# Patient Record
Sex: Female | Born: 1950
Health system: Southern US, Community
[De-identification: ages and names within clinical notes are randomized; demographics above are authoritative.]

## PROBLEM LIST (undated history)

## (undated) DIAGNOSIS — I2699 Other pulmonary embolism without acute cor pulmonale: Secondary | ICD-10-CM

## (undated) DIAGNOSIS — C801 Malignant (primary) neoplasm, unspecified: Secondary | ICD-10-CM

## (undated) DIAGNOSIS — Z9221 Personal history of antineoplastic chemotherapy: Secondary | ICD-10-CM

## (undated) DIAGNOSIS — C50919 Malignant neoplasm of unspecified site of unspecified female breast: Secondary | ICD-10-CM

## (undated) DIAGNOSIS — N200 Calculus of kidney: Secondary | ICD-10-CM

## (undated) DIAGNOSIS — M1990 Unspecified osteoarthritis, unspecified site: Secondary | ICD-10-CM

## (undated) DIAGNOSIS — I10 Essential (primary) hypertension: Secondary | ICD-10-CM

## (undated) DIAGNOSIS — Z923 Personal history of irradiation: Secondary | ICD-10-CM

## (undated) DIAGNOSIS — K219 Gastro-esophageal reflux disease without esophagitis: Secondary | ICD-10-CM

## (undated) DIAGNOSIS — E119 Type 2 diabetes mellitus without complications: Secondary | ICD-10-CM

## (undated) HISTORY — DX: Unspecified osteoarthritis, unspecified site: M19.90

## (undated) HISTORY — DX: Malignant (primary) neoplasm, unspecified: C80.1

## (undated) HISTORY — DX: Calculus of kidney: N20.0

## (undated) HISTORY — DX: Malignant neoplasm of unspecified site of unspecified female breast: C50.919

## (undated) HISTORY — DX: Essential (primary) hypertension: I10

## (undated) HISTORY — DX: Other pulmonary embolism without acute cor pulmonale: I26.99

## (undated) HISTORY — DX: Gastro-esophageal reflux disease without esophagitis: K21.9

---

## 2004-09-21 ENCOUNTER — Other Ambulatory Visit: Payer: Self-pay

## 2004-09-21 ENCOUNTER — Emergency Department: Payer: Self-pay | Admitting: Emergency Medicine

## 2009-09-09 ENCOUNTER — Other Ambulatory Visit: Payer: Self-pay

## 2009-10-10 ENCOUNTER — Ambulatory Visit: Payer: Self-pay | Admitting: Internal Medicine

## 2010-08-30 ENCOUNTER — Other Ambulatory Visit: Payer: Self-pay

## 2011-02-27 ENCOUNTER — Ambulatory Visit: Payer: Self-pay

## 2012-02-28 ENCOUNTER — Other Ambulatory Visit: Payer: Self-pay

## 2012-02-28 LAB — LIPID PANEL
Cholesterol: 168 mg/dL (ref 0–200)
Triglycerides: 68 mg/dL (ref 0–200)
VLDL Cholesterol, Calc: 14 mg/dL (ref 5–40)

## 2012-02-28 LAB — COMPREHENSIVE METABOLIC PANEL
Alkaline Phosphatase: 67 U/L (ref 50–136)
Anion Gap: 7 (ref 7–16)
BUN: 15 mg/dL (ref 7–18)
Bilirubin,Total: 0.6 mg/dL (ref 0.2–1.0)
Calcium, Total: 9.6 mg/dL (ref 8.5–10.1)
Chloride: 106 mmol/L (ref 98–107)
Co2: 29 mmol/L (ref 21–32)
EGFR (African American): >60
EGFR (Non-African Amer.): >60
Glucose: 107 mg/dL — ABNORMAL HIGH (ref 65–99)
SGPT (ALT): 30 U/L (ref 12–78)
Sodium: 142 mmol/L (ref 136–145)

## 2012-02-28 LAB — CBC WITH DIFFERENTIAL/PLATELET
Basophil #: 0 10*3/uL (ref 0.0–0.1)
Basophil %: 1 %
Eosinophil #: 0 10*3/uL (ref 0.0–0.7)
Eosinophil %: 1.2 %
HGB: 14.4 g/dL (ref 12.0–16.0)
Lymphocyte %: 47.8 %
MCH: 30 pg (ref 26.0–34.0)
MCHC: 34.9 g/dL (ref 32.0–36.0)
Monocyte #: 0.3 x10 3/mm (ref 0.2–0.9)
Neutrophil %: 41.7 %
Platelet: 212 10*3/uL (ref 150–440)
RBC: 4.81 10*6/uL (ref 3.80–5.20)

## 2012-02-28 LAB — TSH: Thyroid Stimulating Horm: 1.25 u[IU]/mL

## 2012-03-02 ENCOUNTER — Ambulatory Visit: Payer: Self-pay

## 2012-03-16 ENCOUNTER — Ambulatory Visit: Payer: Self-pay

## 2013-03-22 ENCOUNTER — Ambulatory Visit: Payer: Self-pay | Admitting: Internal Medicine

## 2013-03-25 ENCOUNTER — Other Ambulatory Visit: Payer: Self-pay

## 2013-03-25 LAB — COMPREHENSIVE METABOLIC PANEL
Albumin: 3.8 g/dL (ref 3.4–5.0)
BUN: 16 mg/dL (ref 7–18)
Calcium, Total: 9.3 mg/dL (ref 8.5–10.1)
Chloride: 107 mmol/L (ref 98–107)
Co2: 28 mmol/L (ref 21–32)
Creatinine: 0.95 mg/dL (ref 0.60–1.30)
EGFR (African American): >60
Potassium: 3.5 mmol/L (ref 3.5–5.1)
Sodium: 140 mmol/L (ref 136–145)
Total Protein: 8.2 g/dL (ref 6.4–8.2)

## 2013-03-25 LAB — CBC WITH DIFFERENTIAL/PLATELET
Basophil #: 0 10*3/uL (ref 0.0–0.1)
Basophil %: 0.8 %
Eosinophil #: 0 10*3/uL (ref 0.0–0.7)
HGB: 14.6 g/dL (ref 12.0–16.0)
Lymphocyte #: 2.2 10*3/uL (ref 1.0–3.6)
MCV: 85 fL (ref 80–100)
Monocyte #: 0.4 x10 3/mm (ref 0.2–0.9)
Neutrophil #: 2.1 10*3/uL (ref 1.4–6.5)
Neutrophil %: 43.9 %
RBC: 5.12 10*6/uL (ref 3.80–5.20)
RDW: 13.5 % (ref 11.5–14.5)
WBC: 4.7 10*3/uL (ref 3.6–11.0)

## 2013-03-25 LAB — LIPID PANEL: Ldl Cholesterol, Calc: 110 mg/dL — ABNORMAL HIGH (ref 0–100)

## 2013-03-25 LAB — TSH: Thyroid Stimulating Horm: 0.967 u[IU]/mL

## 2013-03-30 ENCOUNTER — Ambulatory Visit: Payer: Self-pay | Admitting: Internal Medicine

## 2013-04-05 ENCOUNTER — Ambulatory Visit: Payer: Self-pay

## 2013-04-05 HISTORY — PX: BREAST BIOPSY: SHX20

## 2013-04-08 ENCOUNTER — Encounter: Payer: Self-pay | Admitting: General Surgery

## 2013-04-08 ENCOUNTER — Ambulatory Visit (INDEPENDENT_AMBULATORY_CARE_PROVIDER_SITE_OTHER): Payer: 59 | Admitting: General Surgery

## 2013-04-08 VITALS — BP 150/92 | HR 80 | Resp 14 | Ht 61.0 in | Wt 173.0 lb

## 2013-04-08 DIAGNOSIS — C50911 Malignant neoplasm of unspecified site of right female breast: Secondary | ICD-10-CM

## 2013-04-08 DIAGNOSIS — C50919 Malignant neoplasm of unspecified site of unspecified female breast: Secondary | ICD-10-CM

## 2013-04-08 NOTE — Progress Notes (Signed)
Patient ID: Abigail Wheeler, female   DOB: 1951-03-05, 62 y.o.   MRN: [id number]  Chief Complaint  Patient presents with  . Other    mammogram    HPI Abigail Wheeler is a 62 y.o. female. who presents for a breast evaluation. The most recent mammogram was 03-22-13, ultrasound was 03-30-13 and breast biopsy was done on 04-05-13.  Patient does perform regular self breast checks and gets regular mammograms done.  She states she could not feel anything different in the breast during her routine checks. The patient states they called her this morning and said the biopsy came back as cancer.   HPI  Past Medical History  Diagnosis Date  . Hypertension   . GERD (gastroesophageal reflux disease)     Past Surgical History  Procedure Laterality Date  . Cesarean section  1991  . Breast biopsy Right 04-05-13    Family History  Problem Relation Age of Onset  . Lung cancer Father   . Breast cancer Maternal Grandmother 20    Social History History  Substance Use Topics  . Smoking status: Never Smoker   . Smokeless tobacco: Never Used  . Alcohol Use: No    No Known Allergies  Current Outpatient Prescriptions  Medication Sig Dispense Refill  . lisinopril (PRINIVIL,ZESTRIL) 10 MG tablet Take 10 mg by mouth daily.      Marland Kitchen omeprazole (PRILOSEC) 20 MG capsule Take 20 mg by mouth daily.       No current facility-administered medications for this visit.    Review of Systems Review of Systems  Constitutional: Negative.   Respiratory: Negative.   Cardiovascular: Negative.     Blood pressure 150/92, pulse 80, resp. rate 14, height 5\' 1"  (1.549 m), weight 173 lb (78.472 kg).  Physical Exam Physical Exam  Constitutional: She is oriented to person, place, and time. She appears well-developed and well-nourished.  Eyes: Conjunctivae are normal. No scleral icterus.  Neck: Neck supple.  Cardiovascular: Normal rate, regular rhythm and normal heart sounds.   Pulmonary/Chest: Effort normal  and breath sounds normal. Right breast exhibits no inverted nipple, no mass, no nipple discharge, no skin change and no tenderness. Left breast exhibits no inverted nipple, no mass, no nipple discharge, no skin change and no tenderness.  Abdominal: Soft. Normal appearance. There is no hepatosplenomegaly. There is no tenderness. No hernia.  Lymphadenopathy:    She has no cervical adenopathy.    She has no axillary adenopathy.  Neurological: She is alert and oriented to person, place, and time.  Skin: Skin is warm and dry.    Data Reviewed Mammogram reviewed. Path shows invasive mammary CA. Based on mammogram from her biopsy, the clip is 2-3 cm from the spiculated area in right breast uiq.   Assessment    CA right breast. Not clear if the targeted area was actually biopsied. May need localization of clip and the spiculated density and both areas removed.    Plan    Will discuss this with radiologist and advise pt. Brief discussed cancer management. She will be advised once I have additional info from Dr. Roswell Nickel from radiology.        SANKAR,SEEPLAPUTHUR G 04/09/2013, 6:51 AM

## 2013-04-08 NOTE — Patient Instructions (Addendum)
Call office for any new breast issues or concerns. She will be contacted early next week for full discussion on further management

## 2013-04-09 ENCOUNTER — Encounter: Payer: Self-pay | Admitting: General Surgery

## 2013-04-09 DIAGNOSIS — C50919 Malignant neoplasm of unspecified site of unspecified female breast: Secondary | ICD-10-CM | POA: Insufficient documentation

## 2013-04-09 LAB — PATHOLOGY REPORT

## 2013-04-13 ENCOUNTER — Encounter: Payer: Self-pay | Admitting: General Surgery

## 2013-04-13 ENCOUNTER — Ambulatory Visit (INDEPENDENT_AMBULATORY_CARE_PROVIDER_SITE_OTHER): Payer: 59 | Admitting: General Surgery

## 2013-04-13 VITALS — BP 154/80 | HR 82 | Resp 14 | Ht 64.0 in | Wt 174.0 lb

## 2013-04-13 DIAGNOSIS — C50219 Malignant neoplasm of upper-inner quadrant of unspecified female breast: Secondary | ICD-10-CM

## 2013-04-13 DIAGNOSIS — C50211 Malignant neoplasm of upper-inner quadrant of right female breast: Secondary | ICD-10-CM

## 2013-04-13 NOTE — Progress Notes (Signed)
Here today for breast discussion. Right breast biopsy site healed, no signs of infection.  After discussion with Dr. Roswell Nickel from radiology it was decided to get an MRI to assess the actual size of the cancer in her right breast. The ER is pos, PR is neg and Her 2 is neg. Discussed fully with pt. She is claustrophobic but was willing to have it done with mild sedative. Rx given for Valium 5 mg to take before the test.  Patient to be scheduled for a bilateral breast MRI at the [facility]. Their facility will contact patient will date, time, and instructions.

## 2013-04-14 ENCOUNTER — Encounter: Payer: Self-pay | Admitting: General Surgery

## 2013-04-14 NOTE — Patient Instructions (Signed)
Surgery to be scheduled after MRI reviewed.

## 2013-04-19 ENCOUNTER — Telehealth: Payer: Self-pay | Admitting: *Deleted

## 2013-04-19 NOTE — Telephone Encounter (Signed)
Patient was contacted today because the [facility] has not been able to contact the patient. This patient states if she had received a call they did not leave a message.  This patient was given the number to the Breast Center to call and get breast MRI arranged [phone]). She verbalizes understanding.

## 2013-04-22 DIAGNOSIS — Z923 Personal history of irradiation: Secondary | ICD-10-CM

## 2013-04-22 DIAGNOSIS — C50919 Malignant neoplasm of unspecified site of unspecified female breast: Secondary | ICD-10-CM

## 2013-04-22 DIAGNOSIS — Z9221 Personal history of antineoplastic chemotherapy: Secondary | ICD-10-CM

## 2013-04-22 HISTORY — DX: Personal history of irradiation: Z92.3

## 2013-04-22 HISTORY — PX: MASTECTOMY: SHX3

## 2013-04-22 HISTORY — DX: Personal history of antineoplastic chemotherapy: Z92.21

## 2013-04-22 HISTORY — DX: Malignant neoplasm of unspecified site of unspecified female breast: C50.919

## 2013-04-23 ENCOUNTER — Ambulatory Visit
Admission: RE | Admit: 2013-04-23 | Discharge: 2013-04-23 | Disposition: A | Discharge status: Home / Self Care | Payer: 59 | Source: Ambulatory Visit | Attending: General Surgery | Admitting: General Surgery

## 2013-04-23 MED ORDER — GADOBENATE DIMEGLUMINE 529 MG/ML IV SOLN
15.0000 mL | Freq: Once | INTRAVENOUS | Status: AC | PRN
  Administered 2013-04-23: 15 mL via INTRAVENOUS

## 2013-04-27 ENCOUNTER — Telehealth: Payer: Self-pay | Admitting: *Deleted

## 2013-04-27 NOTE — Telephone Encounter (Signed)
Message left for Abigail Wheeler at the [facility].   Dr. Jamal Collin reviewed MRI report and would like for patient to have right breast MRI guided biopsy. Patient is anxious and wishes to proceed ASAP.

## 2013-04-28 ENCOUNTER — Other Ambulatory Visit: Payer: Self-pay | Admitting: General Surgery

## 2013-04-28 ENCOUNTER — Other Ambulatory Visit: Payer: Self-pay

## 2013-04-28 DIAGNOSIS — R928 Other abnormal and inconclusive findings on diagnostic imaging of breast: Secondary | ICD-10-CM

## 2013-04-28 NOTE — Telephone Encounter (Signed)
Per Juliann Pulse at the [facility], she will contact patient and arrange accordingly. The first available date is 05-05-13 if patient can do that day.   Dr. Jamal Collin notified to sign off on order for MRI guided biopsy and post mammogram to confirm clip placement.

## 2013-04-29 NOTE — Telephone Encounter (Signed)
Per Juliann Pulse at the [facility], she has scheduled patient for MRI guided biopsy for 05-05-13 at 7:15 am.

## 2013-05-05 ENCOUNTER — Inpatient Hospital Stay: Admission: RE | Admit: 2013-05-05 | Payer: 59 | Source: Ambulatory Visit

## 2013-05-06 ENCOUNTER — Ambulatory Visit
Admission: RE | Admit: 2013-05-06 | Discharge: 2013-05-06 | Disposition: A | Discharge status: Home / Self Care | Payer: 59 | Source: Ambulatory Visit | Attending: General Surgery | Admitting: General Surgery

## 2013-05-06 DIAGNOSIS — R928 Other abnormal and inconclusive findings on diagnostic imaging of breast: Secondary | ICD-10-CM

## 2013-05-06 MED ORDER — GADOBENATE DIMEGLUMINE 529 MG/ML IV SOLN
16.0000 mL | Freq: Once | INTRAVENOUS | Status: AC | PRN
  Administered 2013-05-06: 16 mL via INTRAVENOUS

## 2013-05-10 ENCOUNTER — Ambulatory Visit (INDEPENDENT_AMBULATORY_CARE_PROVIDER_SITE_OTHER): Payer: 59 | Admitting: General Surgery

## 2013-05-10 DIAGNOSIS — C50919 Malignant neoplasm of unspecified site of unspecified female breast: Secondary | ICD-10-CM

## 2013-05-10 DIAGNOSIS — C50911 Malignant neoplasm of unspecified site of right female breast: Secondary | ICD-10-CM

## 2013-05-11 ENCOUNTER — Telehealth: Payer: Self-pay | Admitting: *Deleted

## 2013-05-11 ENCOUNTER — Encounter: Payer: Self-pay | Admitting: General Surgery

## 2013-05-11 DIAGNOSIS — C50911 Malignant neoplasm of unspecified site of right female breast: Secondary | ICD-10-CM | POA: Insufficient documentation

## 2013-05-11 NOTE — Progress Notes (Signed)
Patient ID: Abigail Wheeler, female   DOB: February 06, 1951, 63 y.o.   MRN: [id number] Pt underwent MRI guided biopsy of a lesion in right breast UIQ in anterior location. Path showed invasive CA and DCIS. To date she has 2 areas of cancer in posterior location and one in anterior location -all in UIQ of right breast. It appears there is multifocal disease. Pt was advised fully on this. Best option is to do mastectomy with node biopsy. Would not consider breast reconstruction at the same time since final staging and possible need for chemo/radiation is uncertain till mastectomy is completed. Alternative is to do quadrentectomy with node biopsy but margin may be an issue. After discussion pt is agreeable to mastectomy. Procedure , risks/benfits explained.

## 2013-05-11 NOTE — Patient Instructions (Signed)
Surgery to be scheduled. ER/PR on most recent biopsy is pending.

## 2013-05-11 NOTE — Telephone Encounter (Signed)
Patient's surgery has been scheduled for 05-19-13 at [facility]. This patient's husband has been notified of all the details. He was instructed to have wife call only if she has further questions.

## 2013-05-12 ENCOUNTER — Other Ambulatory Visit: Payer: Self-pay | Admitting: General Surgery

## 2013-05-12 ENCOUNTER — Telehealth: Payer: Self-pay | Admitting: *Deleted

## 2013-05-12 DIAGNOSIS — C50911 Malignant neoplasm of unspecified site of right female breast: Secondary | ICD-10-CM

## 2013-05-12 DIAGNOSIS — C50219 Malignant neoplasm of upper-inner quadrant of unspecified female breast: Secondary | ICD-10-CM

## 2013-05-12 NOTE — Addendum Note (Signed)
Addended by: Christene Lye on: 05/12/2013 12:45 PM   Modules accepted: Orders

## 2013-05-12 NOTE — Telephone Encounter (Signed)
Patient's husband notified that this patient will need to have labs drawn at [facility] lab this afternoon.   Results will be forwarded to [facility] in [place] once available.   Patient's surgery is scheduled for 05-19-13 at [facility].

## 2013-05-13 ENCOUNTER — Ambulatory Visit: Payer: Self-pay | Admitting: Anesthesiology

## 2013-05-13 LAB — CBC WITH DIFFERENTIAL
Basophils Absolute: 0 10*3/uL (ref 0.0–0.2)
Basos: 0 %
Eos: 1 %
Eosinophils Absolute: 0.1 10*3/uL (ref 0.0–0.4)
HCT: 42.7 % (ref 34.0–46.6)
Hemoglobin: 14.2 g/dL (ref 11.1–15.9)
IMMATURE GRANS (ABS): 0 10*3/uL (ref 0.0–0.1)
Immature Granulocytes: 0 %
LYMPHS ABS: 2.8 10*3/uL (ref 0.7–3.1)
Lymphs: 53 %
MCH: 28.2 pg (ref 26.6–33.0)
MCHC: 33.3 g/dL (ref 31.5–35.7)
MCV: 85 fL (ref 79–97)
MONOCYTES: 8 %
Monocytes Absolute: 0.4 10*3/uL (ref 0.1–0.9)
NEUTROS PCT: 38 %
Neutrophils Absolute: 2 10*3/uL (ref 1.4–7.0)
PLATELETS: 246 10*3/uL (ref 150–379)
RBC: 5.03 x10E6/uL (ref 3.77–5.28)
RDW: 13.8 % (ref 12.3–15.4)
WBC: 5.3 10*3/uL (ref 3.4–10.8)

## 2013-05-13 LAB — COMPREHENSIVE METABOLIC PANEL
ALBUMIN: 4.4 g/dL (ref 3.6–4.8)
ALT: 21 IU/L (ref 0–32)
AST: 16 IU/L (ref 0–40)
Albumin/Globulin Ratio: 1.4 (ref 1.1–2.5)
Alkaline Phosphatase: 87 IU/L (ref 39–117)
BUN/Creatinine Ratio: 13 (ref 11–26)
BUN: 14 mg/dL (ref 8–27)
CALCIUM: 10.2 mg/dL (ref 8.7–10.3)
CO2: 25 mmol/L (ref 18–29)
CREATININE: 1.12 mg/dL — AB (ref 0.57–1.00)
Chloride: 101 mmol/L (ref 97–108)
GFR calc Af Amer: 60 mL/min/{1.73_m2} (ref >59)
GFR calc non Af Amer: 52 mL/min/{1.73_m2} — ABNORMAL LOW (ref >59)
GLOBULIN, TOTAL: 3.1 g/dL (ref 1.5–4.5)
Glucose: 105 mg/dL — ABNORMAL HIGH (ref 65–99)
Potassium: 4 mmol/L (ref 3.5–5.2)
Sodium: 142 mmol/L (ref 134–144)
Total Bilirubin: 0.4 mg/dL (ref 0.0–1.2)
Total Protein: 7.5 g/dL (ref 6.0–8.5)

## 2013-05-13 LAB — CANCER ANTIGEN 27.29: CA 27.29: 11.7 U/mL (ref 0.0–38.6)

## 2013-05-18 ENCOUNTER — Encounter: Payer: Self-pay | Admitting: General Surgery

## 2013-05-19 ENCOUNTER — Ambulatory Visit: Payer: Self-pay | Admitting: General Surgery

## 2013-05-19 DIAGNOSIS — C50219 Malignant neoplasm of upper-inner quadrant of unspecified female breast: Secondary | ICD-10-CM

## 2013-05-20 ENCOUNTER — Encounter: Payer: Self-pay | Admitting: General Surgery

## 2013-05-20 DIAGNOSIS — C801 Malignant (primary) neoplasm, unspecified: Secondary | ICD-10-CM

## 2013-05-20 HISTORY — DX: Malignant (primary) neoplasm, unspecified: C80.1

## 2013-05-20 HISTORY — PX: BREAST SURGERY: SHX581

## 2013-05-24 ENCOUNTER — Encounter: Payer: Self-pay | Admitting: General Surgery

## 2013-05-24 ENCOUNTER — Ambulatory Visit (INDEPENDENT_AMBULATORY_CARE_PROVIDER_SITE_OTHER): Payer: 59 | Admitting: General Surgery

## 2013-05-24 VITALS — BP 160/84 | HR 78 | Resp 14 | Ht 61.0 in | Wt 168.0 lb

## 2013-05-24 DIAGNOSIS — C50219 Malignant neoplasm of upper-inner quadrant of unspecified female breast: Secondary | ICD-10-CM

## 2013-05-24 NOTE — Progress Notes (Signed)
This is a 63 year old female here today for her post op right mastectomy done on 05/20/13. Patient wants here drained check. Incision is clean and healing well. The drainage is still over 30 daily. The bulb on drain was changed. Path report pending. F/U in 1 week.

## 2013-05-24 NOTE — Patient Instructions (Signed)
Patient to return in one week. 

## 2013-05-25 ENCOUNTER — Encounter: Payer: Self-pay | Admitting: General Surgery

## 2013-05-25 LAB — PATHOLOGY REPORT

## 2013-05-28 ENCOUNTER — Telehealth: Payer: Self-pay | Admitting: General Surgery

## 2013-05-28 NOTE — Telephone Encounter (Signed)
Pt advised of final path report. Pt did have micro met in one SN, The posterior most lesion was largest at 1.7cm and did involve muscle-margin is clear however. This was discussed at tumor case conference on 05/27/13 and consensus is to proceed with chemo and radiation. Pt advised on this.  She is agreeable to oncology consult. She reports doing well from her mastectomy. Drainage yesterday was 30 ml total.  She has f/u on 2/10 here.

## 2013-05-31 ENCOUNTER — Encounter: Payer: Self-pay | Admitting: General Surgery

## 2013-05-31 NOTE — Telephone Encounter (Signed)
Patient has been scheduled for an appointment with Dr. Kallie Edward and Dr. Baruch Gouty at the [facility] for 06-04-13 at 10 am. She is aware of date, time, and instructions.

## 2013-06-01 ENCOUNTER — Ambulatory Visit (INDEPENDENT_AMBULATORY_CARE_PROVIDER_SITE_OTHER): Payer: 59 | Admitting: General Surgery

## 2013-06-01 ENCOUNTER — Encounter: Payer: Self-pay | Admitting: General Surgery

## 2013-06-01 VITALS — BP 150/80 | HR 76 | Resp 14 | Ht 61.0 in | Wt 170.0 lb

## 2013-06-01 DIAGNOSIS — C50219 Malignant neoplasm of upper-inner quadrant of unspecified female breast: Secondary | ICD-10-CM

## 2013-06-01 NOTE — Patient Instructions (Addendum)
Discussed power port.  Patient is scheduled for a Port Placement at [facility] for 06/10/13. Patient is aware of date and instructions. She will pre admit by phone.

## 2013-06-01 NOTE — Progress Notes (Signed)
This is a 63 year old female here today for her second post op right mastectomy done on 05/20/13. Drainage less than 30 ml daily for last 3 days. Drain  removed. Incision is well healed.  Path showed micro met in one SN. Also there was focal muscle involvement , margins are clear. Advised on all of this -she needs chemo and radiation. Discussed oncology consultation and port placement.   Patient is scheduled for a Port Placement at [facility] for 06/10/13. Patient is aware of date and instructions. She will pre admit by phone.

## 2013-06-03 ENCOUNTER — Ambulatory Visit: Payer: Self-pay | Admitting: Hematology and Oncology

## 2013-06-10 ENCOUNTER — Ambulatory Visit: Payer: Self-pay | Admitting: General Surgery

## 2013-06-10 DIAGNOSIS — C50219 Malignant neoplasm of upper-inner quadrant of unspecified female breast: Secondary | ICD-10-CM

## 2013-06-11 ENCOUNTER — Encounter: Payer: Self-pay | Admitting: General Surgery

## 2013-06-13 ENCOUNTER — Encounter: Payer: Self-pay | Admitting: General Surgery

## 2013-06-14 ENCOUNTER — Ambulatory Visit: Payer: Self-pay | Admitting: Hematology and Oncology

## 2013-06-18 LAB — COMPREHENSIVE METABOLIC PANEL
ANION GAP: 10 (ref 7–16)
Albumin: 3.4 g/dL (ref 3.4–5.0)
Alkaline Phosphatase: 79 U/L
BILIRUBIN TOTAL: 0.2 mg/dL (ref 0.2–1.0)
BUN: 11 mg/dL (ref 7–18)
Calcium, Total: 8.4 mg/dL — ABNORMAL LOW (ref 8.5–10.1)
Chloride: 106 mmol/L (ref 98–107)
Co2: 28 mmol/L (ref 21–32)
Creatinine: 0.91 mg/dL (ref 0.60–1.30)
EGFR (African American): >60
Glucose: 164 mg/dL — ABNORMAL HIGH (ref 65–99)
OSMOLALITY: 290 (ref 275–301)
POTASSIUM: 3.2 mmol/L — AB (ref 3.5–5.1)
SGOT(AST): 15 U/L (ref 15–37)
SGPT (ALT): 29 U/L (ref 12–78)
SODIUM: 144 mmol/L (ref 136–145)
TOTAL PROTEIN: 7.5 g/dL (ref 6.4–8.2)

## 2013-06-18 LAB — CBC CANCER CENTER
Basophil #: 0 x10 3/mm (ref 0.0–0.1)
Basophil %: 0.9 %
Eosinophil #: 0.1 x10 3/mm (ref 0.0–0.7)
Eosinophil %: 1 %
HCT: 39.4 % (ref 35.0–47.0)
HGB: 13.1 g/dL (ref 12.0–16.0)
LYMPHS ABS: 2.4 x10 3/mm (ref 1.0–3.6)
Lymphocyte %: 43.9 %
MCH: 28.6 pg (ref 26.0–34.0)
MCHC: 33.2 g/dL (ref 32.0–36.0)
MCV: 86 fL (ref 80–100)
MONO ABS: 0.5 x10 3/mm (ref 0.2–0.9)
MONOS PCT: 8.8 %
Neutrophil #: 2.5 x10 3/mm (ref 1.4–6.5)
Neutrophil %: 45.4 %
PLATELETS: 195 x10 3/mm (ref 150–440)
RBC: 4.59 10*6/uL (ref 3.80–5.20)
RDW: 13.4 % (ref 11.5–14.5)
WBC: 5.4 x10 3/mm (ref 3.6–11.0)

## 2013-06-20 ENCOUNTER — Ambulatory Visit: Payer: Self-pay | Admitting: Hematology and Oncology

## 2013-06-25 LAB — CBC CANCER CENTER
Basophil #: 0 x10 3/mm (ref 0.0–0.1)
Basophil %: 0.6 %
Eosinophil #: 0 x10 3/mm (ref 0.0–0.7)
Eosinophil %: 0.5 %
HCT: 36.8 % (ref 35.0–47.0)
HGB: 12.1 g/dL (ref 12.0–16.0)
LYMPHS ABS: 1.5 x10 3/mm (ref 1.0–3.6)
LYMPHS PCT: 42.4 %
MCH: 28.3 pg (ref 26.0–34.0)
MCHC: 33 g/dL (ref 32.0–36.0)
MCV: 86 fL (ref 80–100)
MONO ABS: 0 x10 3/mm — AB (ref 0.2–0.9)
MONOS PCT: 0.6 %
NEUTROS PCT: 55.9 %
Neutrophil #: 1.9 x10 3/mm (ref 1.4–6.5)
Platelet: 125 x10 3/mm — ABNORMAL LOW (ref 150–440)
RBC: 4.29 10*6/uL (ref 3.80–5.20)
RDW: 13.1 % (ref 11.5–14.5)
WBC: 3.5 x10 3/mm — ABNORMAL LOW (ref 3.6–11.0)

## 2013-07-02 LAB — CBC CANCER CENTER
BASOS ABS: 0 x10 3/mm (ref 0.0–0.1)
Basophil %: 0.5 %
EOS ABS: 0 x10 3/mm (ref 0.0–0.7)
EOS PCT: 0.3 %
HCT: 33.9 % — ABNORMAL LOW (ref 35.0–47.0)
HGB: 11.3 g/dL — ABNORMAL LOW (ref 12.0–16.0)
Lymphocyte #: 1.5 x10 3/mm (ref 1.0–3.6)
Lymphocyte %: 72.4 %
MCH: 28.3 pg (ref 26.0–34.0)
MCHC: 33.2 g/dL (ref 32.0–36.0)
MCV: 85 fL (ref 80–100)
MONO ABS: 0.3 x10 3/mm (ref 0.2–0.9)
MONOS PCT: 16.9 %
NEUTROS ABS: 0.2 x10 3/mm — AB (ref 1.4–6.5)
Neutrophil %: 9.9 %
PLATELETS: 247 x10 3/mm (ref 150–440)
RBC: 3.99 10*6/uL (ref 3.80–5.20)
RDW: 13.1 % (ref 11.5–14.5)
WBC: 2 x10 3/mm — AB (ref 3.6–11.0)

## 2013-07-08 LAB — CBC CANCER CENTER
Basophil #: 0 x10 3/mm (ref 0.0–0.1)
Basophil %: 0.9 %
EOS ABS: 0 x10 3/mm (ref 0.0–0.7)
Eosinophil %: 0.1 %
HCT: 35.9 % (ref 35.0–47.0)
HGB: 12.1 g/dL (ref 12.0–16.0)
LYMPHS PCT: 42.1 %
Lymphocyte #: 1.8 x10 3/mm (ref 1.0–3.6)
MCH: 28.5 pg (ref 26.0–34.0)
MCHC: 33.6 g/dL (ref 32.0–36.0)
MCV: 85 fL (ref 80–100)
MONO ABS: 0.6 x10 3/mm (ref 0.2–0.9)
Monocyte %: 13.8 %
Neutrophil #: 1.8 x10 3/mm (ref 1.4–6.5)
Neutrophil %: 43.1 %
Platelet: 373 x10 3/mm (ref 150–440)
RBC: 4.23 10*6/uL (ref 3.80–5.20)
RDW: 13.5 % (ref 11.5–14.5)
WBC: 4.2 x10 3/mm (ref 3.6–11.0)

## 2013-07-08 LAB — BASIC METABOLIC PANEL
Anion Gap: 9 (ref 7–16)
BUN: 10 mg/dL (ref 7–18)
CO2: 28 mmol/L (ref 21–32)
Calcium, Total: 9 mg/dL (ref 8.5–10.1)
Chloride: 105 mmol/L (ref 98–107)
Creatinine: 0.89 mg/dL (ref 0.60–1.30)
EGFR (African American): >60
EGFR (Non-African Amer.): >60
Glucose: 109 mg/dL — ABNORMAL HIGH (ref 65–99)
OSMOLALITY: 283 (ref 275–301)
Potassium: 3.2 mmol/L — ABNORMAL LOW (ref 3.5–5.1)
Sodium: 142 mmol/L (ref 136–145)

## 2013-07-15 LAB — CBC CANCER CENTER
Basophil #: 0 x10 3/mm (ref 0.0–0.1)
Basophil %: 1 %
EOS ABS: 0 x10 3/mm (ref 0.0–0.7)
Eosinophil %: 0.2 %
HCT: 35.7 % (ref 35.0–47.0)
HGB: 11.7 g/dL — AB (ref 12.0–16.0)
LYMPHS PCT: 47.6 %
Lymphocyte #: 0.9 x10 3/mm — ABNORMAL LOW (ref 1.0–3.6)
MCH: 27.9 pg (ref 26.0–34.0)
MCHC: 32.6 g/dL (ref 32.0–36.0)
MCV: 86 fL (ref 80–100)
MONO ABS: 0.1 x10 3/mm — AB (ref 0.2–0.9)
Monocyte %: 6.1 %
NEUTROS ABS: 0.9 x10 3/mm — AB (ref 1.4–6.5)
Neutrophil %: 45.1 %
PLATELETS: 101 x10 3/mm — AB (ref 150–440)
RBC: 4.17 10*6/uL (ref 3.80–5.20)
RDW: 13.7 % (ref 11.5–14.5)
WBC: 1.9 x10 3/mm — CL (ref 3.6–11.0)

## 2013-07-21 ENCOUNTER — Ambulatory Visit: Payer: Self-pay | Admitting: Hematology and Oncology

## 2013-07-22 LAB — CBC CANCER CENTER
BASOS ABS: 0 x10 3/mm (ref 0.0–0.1)
BASOS PCT: 0.4 %
EOS ABS: 0 x10 3/mm (ref 0.0–0.7)
Eosinophil %: 0.1 %
HCT: 34.3 % — ABNORMAL LOW (ref 35.0–47.0)
HGB: 11.2 g/dL — ABNORMAL LOW (ref 12.0–16.0)
Lymphocyte #: 1.9 x10 3/mm (ref 1.0–3.6)
Lymphocyte %: 26 %
MCH: 28.3 pg (ref 26.0–34.0)
MCHC: 32.8 g/dL (ref 32.0–36.0)
MCV: 86 fL (ref 80–100)
Monocyte #: 1 x10 3/mm — ABNORMAL HIGH (ref 0.2–0.9)
Monocyte %: 13.9 %
NEUTROS PCT: 59.6 %
Neutrophil #: 4.4 x10 3/mm (ref 1.4–6.5)
PLATELETS: 181 x10 3/mm (ref 150–440)
RBC: 3.98 10*6/uL (ref 3.80–5.20)
RDW: 13.8 % (ref 11.5–14.5)
WBC: 7.4 x10 3/mm (ref 3.6–11.0)

## 2013-07-29 LAB — BASIC METABOLIC PANEL
ANION GAP: 8 (ref 7–16)
BUN: 9 mg/dL (ref 7–18)
Calcium, Total: 9.2 mg/dL (ref 8.5–10.1)
Chloride: 105 mmol/L (ref 98–107)
Co2: 28 mmol/L (ref 21–32)
Creatinine: 0.95 mg/dL (ref 0.60–1.30)
EGFR (Non-African Amer.): >60
Glucose: 181 mg/dL — ABNORMAL HIGH (ref 65–99)
Osmolality: 285 (ref 275–301)
Potassium: 3.4 mmol/L — ABNORMAL LOW (ref 3.5–5.1)
Sodium: 141 mmol/L (ref 136–145)

## 2013-07-29 LAB — CBC CANCER CENTER
BASOS ABS: 0 x10 3/mm (ref 0.0–0.1)
Basophil %: 1.1 %
EOS ABS: 0 x10 3/mm (ref 0.0–0.7)
EOS PCT: 0.2 %
HCT: 35.8 % (ref 35.0–47.0)
HGB: 11.9 g/dL — AB (ref 12.0–16.0)
LYMPHS ABS: 1.3 x10 3/mm (ref 1.0–3.6)
LYMPHS PCT: 31.4 %
MCH: 28.7 pg (ref 26.0–34.0)
MCHC: 33.2 g/dL (ref 32.0–36.0)
MCV: 86 fL (ref 80–100)
MONO ABS: 0.7 x10 3/mm (ref 0.2–0.9)
Monocyte %: 15.8 %
NEUTROS ABS: 2.2 x10 3/mm (ref 1.4–6.5)
NEUTROS PCT: 51.5 %
Platelet: 403 x10 3/mm (ref 150–440)
RBC: 4.16 10*6/uL (ref 3.80–5.20)
RDW: 15.6 % — AB (ref 11.5–14.5)
WBC: 4.2 x10 3/mm (ref 3.6–11.0)

## 2013-08-05 LAB — CBC CANCER CENTER
BASOS ABS: 0 x10 3/mm (ref 0.0–0.1)
Basophil %: 0.9 %
Eosinophil #: 0 x10 3/mm (ref 0.0–0.7)
Eosinophil %: 0.1 %
HCT: 35.8 % (ref 35.0–47.0)
HGB: 11.9 g/dL — AB (ref 12.0–16.0)
Lymphocyte #: 0.7 x10 3/mm — ABNORMAL LOW (ref 1.0–3.6)
Lymphocyte %: 15.5 %
MCH: 28.9 pg (ref 26.0–34.0)
MCHC: 33.2 g/dL (ref 32.0–36.0)
MCV: 87 fL (ref 80–100)
MONO ABS: 0 x10 3/mm — AB (ref 0.2–0.9)
Monocyte %: 0.7 %
Neutrophil #: 3.5 x10 3/mm (ref 1.4–6.5)
Neutrophil %: 82.8 %
Platelet: 177 x10 3/mm (ref 150–440)
RBC: 4.1 10*6/uL (ref 3.80–5.20)
RDW: 16 % — AB (ref 11.5–14.5)
WBC: 4.2 x10 3/mm (ref 3.6–11.0)

## 2013-08-08 ENCOUNTER — Inpatient Hospital Stay: Payer: Self-pay | Admitting: Internal Medicine

## 2013-08-08 DIAGNOSIS — I2699 Other pulmonary embolism without acute cor pulmonale: Secondary | ICD-10-CM | POA: Insufficient documentation

## 2013-08-08 LAB — URINALYSIS, COMPLETE
BACTERIA: NONE SEEN
Bilirubin,UR: NEGATIVE
Glucose,UR: NEGATIVE mg/dL (ref 0–75)
Ketone: NEGATIVE
LEUKOCYTE ESTERASE: NEGATIVE
Nitrite: NEGATIVE
PH: 6 (ref 4.5–8.0)
PROTEIN: NEGATIVE
Specific Gravity: 1.025 (ref 1.003–1.030)
Squamous Epithelial: NONE SEEN

## 2013-08-08 LAB — COMPREHENSIVE METABOLIC PANEL
AST: 21 U/L (ref 15–37)
Albumin: 3.1 g/dL — ABNORMAL LOW (ref 3.4–5.0)
Alkaline Phosphatase: 70 U/L
Anion Gap: 7 (ref 7–16)
BILIRUBIN TOTAL: 0.4 mg/dL (ref 0.2–1.0)
BUN: 11 mg/dL (ref 7–18)
CHLORIDE: 102 mmol/L (ref 98–107)
CO2: 27 mmol/L (ref 21–32)
CREATININE: 0.88 mg/dL (ref 0.60–1.30)
Calcium, Total: 9.4 mg/dL (ref 8.5–10.1)
EGFR (Non-African Amer.): >60
Glucose: 181 mg/dL — ABNORMAL HIGH (ref 65–99)
OSMOLALITY: 276 (ref 275–301)
Potassium: 3.7 mmol/L (ref 3.5–5.1)
SGPT (ALT): 30 U/L (ref 12–78)
Sodium: 136 mmol/L (ref 136–145)
Total Protein: 7.8 g/dL (ref 6.4–8.2)

## 2013-08-08 LAB — DIFFERENTIAL
BASOS ABS: 0 10*3/uL (ref 0.0–0.1)
Basophil %: 0.9 %
EOS ABS: 0 10*3/uL (ref 0.0–0.7)
Eosinophil %: 0 %
LYMPHS ABS: 0.5 10*3/uL — AB (ref 1.0–3.6)
Lymphocyte %: 35 %
MONO ABS: 0.1 x10 3/mm — AB (ref 0.2–0.9)
Monocyte %: 7.6 %
NEUTROS ABS: 0.8 10*3/uL — AB (ref 1.4–6.5)
NEUTROS PCT: 56.5 %

## 2013-08-08 LAB — TROPONIN I: Troponin-I: <0.02 ng/mL

## 2013-08-08 LAB — CBC
HCT: 28.3 % — AB (ref 35.0–47.0)
HGB: 9.4 g/dL — ABNORMAL LOW (ref 12.0–16.0)
MCH: 28.7 pg (ref 26.0–34.0)
MCHC: 33.1 g/dL (ref 32.0–36.0)
MCV: 87 fL (ref 80–100)
PLATELETS: 83 10*3/uL — AB (ref 150–440)
RBC: 3.27 10*6/uL — ABNORMAL LOW (ref 3.80–5.20)
RDW: 15.7 % — ABNORMAL HIGH (ref 11.5–14.5)
WBC: 1.4 10*3/uL — CL (ref 3.6–11.0)

## 2013-08-08 LAB — PROTIME-INR
INR: 1.1
PROTHROMBIN TIME: 14.3 s (ref 11.5–14.7)

## 2013-08-08 LAB — APTT: Activated PTT: 42.2 secs — ABNORMAL HIGH (ref 23.6–35.9)

## 2013-08-09 LAB — BASIC METABOLIC PANEL
ANION GAP: 4 — AB (ref 7–16)
BUN: 8 mg/dL (ref 7–18)
CALCIUM: 8.8 mg/dL (ref 8.5–10.1)
CHLORIDE: 108 mmol/L — AB (ref 98–107)
CO2: 26 mmol/L (ref 21–32)
CREATININE: 0.67 mg/dL (ref 0.60–1.30)
EGFR (Non-African Amer.): >60
Glucose: 113 mg/dL — ABNORMAL HIGH (ref 65–99)
Osmolality: 275 (ref 275–301)
Potassium: 3.6 mmol/L (ref 3.5–5.1)
Sodium: 138 mmol/L (ref 136–145)

## 2013-08-09 LAB — CBC WITH DIFFERENTIAL/PLATELET
BASOS ABS: 0 10*3/uL (ref 0.0–0.1)
BASOS PCT: 0.9 %
EOS ABS: 0 10*3/uL (ref 0.0–0.7)
Eosinophil %: 0.4 %
HCT: 24.8 % — ABNORMAL LOW (ref 35.0–47.0)
HGB: 8.4 g/dL — ABNORMAL LOW (ref 12.0–16.0)
Lymphocyte #: 0.6 10*3/uL — ABNORMAL LOW (ref 1.0–3.6)
Lymphocyte %: 48.4 %
MCH: 29 pg (ref 26.0–34.0)
MCHC: 33.8 g/dL (ref 32.0–36.0)
MCV: 86 fL (ref 80–100)
MONO ABS: 0.1 x10 3/mm — AB (ref 0.2–0.9)
Monocyte %: 11 %
Neutrophil #: 0.5 10*3/uL — ABNORMAL LOW (ref 1.4–6.5)
Neutrophil %: 39.3 %
Platelet: 73 10*3/uL — ABNORMAL LOW (ref 150–440)
RBC: 2.89 10*6/uL — ABNORMAL LOW (ref 3.80–5.20)
RDW: 15.5 % — AB (ref 11.5–14.5)
WBC: 1.2 10*3/uL — AB (ref 3.6–11.0)

## 2013-08-09 LAB — APTT: Activated PTT: 83.9 secs — ABNORMAL HIGH (ref 23.6–35.9)

## 2013-08-13 LAB — CULTURE, BLOOD (SINGLE)

## 2013-08-19 LAB — CBC CANCER CENTER
BASOS ABS: 0 x10 3/mm (ref 0.0–0.1)
Basophil %: 0.9 %
Eosinophil #: 0 x10 3/mm (ref 0.0–0.7)
Eosinophil %: 0.5 %
HCT: 30.7 % — ABNORMAL LOW (ref 35.0–47.0)
HGB: 10.5 g/dL — AB (ref 12.0–16.0)
LYMPHS PCT: 37.4 %
Lymphocyte #: 1.6 x10 3/mm (ref 1.0–3.6)
MCH: 29.2 pg (ref 26.0–34.0)
MCHC: 34.2 g/dL (ref 32.0–36.0)
MCV: 85 fL (ref 80–100)
MONOS PCT: 13.5 %
Monocyte #: 0.6 x10 3/mm (ref 0.2–0.9)
NEUTROS ABS: 2.1 x10 3/mm (ref 1.4–6.5)
Neutrophil %: 47.7 %
PLATELETS: 400 x10 3/mm (ref 150–440)
RBC: 3.6 10*6/uL — ABNORMAL LOW (ref 3.80–5.20)
RDW: 17.1 % — AB (ref 11.5–14.5)
WBC: 4.4 x10 3/mm (ref 3.6–11.0)

## 2013-08-19 LAB — BASIC METABOLIC PANEL
Anion Gap: 8 (ref 7–16)
BUN: 10 mg/dL (ref 7–18)
CALCIUM: 9.3 mg/dL (ref 8.5–10.1)
Chloride: 105 mmol/L (ref 98–107)
Co2: 28 mmol/L (ref 21–32)
Creatinine: 1 mg/dL (ref 0.60–1.30)
EGFR (African American): >60
EGFR (Non-African Amer.): 59 — ABNORMAL LOW
Glucose: 150 mg/dL — ABNORMAL HIGH (ref 65–99)
OSMOLALITY: 283 (ref 275–301)
POTASSIUM: 3.8 mmol/L (ref 3.5–5.1)
SODIUM: 141 mmol/L (ref 136–145)

## 2013-08-20 ENCOUNTER — Ambulatory Visit: Payer: Self-pay | Admitting: Hematology and Oncology

## 2013-08-26 LAB — CBC CANCER CENTER
BASOS ABS: 0 x10 3/mm (ref 0.0–0.1)
BASOS PCT: 0.8 %
Eosinophil #: 0 x10 3/mm (ref 0.0–0.7)
Eosinophil %: 0.2 %
HCT: 29.9 % — ABNORMAL LOW (ref 35.0–47.0)
HGB: 10 g/dL — ABNORMAL LOW (ref 12.0–16.0)
Lymphocyte #: 0.9 x10 3/mm — ABNORMAL LOW (ref 1.0–3.6)
Lymphocyte %: 37.9 %
MCH: 28.9 pg (ref 26.0–34.0)
MCHC: 33.6 g/dL (ref 32.0–36.0)
MCV: 86 fL (ref 80–100)
MONO ABS: 0 x10 3/mm — AB (ref 0.2–0.9)
Monocyte %: 0.9 %
Neutrophil #: 1.4 x10 3/mm (ref 1.4–6.5)
Neutrophil %: 60.2 %
Platelet: 155 x10 3/mm (ref 150–440)
RBC: 3.48 10*6/uL — ABNORMAL LOW (ref 3.80–5.20)
RDW: 16.5 % — AB (ref 11.5–14.5)
WBC: 2.4 x10 3/mm — AB (ref 3.6–11.0)

## 2013-09-02 LAB — CBC CANCER CENTER
BASOS ABS: 0 x10 3/mm (ref 0.0–0.1)
BASOS PCT: 0.3 %
EOS PCT: 0.1 %
Eosinophil #: 0 x10 3/mm (ref 0.0–0.7)
HCT: 26.3 % — AB (ref 35.0–47.0)
HGB: 8.9 g/dL — ABNORMAL LOW (ref 12.0–16.0)
LYMPHS PCT: 49.7 %
Lymphocyte #: 0.6 x10 3/mm — ABNORMAL LOW (ref 1.0–3.6)
MCH: 29 pg (ref 26.0–34.0)
MCHC: 33.7 g/dL (ref 32.0–36.0)
MCV: 86 fL (ref 80–100)
Monocyte #: 0.4 x10 3/mm (ref 0.2–0.9)
Monocyte %: 28.1 %
Neutrophil #: 0.3 x10 3/mm — ABNORMAL LOW (ref 1.4–6.5)
Neutrophil %: 21.8 %
Platelet: 165 x10 3/mm (ref 150–440)
RBC: 3.06 10*6/uL — ABNORMAL LOW (ref 3.80–5.20)
RDW: 16.3 % — AB (ref 11.5–14.5)
WBC: 1.3 x10 3/mm — CL (ref 3.6–11.0)

## 2013-09-06 ENCOUNTER — Encounter: Payer: Self-pay | Admitting: Hematology and Oncology

## 2013-09-09 LAB — CBC CANCER CENTER
Basophil #: 0 x10 3/mm (ref 0.0–0.1)
Basophil %: 0.6 %
EOS ABS: 0 x10 3/mm (ref 0.0–0.7)
Eosinophil %: 0.1 %
HCT: 27 % — ABNORMAL LOW (ref 35.0–47.0)
HGB: 9.1 g/dL — ABNORMAL LOW (ref 12.0–16.0)
LYMPHS ABS: 1.1 x10 3/mm (ref 1.0–3.6)
LYMPHS PCT: 34.8 %
MCH: 29.2 pg (ref 26.0–34.0)
MCHC: 33.9 g/dL (ref 32.0–36.0)
MCV: 86 fL (ref 80–100)
MONO ABS: 0.7 x10 3/mm (ref 0.2–0.9)
Monocyte %: 20.7 %
NEUTROS PCT: 43.8 %
Neutrophil #: 1.4 x10 3/mm (ref 1.4–6.5)
Platelet: 317 x10 3/mm (ref 150–440)
RBC: 3.14 10*6/uL — AB (ref 3.80–5.20)
RDW: 18.3 % — AB (ref 11.5–14.5)
WBC: 3.3 x10 3/mm — ABNORMAL LOW (ref 3.6–11.0)

## 2013-09-09 LAB — BASIC METABOLIC PANEL
Anion Gap: 9 (ref 7–16)
BUN: 6 mg/dL — ABNORMAL LOW (ref 7–18)
Calcium, Total: 8.5 mg/dL (ref 8.5–10.1)
Chloride: 106 mmol/L (ref 98–107)
Co2: 28 mmol/L (ref 21–32)
Creatinine: 0.85 mg/dL (ref 0.60–1.30)
EGFR (African American): >60
EGFR (Non-African Amer.): >60
Glucose: 101 mg/dL — ABNORMAL HIGH (ref 65–99)
OSMOLALITY: 283 (ref 275–301)
POTASSIUM: 3.2 mmol/L — AB (ref 3.5–5.1)
Sodium: 143 mmol/L (ref 136–145)

## 2013-09-16 LAB — CBC CANCER CENTER
BASOS PCT: 0.9 %
Basophil #: 0 x10 3/mm (ref 0.0–0.1)
Eosinophil #: 0 x10 3/mm (ref 0.0–0.7)
Eosinophil %: 0.2 %
HCT: 29.8 % — ABNORMAL LOW (ref 35.0–47.0)
HGB: 10 g/dL — ABNORMAL LOW (ref 12.0–16.0)
Lymphocyte #: 1.2 x10 3/mm (ref 1.0–3.6)
Lymphocyte %: 26.4 %
MCH: 30.2 pg (ref 26.0–34.0)
MCHC: 33.5 g/dL (ref 32.0–36.0)
MCV: 90 fL (ref 80–100)
Monocyte #: 0.4 x10 3/mm (ref 0.2–0.9)
Monocyte %: 9.1 %
Neutrophil #: 3 x10 3/mm (ref 1.4–6.5)
Neutrophil %: 63.4 %
Platelet: 301 x10 3/mm (ref 150–440)
RBC: 3.3 10*6/uL — AB (ref 3.80–5.20)
RDW: 20.1 % — AB (ref 11.5–14.5)
WBC: 4.7 x10 3/mm (ref 3.6–11.0)

## 2013-09-20 ENCOUNTER — Ambulatory Visit: Payer: Self-pay | Admitting: Hematology and Oncology

## 2013-09-23 LAB — COMPREHENSIVE METABOLIC PANEL
ANION GAP: 4 — AB (ref 7–16)
Albumin: 3.3 g/dL — ABNORMAL LOW (ref 3.4–5.0)
Alkaline Phosphatase: 61 U/L
BUN: 9 mg/dL (ref 7–18)
Bilirubin,Total: 0.4 mg/dL (ref 0.2–1.0)
CALCIUM: 9.2 mg/dL (ref 8.5–10.1)
CHLORIDE: 109 mmol/L — AB (ref 98–107)
Co2: 26 mmol/L (ref 21–32)
Creatinine: 0.94 mg/dL (ref 0.60–1.30)
EGFR (Non-African Amer.): >60
GLUCOSE: 144 mg/dL — AB (ref 65–99)
OSMOLALITY: 279 (ref 275–301)
POTASSIUM: 3.5 mmol/L (ref 3.5–5.1)
SGOT(AST): 26 U/L (ref 15–37)
SGPT (ALT): 22 U/L (ref 12–78)
Sodium: 139 mmol/L (ref 136–145)
Total Protein: 7.7 g/dL (ref 6.4–8.2)

## 2013-09-23 LAB — CBC CANCER CENTER
Basophil #: 0 x10 3/mm (ref 0.0–0.1)
Basophil %: 1 %
Eosinophil #: 0.1 x10 3/mm (ref 0.0–0.7)
Eosinophil %: 1.4 %
HCT: 30.5 % — ABNORMAL LOW (ref 35.0–47.0)
HGB: 10.1 g/dL — ABNORMAL LOW (ref 12.0–16.0)
Lymphocyte #: 1 x10 3/mm (ref 1.0–3.6)
Lymphocyte %: 27.4 %
MCH: 30.2 pg (ref 26.0–34.0)
MCHC: 33.2 g/dL (ref 32.0–36.0)
MCV: 91 fL (ref 80–100)
Monocyte #: 0.3 x10 3/mm (ref 0.2–0.9)
Monocyte %: 7.8 %
NEUTROS ABS: 2.4 x10 3/mm (ref 1.4–6.5)
NEUTROS PCT: 62.4 %
Platelet: 241 x10 3/mm (ref 150–440)
RBC: 3.35 10*6/uL — ABNORMAL LOW (ref 3.80–5.20)
RDW: 20.8 % — ABNORMAL HIGH (ref 11.5–14.5)
WBC: 3.8 x10 3/mm (ref 3.6–11.0)

## 2013-09-30 LAB — CBC CANCER CENTER
Basophil #: 0 x10 3/mm (ref 0.0–0.1)
Basophil %: 0.8 %
Eosinophil #: 0 x10 3/mm (ref 0.0–0.7)
Eosinophil %: 0.9 %
HCT: 30 % — ABNORMAL LOW (ref 35.0–47.0)
HGB: 10.1 g/dL — ABNORMAL LOW (ref 12.0–16.0)
Lymphocyte #: 1.2 x10 3/mm (ref 1.0–3.6)
Lymphocyte %: 30.3 %
MCH: 30.7 pg (ref 26.0–34.0)
MCHC: 33.5 g/dL (ref 32.0–36.0)
MCV: 92 fL (ref 80–100)
Monocyte #: 0.4 x10 3/mm (ref 0.2–0.9)
Monocyte %: 8.6 %
Neutrophil #: 2.4 x10 3/mm (ref 1.4–6.5)
Neutrophil %: 59.4 %
Platelet: 279 x10 3/mm (ref 150–440)
RBC: 3.28 10*6/uL — ABNORMAL LOW (ref 3.80–5.20)
RDW: 20.8 % — ABNORMAL HIGH (ref 11.5–14.5)
WBC: 4.1 x10 3/mm (ref 3.6–11.0)

## 2013-10-07 LAB — CBC CANCER CENTER
BASOS ABS: 0 x10 3/mm (ref 0.0–0.1)
Basophil %: 0.7 %
Eosinophil #: 0 x10 3/mm (ref 0.0–0.7)
Eosinophil %: 1.3 %
HCT: 32 % — ABNORMAL LOW (ref 35.0–47.0)
HGB: 10.7 g/dL — AB (ref 12.0–16.0)
Lymphocyte #: 1.2 x10 3/mm (ref 1.0–3.6)
Lymphocyte %: 32.8 %
MCH: 30.9 pg (ref 26.0–34.0)
MCHC: 33.4 g/dL (ref 32.0–36.0)
MCV: 93 fL (ref 80–100)
Monocyte #: 0.4 x10 3/mm (ref 0.2–0.9)
Monocyte %: 11.5 %
Neutrophil #: 1.9 x10 3/mm (ref 1.4–6.5)
Neutrophil %: 53.7 %
PLATELETS: 241 x10 3/mm (ref 150–440)
RBC: 3.45 10*6/uL — ABNORMAL LOW (ref 3.80–5.20)
RDW: 20.4 % — ABNORMAL HIGH (ref 11.5–14.5)
WBC: 3.5 x10 3/mm — ABNORMAL LOW (ref 3.6–11.0)

## 2013-10-07 LAB — COMPREHENSIVE METABOLIC PANEL
ANION GAP: 8 (ref 7–16)
AST: 13 U/L — AB (ref 15–37)
Albumin: 3.4 g/dL (ref 3.4–5.0)
Alkaline Phosphatase: 69 U/L
BILIRUBIN TOTAL: 0.4 mg/dL (ref 0.2–1.0)
BUN: 8 mg/dL (ref 7–18)
CALCIUM: 9.6 mg/dL (ref 8.5–10.1)
Chloride: 106 mmol/L (ref 98–107)
Co2: 28 mmol/L (ref 21–32)
Creatinine: 0.89 mg/dL (ref 0.60–1.30)
EGFR (Non-African Amer.): >60
Glucose: 159 mg/dL — ABNORMAL HIGH (ref 65–99)
Osmolality: 285 (ref 275–301)
Potassium: 3.6 mmol/L (ref 3.5–5.1)
SGPT (ALT): 23 U/L (ref 12–78)
Sodium: 142 mmol/L (ref 136–145)
Total Protein: 7.5 g/dL (ref 6.4–8.2)

## 2013-10-14 LAB — COMPREHENSIVE METABOLIC PANEL
ALBUMIN: 3.4 g/dL (ref 3.4–5.0)
ALT: 23 U/L (ref 12–78)
Alkaline Phosphatase: 65 U/L
Anion Gap: 11 (ref 7–16)
BILIRUBIN TOTAL: 0.5 mg/dL (ref 0.2–1.0)
BUN: 11 mg/dL (ref 7–18)
CHLORIDE: 105 mmol/L (ref 98–107)
Calcium, Total: 9.5 mg/dL (ref 8.5–10.1)
Co2: 26 mmol/L (ref 21–32)
Creatinine: 0.87 mg/dL (ref 0.60–1.30)
EGFR (African American): >60
EGFR (Non-African Amer.): >60
GLUCOSE: 118 mg/dL — AB (ref 65–99)
OSMOLALITY: 284 (ref 275–301)
Potassium: 3.6 mmol/L (ref 3.5–5.1)
SGOT(AST): 15 U/L (ref 15–37)
Sodium: 142 mmol/L (ref 136–145)
Total Protein: 7.7 g/dL (ref 6.4–8.2)

## 2013-10-14 LAB — CBC CANCER CENTER
BASOS ABS: 0 x10 3/mm (ref 0.0–0.1)
BASOS PCT: 0.7 %
Eosinophil #: 0 x10 3/mm (ref 0.0–0.7)
Eosinophil %: 0.9 %
HCT: 32.9 % — ABNORMAL LOW (ref 35.0–47.0)
HGB: 11.1 g/dL — ABNORMAL LOW (ref 12.0–16.0)
Lymphocyte #: 1.2 x10 3/mm (ref 1.0–3.6)
Lymphocyte %: 30 %
MCH: 30.9 pg (ref 26.0–34.0)
MCHC: 33.6 g/dL (ref 32.0–36.0)
MCV: 92 fL (ref 80–100)
Monocyte #: 0.4 x10 3/mm (ref 0.2–0.9)
Monocyte %: 11.1 %
NEUTROS PCT: 57.3 %
Neutrophil #: 2.3 x10 3/mm (ref 1.4–6.5)
Platelet: 238 x10 3/mm (ref 150–440)
RBC: 3.58 10*6/uL — AB (ref 3.80–5.20)
RDW: 19.9 % — ABNORMAL HIGH (ref 11.5–14.5)
WBC: 3.9 x10 3/mm (ref 3.6–11.0)

## 2013-10-20 ENCOUNTER — Ambulatory Visit: Payer: Self-pay | Admitting: Hematology and Oncology

## 2013-10-21 LAB — COMPREHENSIVE METABOLIC PANEL
Albumin: 3.3 g/dL — ABNORMAL LOW (ref 3.4–5.0)
Alkaline Phosphatase: 66 U/L
Anion Gap: 9 (ref 7–16)
BILIRUBIN TOTAL: 0.4 mg/dL (ref 0.2–1.0)
BUN: 11 mg/dL (ref 7–18)
CALCIUM: 9.1 mg/dL (ref 8.5–10.1)
CHLORIDE: 107 mmol/L (ref 98–107)
CREATININE: 0.87 mg/dL (ref 0.60–1.30)
Co2: 26 mmol/L (ref 21–32)
EGFR (African American): >60
GLUCOSE: 117 mg/dL — AB (ref 65–99)
OSMOLALITY: 284 (ref 275–301)
Potassium: 3.6 mmol/L (ref 3.5–5.1)
SGOT(AST): 17 U/L (ref 15–37)
SGPT (ALT): 23 U/L (ref 12–78)
Sodium: 142 mmol/L (ref 136–145)
Total Protein: 7.3 g/dL (ref 6.4–8.2)

## 2013-10-21 LAB — CBC CANCER CENTER
Basophil #: 0 x10 3/mm (ref 0.0–0.1)
Basophil %: 0.9 %
EOS ABS: 0 x10 3/mm (ref 0.0–0.7)
Eosinophil %: 0.7 %
HCT: 31.5 % — AB (ref 35.0–47.0)
HGB: 10.5 g/dL — ABNORMAL LOW (ref 12.0–16.0)
LYMPHS ABS: 1.1 x10 3/mm (ref 1.0–3.6)
LYMPHS PCT: 31.2 %
MCH: 31.1 pg (ref 26.0–34.0)
MCHC: 33.4 g/dL (ref 32.0–36.0)
MCV: 93 fL (ref 80–100)
MONOS PCT: 10.2 %
Monocyte #: 0.4 x10 3/mm (ref 0.2–0.9)
NEUTROS PCT: 57 %
Neutrophil #: 2.1 x10 3/mm (ref 1.4–6.5)
PLATELETS: 221 x10 3/mm (ref 150–440)
RBC: 3.39 10*6/uL — AB (ref 3.80–5.20)
RDW: 19.2 % — ABNORMAL HIGH (ref 11.5–14.5)
WBC: 3.6 x10 3/mm (ref 3.6–11.0)

## 2013-10-28 LAB — CBC CANCER CENTER
Basophil #: 0 x10 3/mm (ref 0.0–0.1)
Basophil %: 0.7 %
Eosinophil #: 0 x10 3/mm (ref 0.0–0.7)
Eosinophil %: 0.7 %
HCT: 32.8 % — ABNORMAL LOW (ref 35.0–47.0)
HGB: 10.8 g/dL — ABNORMAL LOW (ref 12.0–16.0)
LYMPHS PCT: 30.5 %
Lymphocyte #: 0.9 x10 3/mm — ABNORMAL LOW (ref 1.0–3.6)
MCH: 30.9 pg (ref 26.0–34.0)
MCHC: 32.9 g/dL (ref 32.0–36.0)
MCV: 94 fL (ref 80–100)
MONO ABS: 0.3 x10 3/mm (ref 0.2–0.9)
MONOS PCT: 10.1 %
NEUTROS PCT: 58 %
Neutrophil #: 1.8 x10 3/mm (ref 1.4–6.5)
PLATELETS: 221 x10 3/mm (ref 150–440)
RBC: 3.49 10*6/uL — ABNORMAL LOW (ref 3.80–5.20)
RDW: 19.8 % — ABNORMAL HIGH (ref 11.5–14.5)
WBC: 3 x10 3/mm — AB (ref 3.6–11.0)

## 2013-11-04 LAB — CBC CANCER CENTER
BASOS PCT: 0.9 %
Basophil #: 0 x10 3/mm (ref 0.0–0.1)
Eosinophil #: 0 x10 3/mm (ref 0.0–0.7)
Eosinophil %: 0.9 %
HCT: 33 % — AB (ref 35.0–47.0)
HGB: 11.1 g/dL — ABNORMAL LOW (ref 12.0–16.0)
LYMPHS ABS: 0.8 x10 3/mm — AB (ref 1.0–3.6)
Lymphocyte %: 25.8 %
MCH: 31.3 pg (ref 26.0–34.0)
MCHC: 33.6 g/dL (ref 32.0–36.0)
MCV: 93 fL (ref 80–100)
Monocyte #: 0.3 x10 3/mm (ref 0.2–0.9)
Monocyte %: 8.5 %
Neutrophil #: 2 x10 3/mm (ref 1.4–6.5)
Neutrophil %: 63.9 %
Platelet: 215 x10 3/mm (ref 150–440)
RBC: 3.55 10*6/uL — ABNORMAL LOW (ref 3.80–5.20)
RDW: 18.9 % — ABNORMAL HIGH (ref 11.5–14.5)
WBC: 3.2 x10 3/mm — AB (ref 3.6–11.0)

## 2013-11-11 LAB — CBC CANCER CENTER
BASOS PCT: 0.4 %
Basophil #: 0 x10 3/mm (ref 0.0–0.1)
Eosinophil #: 0 x10 3/mm (ref 0.0–0.7)
Eosinophil %: 0.7 %
HCT: 30.9 % — ABNORMAL LOW (ref 35.0–47.0)
HGB: 10.2 g/dL — AB (ref 12.0–16.0)
LYMPHS ABS: 0.9 x10 3/mm — AB (ref 1.0–3.6)
LYMPHS PCT: 26.8 %
MCH: 30.8 pg (ref 26.0–34.0)
MCHC: 32.9 g/dL (ref 32.0–36.0)
MCV: 93 fL (ref 80–100)
MONO ABS: 0.3 x10 3/mm (ref 0.2–0.9)
Monocyte %: 9.4 %
NEUTROS PCT: 62.7 %
Neutrophil #: 2 x10 3/mm (ref 1.4–6.5)
Platelet: 205 x10 3/mm (ref 150–440)
RBC: 3.31 10*6/uL — ABNORMAL LOW (ref 3.80–5.20)
RDW: 19 % — ABNORMAL HIGH (ref 11.5–14.5)
WBC: 3.2 x10 3/mm — ABNORMAL LOW (ref 3.6–11.0)

## 2013-11-18 LAB — BASIC METABOLIC PANEL
Anion Gap: 6 — ABNORMAL LOW (ref 7–16)
BUN: 7 mg/dL (ref 7–18)
CHLORIDE: 109 mmol/L — AB (ref 98–107)
Calcium, Total: 8.8 mg/dL (ref 8.5–10.1)
Co2: 27 mmol/L (ref 21–32)
Creatinine: 0.95 mg/dL (ref 0.60–1.30)
EGFR (African American): >60
EGFR (Non-African Amer.): >60
GLUCOSE: 135 mg/dL — AB (ref 65–99)
Osmolality: 283 (ref 275–301)
POTASSIUM: 3.7 mmol/L (ref 3.5–5.1)
SODIUM: 142 mmol/L (ref 136–145)

## 2013-11-18 LAB — CBC CANCER CENTER
Basophil #: 0 x10 3/mm (ref 0.0–0.1)
Basophil %: 0.6 %
EOS ABS: 0 x10 3/mm (ref 0.0–0.7)
Eosinophil %: 0.6 %
HCT: 31.3 % — ABNORMAL LOW (ref 35.0–47.0)
HGB: 10.3 g/dL — ABNORMAL LOW (ref 12.0–16.0)
LYMPHS ABS: 1 x10 3/mm (ref 1.0–3.6)
Lymphocyte %: 31.4 %
MCH: 31.3 pg (ref 26.0–34.0)
MCHC: 32.8 g/dL (ref 32.0–36.0)
MCV: 95 fL (ref 80–100)
MONOS PCT: 12.3 %
Monocyte #: 0.4 x10 3/mm (ref 0.2–0.9)
NEUTROS PCT: 55.1 %
Neutrophil #: 1.8 x10 3/mm (ref 1.4–6.5)
Platelet: 233 x10 3/mm (ref 150–440)
RBC: 3.28 10*6/uL — ABNORMAL LOW (ref 3.80–5.20)
RDW: 19.2 % — ABNORMAL HIGH (ref 11.5–14.5)
WBC: 3.3 x10 3/mm — ABNORMAL LOW (ref 3.6–11.0)

## 2013-11-20 ENCOUNTER — Ambulatory Visit: Payer: Self-pay | Admitting: Hematology and Oncology

## 2013-11-25 LAB — CBC CANCER CENTER
Basophil #: 0 x10 3/mm (ref 0.0–0.1)
Basophil %: 0.4 %
EOS ABS: 0 x10 3/mm (ref 0.0–0.7)
Eosinophil %: 0.6 %
HCT: 31.8 % — AB (ref 35.0–47.0)
HGB: 10.6 g/dL — AB (ref 12.0–16.0)
Lymphocyte #: 1 x10 3/mm (ref 1.0–3.6)
Lymphocyte %: 29.4 %
MCH: 31.2 pg (ref 26.0–34.0)
MCHC: 33.3 g/dL (ref 32.0–36.0)
MCV: 94 fL (ref 80–100)
Monocyte #: 0.4 x10 3/mm (ref 0.2–0.9)
Monocyte %: 11.3 %
NEUTROS ABS: 1.9 x10 3/mm (ref 1.4–6.5)
Neutrophil %: 58.3 %
Platelet: 217 x10 3/mm (ref 150–440)
RBC: 3.4 10*6/uL — ABNORMAL LOW (ref 3.80–5.20)
RDW: 18.9 % — ABNORMAL HIGH (ref 11.5–14.5)
WBC: 3.3 x10 3/mm — ABNORMAL LOW (ref 3.6–11.0)

## 2013-12-21 ENCOUNTER — Ambulatory Visit: Payer: Self-pay | Admitting: Hematology and Oncology

## 2013-12-30 LAB — COMPREHENSIVE METABOLIC PANEL
ALBUMIN: 3.3 g/dL — AB (ref 3.4–5.0)
Alkaline Phosphatase: 63 U/L
Anion Gap: 8 (ref 7–16)
BUN: 8 mg/dL (ref 7–18)
Bilirubin,Total: 0.6 mg/dL (ref 0.2–1.0)
CALCIUM: 8.9 mg/dL (ref 8.5–10.1)
CHLORIDE: 107 mmol/L (ref 98–107)
CO2: 26 mmol/L (ref 21–32)
Creatinine: 0.98 mg/dL (ref 0.60–1.30)
EGFR (African American): >60
EGFR (Non-African Amer.): >60
Glucose: 108 mg/dL — ABNORMAL HIGH (ref 65–99)
OSMOLALITY: 280 (ref 275–301)
Potassium: 3.4 mmol/L — ABNORMAL LOW (ref 3.5–5.1)
SGOT(AST): 19 U/L (ref 15–37)
SGPT (ALT): 27 U/L
Sodium: 141 mmol/L (ref 136–145)
Total Protein: 7.3 g/dL (ref 6.4–8.2)

## 2013-12-30 LAB — CBC CANCER CENTER
BASOS ABS: 0 x10 3/mm (ref 0.0–0.1)
Basophil %: 0.6 %
Eosinophil #: 0.1 x10 3/mm (ref 0.0–0.7)
Eosinophil %: 1.8 %
HCT: 36.3 % (ref 35.0–47.0)
HGB: 11.8 g/dL — ABNORMAL LOW (ref 12.0–16.0)
LYMPHS PCT: 34.4 %
Lymphocyte #: 1.2 x10 3/mm (ref 1.0–3.6)
MCH: 29.2 pg (ref 26.0–34.0)
MCHC: 32.6 g/dL (ref 32.0–36.0)
MCV: 89 fL (ref 80–100)
MONO ABS: 0.4 x10 3/mm (ref 0.2–0.9)
Monocyte %: 11.7 %
Neutrophil #: 1.8 x10 3/mm (ref 1.4–6.5)
Neutrophil %: 51.5 %
PLATELETS: 180 x10 3/mm (ref 150–440)
RBC: 4.06 10*6/uL (ref 3.80–5.20)
RDW: 17.5 % — AB (ref 11.5–14.5)
WBC: 3.5 x10 3/mm — AB (ref 3.6–11.0)

## 2013-12-31 LAB — CANCER ANTIGEN 27.29: CA 27.29: 3.8 U/mL (ref 0.0–38.6)

## 2014-01-10 LAB — CBC CANCER CENTER
Basophil #: 0 x10 3/mm (ref 0.0–0.1)
Basophil %: 0.7 %
EOS ABS: 0 x10 3/mm (ref 0.0–0.7)
Eosinophil %: 1 %
HCT: 39.2 % (ref 35.0–47.0)
HGB: 12.8 g/dL (ref 12.0–16.0)
LYMPHS PCT: 36.7 %
Lymphocyte #: 1.2 x10 3/mm (ref 1.0–3.6)
MCH: 29.2 pg (ref 26.0–34.0)
MCHC: 32.7 g/dL (ref 32.0–36.0)
MCV: 89 fL (ref 80–100)
MONO ABS: 0.5 x10 3/mm (ref 0.2–0.9)
Monocyte %: 16.1 %
NEUTROS ABS: 1.5 x10 3/mm (ref 1.4–6.5)
NEUTROS PCT: 45.5 %
PLATELETS: 180 x10 3/mm (ref 150–440)
RBC: 4.38 10*6/uL (ref 3.80–5.20)
RDW: 16.9 % — ABNORMAL HIGH (ref 11.5–14.5)
WBC: 3.3 x10 3/mm — AB (ref 3.6–11.0)

## 2014-01-10 LAB — COMPREHENSIVE METABOLIC PANEL
ALBUMIN: 3.6 g/dL (ref 3.4–5.0)
ALK PHOS: 69 U/L
AST: 21 U/L (ref 15–37)
Anion Gap: 5 — ABNORMAL LOW (ref 7–16)
BUN: 11 mg/dL (ref 7–18)
Bilirubin,Total: 0.5 mg/dL (ref 0.2–1.0)
CHLORIDE: 105 mmol/L (ref 98–107)
CO2: 30 mmol/L (ref 21–32)
Calcium, Total: 9.6 mg/dL (ref 8.5–10.1)
Creatinine: 0.94 mg/dL (ref 0.60–1.30)
EGFR (African American): >60
EGFR (Non-African Amer.): >60
Glucose: 137 mg/dL — ABNORMAL HIGH (ref 65–99)
Osmolality: 281 (ref 275–301)
Potassium: 4 mmol/L (ref 3.5–5.1)
SGPT (ALT): 35 U/L
SODIUM: 140 mmol/L (ref 136–145)
TOTAL PROTEIN: 7.9 g/dL (ref 6.4–8.2)

## 2014-01-17 LAB — CBC CANCER CENTER
Basophil #: 0 x10 3/mm (ref 0.0–0.1)
Basophil %: 0.6 %
EOS PCT: 1.2 %
Eosinophil #: 0 x10 3/mm (ref 0.0–0.7)
HCT: 39 % (ref 35.0–47.0)
HGB: 12.7 g/dL (ref 12.0–16.0)
LYMPHS PCT: 34.5 %
Lymphocyte #: 1.1 x10 3/mm (ref 1.0–3.6)
MCH: 28.7 pg (ref 26.0–34.0)
MCHC: 32.7 g/dL (ref 32.0–36.0)
MCV: 88 fL (ref 80–100)
Monocyte #: 0.6 x10 3/mm (ref 0.2–0.9)
Monocyte %: 17.5 %
Neutrophil #: 1.5 x10 3/mm (ref 1.4–6.5)
Neutrophil %: 46.2 %
Platelet: 167 x10 3/mm (ref 150–440)
RBC: 4.44 10*6/uL (ref 3.80–5.20)
RDW: 16.7 % — AB (ref 11.5–14.5)
WBC: 3.3 x10 3/mm — ABNORMAL LOW (ref 3.6–11.0)

## 2014-01-20 ENCOUNTER — Ambulatory Visit: Payer: Self-pay | Admitting: Hematology and Oncology

## 2014-01-24 LAB — CBC CANCER CENTER
Basophil #: 0 x10 3/mm (ref 0.0–0.1)
Basophil %: 0.8 %
Eosinophil #: 0 x10 3/mm (ref 0.0–0.7)
Eosinophil %: 0.9 %
HCT: 39.5 % (ref 35.0–47.0)
HGB: 13.2 g/dL (ref 12.0–16.0)
Lymphocyte #: 1 x10 3/mm (ref 1.0–3.6)
Lymphocyte %: 34 %
MCH: 29.1 pg (ref 26.0–34.0)
MCHC: 33.4 g/dL (ref 32.0–36.0)
MCV: 87 fL (ref 80–100)
Monocyte #: 0.5 x10 3/mm (ref 0.2–0.9)
Monocyte %: 17.6 %
NEUTROS ABS: 1.3 x10 3/mm — AB (ref 1.4–6.5)
NEUTROS PCT: 46.7 %
Platelet: 157 x10 3/mm (ref 150–440)
RBC: 4.52 10*6/uL (ref 3.80–5.20)
RDW: 16.4 % — ABNORMAL HIGH (ref 11.5–14.5)
WBC: 2.9 x10 3/mm — ABNORMAL LOW (ref 3.6–11.0)

## 2014-01-31 LAB — CBC CANCER CENTER
BASOS ABS: 0 x10 3/mm (ref 0.0–0.1)
BASOS PCT: 0.6 %
EOS ABS: 0 x10 3/mm (ref 0.0–0.7)
Eosinophil %: 1.1 %
HCT: 38.6 % (ref 35.0–47.0)
HGB: 12.7 g/dL (ref 12.0–16.0)
Lymphocyte #: 1 x10 3/mm (ref 1.0–3.6)
Lymphocyte %: 38.5 %
MCH: 28.8 pg (ref 26.0–34.0)
MCHC: 32.8 g/dL (ref 32.0–36.0)
MCV: 88 fL (ref 80–100)
MONOS PCT: 13.9 %
Monocyte #: 0.4 x10 3/mm (ref 0.2–0.9)
NEUTROS ABS: 1.2 x10 3/mm — AB (ref 1.4–6.5)
Neutrophil %: 45.9 %
Platelet: 160 x10 3/mm (ref 150–440)
RBC: 4.41 10*6/uL (ref 3.80–5.20)
RDW: 16.2 % — AB (ref 11.5–14.5)
WBC: 2.7 x10 3/mm — ABNORMAL LOW (ref 3.6–11.0)

## 2014-02-10 LAB — CBC CANCER CENTER
BASOS ABS: 0 x10 3/mm (ref 0.0–0.1)
BASOS PCT: 0.3 %
Eosinophil #: 0 x10 3/mm (ref 0.0–0.7)
Eosinophil %: 0.8 %
HCT: 38.3 % (ref 35.0–47.0)
HGB: 12.2 g/dL (ref 12.0–16.0)
Lymphocyte #: 1 x10 3/mm (ref 1.0–3.6)
Lymphocyte %: 32.4 %
MCH: 28 pg (ref 26.0–34.0)
MCHC: 31.9 g/dL — AB (ref 32.0–36.0)
MCV: 88 fL (ref 80–100)
Monocyte #: 0.4 x10 3/mm (ref 0.2–0.9)
Monocyte %: 12.9 %
NEUTROS ABS: 1.7 x10 3/mm (ref 1.4–6.5)
Neutrophil %: 53.6 %
Platelet: 165 x10 3/mm (ref 150–440)
RBC: 4.36 10*6/uL (ref 3.80–5.20)
RDW: 16.7 % — AB (ref 11.5–14.5)
WBC: 3.2 x10 3/mm — ABNORMAL LOW (ref 3.6–11.0)

## 2014-02-14 LAB — CBC CANCER CENTER
BASOS PCT: 0.9 %
Basophil #: 0 x10 3/mm (ref 0.0–0.1)
Eosinophil #: 0 x10 3/mm (ref 0.0–0.7)
Eosinophil %: 0.7 %
HCT: 39.6 % (ref 35.0–47.0)
HGB: 12.9 g/dL (ref 12.0–16.0)
Lymphocyte #: 1 x10 3/mm (ref 1.0–3.6)
Lymphocyte %: 30.1 %
MCH: 28.5 pg (ref 26.0–34.0)
MCHC: 32.5 g/dL (ref 32.0–36.0)
MCV: 88 fL (ref 80–100)
Monocyte #: 0.6 x10 3/mm (ref 0.2–0.9)
Monocyte %: 16.3 %
Neutrophil #: 1.8 x10 3/mm (ref 1.4–6.5)
Neutrophil %: 52 %
PLATELETS: 165 x10 3/mm (ref 150–440)
RBC: 4.52 10*6/uL (ref 3.80–5.20)
RDW: 16.4 % — ABNORMAL HIGH (ref 11.5–14.5)
WBC: 3.4 x10 3/mm — AB (ref 3.6–11.0)

## 2014-02-20 ENCOUNTER — Ambulatory Visit: Payer: Self-pay | Admitting: Hematology and Oncology

## 2014-02-21 ENCOUNTER — Encounter: Payer: Self-pay | Admitting: General Surgery

## 2014-03-10 LAB — CBC CANCER CENTER
BASOS PCT: 0.6 %
Basophil #: 0 x10 3/mm (ref 0.0–0.1)
EOS ABS: 0 x10 3/mm (ref 0.0–0.7)
Eosinophil %: 1.2 %
HCT: 41 % (ref 35.0–47.0)
HGB: 13.3 g/dL (ref 12.0–16.0)
LYMPHS PCT: 36.6 %
Lymphocyte #: 1.2 x10 3/mm (ref 1.0–3.6)
MCH: 27.8 pg (ref 26.0–34.0)
MCHC: 32.3 g/dL (ref 32.0–36.0)
MCV: 86 fL (ref 80–100)
Monocyte #: 0.3 x10 3/mm (ref 0.2–0.9)
Monocyte %: 9.6 %
NEUTROS ABS: 1.7 x10 3/mm (ref 1.4–6.5)
Neutrophil %: 52 %
Platelet: 181 x10 3/mm (ref 150–440)
RBC: 4.77 10*6/uL (ref 3.80–5.20)
RDW: 16.9 % — ABNORMAL HIGH (ref 11.5–14.5)
WBC: 3.2 x10 3/mm — ABNORMAL LOW (ref 3.6–11.0)

## 2014-03-10 LAB — BASIC METABOLIC PANEL
Anion Gap: 8 (ref 7–16)
BUN: 12 mg/dL (ref 7–18)
CHLORIDE: 107 mmol/L (ref 98–107)
CO2: 28 mmol/L (ref 21–32)
CREATININE: 1.07 mg/dL (ref 0.60–1.30)
Calcium, Total: 9.5 mg/dL (ref 8.5–10.1)
EGFR (African American): >60
EGFR (Non-African Amer.): 55 — ABNORMAL LOW
GLUCOSE: 143 mg/dL — AB (ref 65–99)
OSMOLALITY: 287 (ref 275–301)
POTASSIUM: 3.5 mmol/L (ref 3.5–5.1)
Sodium: 143 mmol/L (ref 136–145)

## 2014-03-22 ENCOUNTER — Ambulatory Visit: Payer: Self-pay | Admitting: Hematology and Oncology

## 2014-04-22 ENCOUNTER — Ambulatory Visit: Payer: Self-pay | Admitting: Hematology and Oncology

## 2014-05-09 ENCOUNTER — Other Ambulatory Visit: Payer: Self-pay | Admitting: *Deleted

## 2014-05-23 ENCOUNTER — Ambulatory Visit: Payer: Self-pay | Admitting: Hematology and Oncology

## 2014-06-21 ENCOUNTER — Ambulatory Visit
Admit: 2014-06-21 | Disposition: A | Discharge status: Home / Self Care | Payer: Self-pay | Attending: Hematology and Oncology | Admitting: Hematology and Oncology

## 2014-06-21 ENCOUNTER — Ambulatory Visit (INDEPENDENT_AMBULATORY_CARE_PROVIDER_SITE_OTHER): Payer: 59 | Admitting: General Surgery

## 2014-06-21 ENCOUNTER — Encounter: Payer: Self-pay | Admitting: General Surgery

## 2014-06-21 VITALS — BP 160/78 | HR 90 | Resp 14 | Ht 61.0 in | Wt 170.0 lb

## 2014-06-21 DIAGNOSIS — C50211 Malignant neoplasm of upper-inner quadrant of right female breast: Secondary | ICD-10-CM

## 2014-06-21 DIAGNOSIS — Z853 Personal history of malignant neoplasm of breast: Secondary | ICD-10-CM

## 2014-06-21 NOTE — Progress Notes (Signed)
Patient ID: Abigail Wheeler, female   DOB: 1950/05/14, 64 y.o.   MRN: [id number]  Chief Complaint  Patient presents with  . Procedure    port removal     HPI Abigail Wheeler is a 64 y.o. female. here today for a port removal. She is one year post mastectomy for multifocal invasive lobular carcinoma, T1 N1 ER positive/PR negative HER2 negative. She has completed chemotherapy and radiation. Reportedly had a mammogram in December 2015-Cat 1  HPI  Past Medical History  Diagnosis Date  . Hypertension   . GERD (gastroesophageal reflux disease)   . Cancer 05-20-13    T1 N1 ER positive/PR negative HER2 negative. right breast    Past Surgical History  Procedure Laterality Date  . Cesarean section  1991  . Breast biopsy Right 04-05-13  . Breast surgery Right 05/20/13    mastectomy    Family History  Problem Relation Age of Onset  . Lung cancer Father   . Breast cancer Maternal Grandmother 68    Social History History  Substance Use Topics  . Smoking status: Never Smoker   . Smokeless tobacco: Never Used  . Alcohol Use: No    No Known Allergies  Current Outpatient Prescriptions  Medication Sig Dispense Refill  . acetaminophen (TYLENOL) 500 MG tablet Take 500 mg by mouth every 6 (six) hours as needed.    . calcium carbonate (OS-CAL) 600 MG TABS tablet Take 600 mg by mouth daily.    . Cholecalciferol (VITAMIN D-3) 1000 UNITS CAPS Take 1 capsule by mouth daily.    . fluticasone (VERAMYST) 27.5 MCG/SPRAY nasal spray Place 1 spray into the nose daily.    Marland Kitchen letrozole (FEMARA) 2.5 MG tablet Take 2.5 mg by mouth daily.    Marland Kitchen omeprazole (PRILOSEC) 20 MG capsule Take 20 mg by mouth daily.    . potassium chloride (K-DUR) 10 MEQ tablet Take 20 mEq by mouth 2 (two) times daily.    . rivaroxaban (XARELTO) 20 MG TABS tablet Take 20 mg by mouth daily with supper.     No current facility-administered medications for this visit.    Review of Systems Review of Systems  Constitutional:  Negative.   Respiratory: Negative.   Cardiovascular: Negative.     Blood pressure 160/78, pulse 90, resp. rate 14, height $RemoveBe'5\' 1"'OEafsURHr$  (1.549 m), weight 170 lb (77.111 kg).  Physical Exam Physical Exam  Data Reviewed Notes.  Assessment    Port removed.Stage 2 right breast CA- 38yr out. Pt now on Letrazole.    Plan    Procedure: Removal venous port left upper chest Anesthetic: 1%xylocaine, mixed with0.5%marcaine.28ml Prep: Chloro Prep After prep and drape, the prior incision wasopened. The tethering sutures of the port were removed. Port and catheter removed in full.. Closure done with 3-0 Vicryl in subcutaneous tissue and 4-0 subcuticular stitch for skin. Steri strips, telfa and tegaderm dressing No immediate problems from procedure   Follow up with left screening mammogram in December and office visit.        Abigail Wheeler 06/21/2014, 1:39 PM

## 2014-06-21 NOTE — Patient Instructions (Addendum)
May remove dressing in 3-4 days May shower Ice pack as needed for comfort today\ Follow up with left diagnostic mammogram in December and office visit.

## 2014-08-09 LAB — CREATININE, SERUM: CREATINE, SERUM: 1.02

## 2014-08-13 NOTE — Consult Note (Signed)
Reason for Visit: This 64 year old Female patient presents to the clinic for initial evaluation of  breast cancer .   Referred by Dr. Jamal Collin.  Diagnosis:  Chief Complaint/Diagnosis   64 year old female with stageIIIB (TIV C. N1 (MI) M0) invasive lobular carcinoma of the right breast status post right modified radical mastectomy and sentinel node biopsy and adjuvant chemotherapy with tumor invading skeletal muscle T4 lesion. Status post adjuvant chemotherapy now for chest wall peripheral lymphatic radiation  Pathology Report pathology report reviewed   Imaging Report mammograms ultrasound reviewed   Referral Report clinical notes reviewed   Planned Treatment Regimen adjuvant radiation therapy to right chest wall and peripheral lymphatics   HPI   patient is a 64 year old female who presents with an abnormal mammogram of the upper inner quadrant of her right breast with no palpable abnormality showing a 1 cm spiculated mass overlying the pectoralis muscle on lateral view.mammograms in December of 2014 will not have stereotactic biopsy for ductal carcinoma in situ. MRI scan was performed showing multifocal lesions of the right breast and one on have a right modified radical mastectomy. There were 3 separate foci of invasive lobular carcinoma seen largest being 1.7 cm. Skeletal muscle the chest wall was invaded although 1 reexcision of margin was clear. Tumor is overall grade 1. 2 sentinel lymph nodes were examined one had a 1.7 millimeter focus of metastatic disease no extracapsular extension seen. A formal lymph node dissection was not performed. Tumor was ER positive strongly PR negative HER-2/neu not over expressed.patient has completed Cytoxan Adriamycin weekly Taxol. Her case was presented at our weekly tumor conference and based on skeletal muscle invasion plus one of 2 sentinel nodes positive without full axillary dissection recommendation was made for right chest wall and peripheral lymphatic  radiation. She is seen today for opinion. She is doing well. Has some occasional chest wall pain nothing significant. No lymphedema of right upper extremity. She does have some peripheral neuropathy from her Taxol treatment.  Past Hx:    Pulmonary Embolus:    Arthritis:    Kidney Stones:    GERD - Esophageal Reflux:    HTN:    Breast Cancer:    Right Mastectomy with SN biopsy: Jan 2015   Cesarean Section:    Right breast biopsy:   Past, Family and Social History:  Past Medical History positive   Cardiovascular hypertension   Respiratory pulmonary embolism   Gastrointestinal GERD   Genitourinary kidney stones   Past Surgical History C-section   Past Medical History Comments arthritis   Family History noncontributory   Social History noncontributory   Additional Past Medical and Surgical History accompanied by her husband today   Allergies:   No Known Allergies:   Home Meds:  Home Medications: Medication Instructions Status  Xarelto 20 mg oral tablet 1 tab(s) orally once a day (in the evening) Active  Klor-Con M10 10 mEq oral tablet, extended release 2 tab(s) orally 2 times a day Active  Flonase 50 mcg/inh nasal spray 1 spray(s) nasal once a day Active  omeprazole 20 mg oral delayed release tablet 1 tab(s) orally once a day Active  Tylenol 325 mg oral tablet 2 tab(s) orally every 4 hours, As Needed - for Pain Active   Review of Systems:  General negative   Performance Status (ECOG) 0   Skin negative   Breast see HPI   Ophthalmologic negative   ENMT negative   Respiratory and Thorax negative   Cardiovascular negative  Gastrointestinal negative   Genitourinary negative   Musculoskeletal negative   Neurological negative   Psychiatric negative   Hematology/Lymphatics negative   Endocrine negative   Allergic/Immunologic negative   Review of Systems   denies any weight loss, fatigue, weakness, fever, chills or night sweats. Patient  denies any loss of vision, blurred vision. Patient denies any ringing  of the ears or hearing loss. No irregular heartbeat. Patient denies heart murmur or history of fainting. Patient denies any chest pain or pain radiating to her upper extremities. Patient denies any shortness of breath, difficulty breathing at night, cough or hemoptysis. Patient denies any swelling in the lower legs. Patient denies any nausea vomiting, vomiting of blood, or coffee ground material in the vomitus. Patient denies any stomach pain. Patient states has had normal bowel movements no significant constipation or diarrhea. Patient denies any dysuria, hematuria or significant nocturia. Patient denies any problems walking, swelling in the joints or loss of balance. Patient denies any skin changes, loss of hair or loss of weight. Patient denies any excessive worrying or anxiety or significant depression. Patient denies any problems with insomnia. Patient denies excessive thirst, polyuria, polydipsia. Patient denies any swollen glands, patient denies easy bruising or easy bleeding. Patient denies any recent infections, allergies or URI. Patient "s visual fields have not changed significantly in recent time.   Nursing Notes:  Nursing Vital Signs and Chemo Nursing Nursing Notes: *CC Vital Signs Flowsheet:   24-Aug-15 10:12  Temp Temperature 97.6  Pulse Pulse 117  Respirations Respirations 20  SBP SBP 167  DBP DBP 93  Pain Scale (0-10)  0  Current Weight (kg) (kg) 77.3  Height (cm) centimeters 154.9  BSA (m2) 1.7   Physical Exam:  General/Skin/HEENT:  General normal   Skin normal   Eyes normal   ENMT normal   Head and Neck normal   Additional PE a well-developed slightly obese female in NAD. She status post right modified radical mastectomy. Chest wall is clear. No evidence of mass or nodularity is noted. Left breast is no dominant mass or nodularity in 2 positions examined. No axillary or supraclavicular adenopathy  is appreciated. Lungs are clear to A&P cardiac examination shows regular rate and rhythm. Abdomen is benign.   Breasts/Resp/CV/GI/GU:  Respiratory and Thorax normal   Cardiovascular normal   Gastrointestinal normal   Genitourinary normal   MS/Neuro/Psych/Lymph:  Musculoskeletal normal   Neurological normal   Lymphatics normal   Other Results:  Radiology Results: LabUnknown:    09-Dec-14 15:27, Digital Additional Views Rt Breast (SCR)  PACS Image   [facility]:  Digital Additional Views Rt Breast (SCR)   REASON FOR EXAM:    av rt mass  COMMENTS:       PROCEDURE: MAM - MAM DIG ADDVIEWS RT SCR  - Mar 30 2013  3:27PM     ADDENDUM REPORT: 04/01/2013 14:17    ADDENDUM:  The stereotactic biopsy has been scheduled for 04/08/2013 at 1  o'clock p.m.      Electronically Signed    By: Donavan Burnet M.D.    On: 04/01/2013 14:17  CLINICAL DATA:  Abnormal screening mammogram.    EXAM:  DIGITAL DIAGNOSTIC  RIGHT MAMMOGRAM WITH CAD    ULTRASOUND RIGHT BREAST    COMPARISON:  Multiple priors    ACR Breast Density Category b: There are scattered areas of  fibroglandular density.    FINDINGS:  A 1 cm spiculated mass is seen in the upper inner right breast  overlying the pectoralis muscle on the lateral view. There is  architectural distortion in the area of the mass. No associated  microcalcifications are seen.    Mammographic images were processed with CAD.    On physical exam,no palpable abnormality is identified in the upper  inner right breast.    Targeted ultrasound of the upper inner right breastis negative.     IMPRESSION:  Suspicious right breast mass without sonographic correlate.    RECOMMENDATION:  Right stereotactic biopsy is recommended. Given the far posterior  location of the patient's right breast mass, it was discussed that  this would be technically difficult and possibly unsuccessful. If  adequate tissue sampling cannot be obtained with  stereotactic  guidance, an excisional biopsy will be needed.    I have discussed the findings and recommendations with the patient.  Resultswere also provided in writing at the conclusion of the  visit.    BI-RADS CATEGORY  4: Suspicious abnormality - biopsy should be  considered.    Electronically Signed:  By: Donavan Burnet M.D.  On: 03/31/2013 09:09     Verified By: Maryella Shivers, M.D.,   Relevent Results:   Relevant Scans and Labs ultrasound and mammogram is reviewed   Assessment and Plan: Impression:   stage IIIB by chest wall extension invasive lobular carcinoma right breast status post right modified radical mastectomy and sentinel biopsy with one positive sentinel lymph node status post adjuvant chemotherapy now for chest wall and peripheral lymphatic radiation in 64 year old female Plan:   at this time I have recommended going ahead with chest wall and peripheral lymphatic radiation to 5000 cGy. Would also boost her scar down to the chest wall another 1600 cGy based on probable close margin and skeletal muscle invasion. Risks and benefits of treatment including skin reaction, fatigue, inclusion of some superficial lung, possibility of lymphedema in her right upper extremity, alteration of blood counts were explained in detail to the patient and her husband. Both seem to comprehend my treatment plan well. I have set her up for CT simulation later this week. Patient will be referred back to medical oncology after completion of treatment for probable aromatase inhibitor therapy area and  I would like to take this opportunity for allowing me to participate in the care of your patient..  Fax to Physician:  Physicians To Recieve Fax: Lavera Guise, MD - [phone] Christene Lye - [phone].  Electronic Signatures: Armstead Peaks (MD)  (Signed 24-Aug-15 11:04)  Authored: HPI, Diagnosis, Past Hx, PFSH, Allergies, Home Meds, ROS, Nursing Notes, Physical  Exam, Other Results, Relevent Results, Encounter Assessment and Plan, Fax to Physician   Last Updated: 24-Aug-15 11:04 by Armstead Peaks (MD)

## 2014-08-13 NOTE — Consult Note (Signed)
Reason for Visit: This 64 year old Female patient presents to the clinic for initial evaluation of  breast cancer .   Referred by Dr. Jamal Collin.  Diagnosis:  Chief Complaint/Diagnosis   65 year old female status post right modified radical mastectomy for invasive lobular carcinoma multifocal with micrometastatic sentinel lymph node  Pathology Report pathology report reviewed   Imaging Report mammograms reviewed   Referral Report clinical notes reviewed   Planned Treatment Regimen adjuvant chemotherapy plus whole breast and peripheral fat radiation   HPI   patient is a 64 year old female who presented with an abnormal mammogram of the right breast showing a 1 cm speculated mass in the upper inner quadrant of the right breast overlying the pectoralis muscle. Mammogram was April 01, 2013.she went on to have stereotactic guided biopsy positive for ductal carcinoma in situ. Patient had MRI confirmed multifocal lesions of the right breast went on to have a right modified radical mastectomy and sentinel lymph node biopsies by Dr. Emilee Hero. 2 separate foci of invasive lobular carcinoma was seen. Largest was 1.7 cm. Skeletal muscle of the chest wall was invaded although the deep margin was clear. one of 2 sentinel lymph nodes had a micrometastatic focus. Tumor was strongly ER positive PR negative HER-2/neu not overexpressed. Tumor overall was grade 1. Patient tolerated her surgery well. She scheduled have a port placed next week by Dr. Jamal Collin is also seeing medical oncology today for consideration of systemic chemotherapy. She specifically denies chest wall tenderness cough or bone pain.her case was presented at our multimodality breast cancer conference and recommendations for postop adjuvant chemotherapy and radiation therapy was made.  Past Hx:    Arthritis:    Kidney Stones:    GERD - Esophageal Reflux:    HTN:    Breast Cancer:    Right Mastectomy with SN biopsy: Jan 2015   Cesarean  Section:    Right breast biopsy:   Past, Family and Social History:  Past Medical History positive   Cardiovascular hypertension   Gastrointestinal GERD   Genitourinary kidney stones   Past Surgical History cesarean section   Past Medical History Comments arthritis   Family History positive   Family History Comments grandmother with breast cancer brother with prostate cancer   Social History noncontributory   Additional Past Medical and Surgical History seen compared by her husband today and nurse navigator   Allergies:   No Known Allergies:   Other Results:  Radiology Results: LabUnknown:    09-Dec-14 15:27, Digital Additional Views Rt Breast (SCR)  PACS [place]:  Digital Additional Views Rt Breast (SCR)   REASON FOR EXAM:    av rt mass  COMMENTS:       PROCEDURE: MAM - MAM DIG ADDVIEWS RT SCR  - Mar 30 2013  3:27PM     ADDENDUM REPORT: 04/01/2013 14:17    ADDENDUM:  The stereotactic biopsy has been scheduled for 04/08/2013 at 1  o'clock p.m.      Electronically Signed    By: Donavan Burnet M.D.    On: 04/01/2013 14:17  CLINICAL DATA:  Abnormal screening mammogram.    EXAM:  DIGITAL DIAGNOSTIC  RIGHT MAMMOGRAM WITH CAD    ULTRASOUND RIGHT BREAST    COMPARISON:  Multiple priors    ACR Breast Density Category b: There are scattered areas of  fibroglandular density.    FINDINGS:  A 1 cm spiculated mass is seen in the upper inner right breast  overlying the pectoralis  muscle on the lateral view. There is  architectural distortion in the area of the mass. No associated  microcalcifications are seen.    Mammographic images were processed with CAD.    On physical exam,no palpable abnormality is identified in the upper  inner right breast.    Targeted ultrasound of the upper inner right breastis negative.     IMPRESSION:  Suspicious right breast mass without sonographic correlate.    RECOMMENDATION:  Right stereotactic biopsy  is recommended. Given the far posterior  location of the patient's right breast mass, it was discussed that  this would be technically difficult and possibly unsuccessful. If  adequate tissue sampling cannot be obtained with stereotactic  guidance, an excisional biopsy will be needed.    I have discussed the findings and recommendations with the patient.  Resultswere also provided in writing at the conclusion of the  visit.    BI-RADS CATEGORY  4: Suspicious abnormality - biopsy should be  considered.    Electronically Signed:  By: Donavan Burnet M.D.  On: 03/31/2013 09:09     Verified By: Maryella Shivers, M.D.,   Relevent Results:   Relevant Scans and Labs mammograms reviewed   Assessment and Plan: Impression:   stage IB (T1 C. N1 (MIC) M0) invasive lobular carcinoma in 64 year old female status post right modified radical mastectomy and sentinel lymph node biopsy. Tumor is ER positive PR negative HER-2/neu not overexpressed to receive adjuvant chemotherapy plus chest wall and peripheral lymphatic radiation Plan:   at this time I have recommended chest wall and peripheral lymphatic radiation based on poor prognostic features including skeletal muscle invasion and one of 2 sentinel lymph nodes showing micrometastatic disease. I have discussed the case personally with medical oncology who will be recommending adjuvant chemotherapy. She's or rescheduled for port placement. Risks and benefits of treatment including chest wall irritation, fatigue, inclusive some superficial lung possibly, and very slight chance of lymphedema in her right upper extremity were all explained in detail to the patient. She seems to comprehend my treatment plan well. We'll reevaluate the patient after completion of her chemotherapy.  I would like to take this opportunity to thank you for allowing me to continue to participate in this patient's care.  CC Referral:  cc: Dr. Jamal Collin, Dr. Loyal Buba    Electronic Signatures: Baruch Gouty, Roda Shutters (MD)  (Signed 13-Feb-15 11:05)  Authored: HPI, Diagnosis, Past Hx, PFSH, Allergies, Other Results, Relevent Results, Encounter Assessment and Plan, [place] Referring Physician   Last Updated: 13-Feb-15 11:05 by Armstead Peaks (MD)

## 2014-08-13 NOTE — Discharge Summary (Signed)
PATIENT NAME:  Abigail Wheeler, Abigail Wheeler MR#:  [id number] DATE OF BIRTH:  [birth year]  DATE OF ADMISSION:  08/08/2013 DATE OF DISCHARGE:  08/10/2013  PRESENTING COMPLAINT: Left-sided chest pain.   DISCHARGE DIAGNOSES:  1. Acute left-sided pulmonary embolism.  2. Left lower lobe pneumonia/consolidation.   3. Ongoing chemotherapy for breast cancer.  4. Neutropenia.  5. Hypertension.  6. Tachycardia.   CODE STATUS: Full code.   OXYGEN SATURATION: 93% to 95% on room air.   MEDICATIONS:  1. Prochlorperazine 10 mg 1 tablet 3 times a day as needed.  2. Klor-Con 10 mEq p.o. b.i.d.  3. Xarelto 15 mg b.i.d. for 21 days and then 20 mg p.o. daily.  4. Levaquin 500 mg p.o. daily.  5. Metoprolol 25 mg b.i.d.   FOLLOWUP:  1. Follow up with Dr. Kallie Edward at your next scheduled [place] appointment.  2. Follow up with Dr. Clayborn Bigness.   CONSULTATION: Oncology consultation with Dr. Kallie Edward.   DIAGNOSTIC STUDIES:  Echo Doppler showed EF of 60% to 65%, normal left ventricular systolic function. Mild mitral valve regurgitation. Mild tricuspid regurgitation.  Hemoglobin is 8.4, hematocrit is 24.8, white count is 1.2, platelet count is 73.  Glucose is 113, BUN is 8, creatinine is 0.6, sodium is 138.  Blood cultures negative in 48 hours.  CT angiography of the chest showed multiple left lower lobe pulmonary artery emboli. No central pulmonary embolus. Left lower lobe consolidation. No appreciable thoracic adenopathy.   BRIEF SUMMARY OF HOSPITAL COURSE: Abigail Wheeler is a very pleasant 64 year old African-American female who has history of breast cancer, undergoing chemotherapy. She comes to the Emergency Room with:   1. Acute left-sided pleuritic chest pain, which is suspected due to acute left lower lobe pulmonary emboli. Her risk factor is breast cancer. She was started on Lovenox and then changed to p.o. Xarelto after discussing with Dr. Kallie Edward. Echo did not show any evidence of right heart strain.  2.  Systemic inflammatory response syndrome due to left lower lobe pneumonia/consolidation/secondary to possible infarct from pulmonary emboli. She was empirically covered with antibiotics, Zosyn and Levaquin, changed to p.o. Levaquin on discharge in the setting of neutropenia with history of breast cancer. Saturations remained more than 98% on room air. Incentive spirometer was given.  3. Pancytopenia. Does not have any bleeding. No indications for packed cell platelet transfusion. She did receive Neulasta on April 9th. Dr. Kallie Edward to see in followup as outpatient.  4. Hypertension with tachycardia. The patient's lisinopril was discontinued. She was started instead on low dose of metoprolol.  5. Hospital stay otherwise remained stable.   CODE STATUS: The patient remained a full code.   TIME SPENT: 40 minutes.     ____________________________ Hart Rochester Posey Pronto, MD sap:lb D: 08/11/2013 07:10:07 ET T: 08/11/2013 08:27:42 ET JOB#: [id number]  cc: Staci Dack A. Posey Pronto, MD, <Dictator> Rae Halsted. Kallie Edward, MD Lavera Guise, MD Ilda Basset MD ELECTRONICALLY SIGNED 08/16/2013 9:13

## 2014-08-13 NOTE — Op Note (Signed)
PATIENT NAME:  Abigail Wheeler, Abigail Wheeler MR#:  [id number] DATE OF BIRTH:  May 26, 1950  DATE OF PROCEDURE:  06/10/2013  PREOPERATIVE DIAGNOSIS:  Right breast carcinoma, status post mastectomy.   POSTOPERATIVE DIAGNOSIS: Right breast carcinoma, status post mastectomy.   PROCEDURE:  Insertion of venous access port in the left subclavian vein.   SURGEON: Mckinley Jewel, M.D.   ANESTHESIA: Monitored care with the use of local anesthetic containing 0.5% Marcaine and 1% Xylocaine and 20 mL for use.   COMPLICATIONS: None.   ADDITIONAL PROCEDURES:  Ultrasound under fluoroscopic guidance.    DESCRIPTION OF PROCEDURE: The patient was placed in the supine position on the operating table and adequately sedated and monitored. A time out was performed. The left upper chest and neck area were prepped and draped out as a sterile field. Ultrasound probe was brought up to the field and the subclavian vein was identified beneath the lateral end of the clavicle. After instillation of local anesthetic, a small stab incision was made through which a needle was then positioned going into the subclavian vein using the ultrasound. Pre-withdrawal of blood was noted. A guidewire was positioned, followed by the introducer dilator and subsequent placement of the catheter with fluoroscopic guidance with the catheter position going into the distal superior vena cava. Skin margin was between 20 to 25 cm. Subcutaneous pocket was created over the second costal cartilage after instillation of local anesthetics. A transverse incision was made and the skin and subcutaneous tissue was elevated to allow for the placement of the port. The catheter was then tunneled through to the port site, cut to approximate length and affixed to the prefilled port. The port was then placed in the pocket and anchored to the underlying fascia with three 2-0 Prolene stitches, and then flushed through with heparinized saline. Fluoroscopy was repeated to ensure that the  catheter was still in proper position. After the position was verified, the wound was closed. Subcutaneous tissue was closed with 3-0 Vicryl and the skin with subcuticular 4-0 Vicryl, covered with Dermabond. The procedure was well tolerated and she was subsequently returned to the recovery room in stable condition    ____________________________ S.Robinette Haines, MD sgs:NTS D: 06/10/2013 19:41:37 ET T: 06/11/2013 01:29:20 ET JOB#: [id number]  cc: S.G. Jamal Collin, MD, <Dictator> [facility] Robinette Haines MD ELECTRONICALLY SIGNED 06/11/2013 11:05

## 2014-08-13 NOTE — H&P (Signed)
PATIENT NAME:  Abigail Wheeler, Abigail Wheeler MR#:  [id number] DATE OF BIRTH:  [birth year]  DATE OF ADMISSION:  08/08/2013  PRIMARY CARE PHYSICIAN: Dr. Clayborn Bigness.   REFERRING PHYSICIAN: Dr. Carrie Mew.  CHIEF COMPLAINT: Left-sided chest pain, shoulder pain.   HISTORY OF PRESENT ILLNESS: Abigail Wheeler is a 64 year old pleasant African American female who was recently diagnosed with breast cancer, underwent a mastectomy, and currently undergoing chemotherapy presented to the Emergency Department with the complaints of left shoulder pain and left-sided chest pain on the lateral aspect of the chest for about a week. The pain was on and off. However, since last night the pain has been constant, sharp in nature, worse taking a deep breath. Concerning this, came to the Emergency Department. Workup in the Emergency Department with a CT of the chest showed multiple pulmonary emboli on the left lower lobe and is also found to have left lower lobe consolidation. The patient denies having any shortness of breath, cough, or fever. The patient received heparin drip in the Emergency Department with a bolus. The patient states has a somewhat decreased appetite.   PAST MEDICAL HISTORY:  1. Osteoarthritis.  2. Kidney stones.  3. Gastroesophageal reflux disease.   4. Hypertension. 5. Breast cancer.   PAST SURGICAL HISTORY:  1. Right mastectomy.  2. Cesarean section.  3. Right breast biopsy.   ALLERGIES: No known drug allergies.   HOME MEDICATIONS:  1. Compazine 10 mg 3 times a day.  2. Omeprazole 20 mg a day.  3. Lisinopril 10 mg once a day.  4. Klor-Con 10 mEq 2 times a day.   SOCIAL HISTORY: Denies smoking, drinking alcohol, or using illicit drugs. Works as a Quarry manager. Married, lives with her husband.   FAMILY HISTORY: Father had lung cancer. Mother had heart attack.   REVIEW OF SYSTEMS:  CONSTITUTIONAL: Generalized weakness.  EYES: No change in vision.  ENT: No change in hearing.  RESPIRATORY: Has no chest  pain. No shortness of breath.  CARDIOVASCULAR: Has chest pain on the left side of the chest.  GASTROINTESTINAL: Has decreased appetite.  GENITOURINARY: No dysuria or hematuria.  HEMATOLOGIC: No easy bruising or bleeding.  SKIN: No rash or lesions.  MUSCULOSKELETAL: No joint pains and aches. Has history of osteoarthritis.  NEUROLOGIC: The patient has weakness or numbness in any part of the body.   PHYSICAL EXAMINATION:  GENERAL: This is a well-built, well-nourished, age-appropriate female, lying down in the bed, not in distress.  VITAL SIGNS: Temperature 98.2, pulse 131, blood pressure 143/71, respiratory rate of 16, oxygen saturation is 96% on room air.  HEENT: Head normocephalic, atraumatic. There is no scleral icterus. Conjunctivae normal. Pupils equal and react to light.  EYES: No change in vision. Pupils equal and react to light. Extraocular movements are intact. Mucous membranes moist. No pharyngeal erythema.  NECK: Supple. No lymphadenopathy. No JVD. No carotid bruit.  CHEST: Has no focal tenderness. LUNGS: Bilateral clear to auscultation. HEART: S1, S2 regular. No murmurs are heard. Tachycardia. No pedal edema. Pulses 2+.  ABDOMEN: Bowel sounds present. Soft. Mild tenderness in the epigastric area. No rebound or guarding. Could not appreciate any hepatosplenomegaly.  SKIN: No rash or lesion.  MUSCULOSKELETAL: Good range of motion in all the extremities.  NEUROLOGIC: The patient is alert, oriented to place, person, and time. Cranial nerves II-XII intact. Motor 5/5 in upper and lower extremities.   LABORATORY DATA: CBC: WBC of 1.4, hemoglobin 9.4, platelet count of 83. Troponin less than 0.02. CMP is completely within normal  limits. Absolute neutrophil count of 800.   ASSESSMENT AND PLAN: Ms. Aguado is a 64 year old female who comes to the Emergency Department with left-sided chest pain and is found to have pulmonary emboli the left-sided pneumonia.  1. Pulmonary emboli. Risk factors  are breast cancer. We will obtain venous Dopplers of the lower extremities. Also obtain echocardiogram if there is and right heart strain. The patient is tachycardic. Oxygen saturations are well maintained. Continue with IV fluids. Start the patient on Lovenox and the patient would benefit being on Lovenox. Considering the patient's thrombocytopenia would not consider starting on Xarelto as we cannot reverse the effects of this Xarelto.  2. Leukopenia. Absolute neutrophil count of 800. We will keep the patient on neutropenic precautions.  3. Pancytopenia. The patient does not have any bleeding. No indication for packed RBC or platelet transfusions.  4. Hypertension: Hold hydrochlorothiazide.  5. The patient is on Lovenox which should cover for the deep vein thrombosis and pulmonary embolus.    TIME SPENT: 50 minutes.   ____________________________ Abigail Becton, MD pv:lt D: 08/08/2013 22:00:52 ET T: 08/08/2013 22:55:38 ET JOB#: [id number]  cc: Abigail Becton, MD, <Dictator> Lavera Guise, MD Grier Mitts Ceaira Ernster MD ELECTRONICALLY SIGNED 08/19/2013 20:54

## 2014-08-13 NOTE — Consult Note (Signed)
situation was discussed with Dr. Kallie Edward and according to Dr. Vernie Shanks  patient will be seen by her tomorrow.may be of any   acure problem   till then   Electronic Signatures: Adaleena Mooers, Martie Lee (MD)  (Signed on 20-Apr-15 14:21)  Authored  Last Updated: 20-Apr-15 14:21 by Jobe Gibbon (MD)

## 2014-08-13 NOTE — Op Note (Signed)
PATIENT NAME:  Abigail Wheeler, Abigail Wheeler MR#:  [id number] DATE OF BIRTH:  1950/10/22  DATE OF PROCEDURE:  05/19/2013  PREOPERATIVE DIAGNOSIS: Multifocal carcinoma, right breast.   POSTOPERATIVE DIAGNOSIS: Multifocal carcinoma, right breast.   OPERATION: Right total mastectomy with sentinel node biopsy.   SURGEON: Mckinley Jewel, M.D.   ANESTHESIA: General.   COMPLICATIONS: None.   ESTIMATED BLOOD LOSS: 50 mL.   DRAINS: Blake drain.   DESCRIPTION OF PROCEDURE: The patient underwent nuclear contrast injection preoperatively and she was brought to the operating room and placed in the supine position on the operating table and put to sleep with a LMA. The right breast and axilla were then prepped and draped out as a sterile field. A timeout procedure was performed. The gamma finder showed only small focal activity in the medial portion of the axilla adjacent to the axillary tail of the breast. An elliptical skin incision was mapped out and the skin incision was made. The patient also received 5 mL of methylene blue diluted to 50% injected in the subareolar space The superior flap was elevated along the axillary portion of the incision and carefully the pectoralis major muscle was identified and the axillary fat pad underneath that exposed. With the use of a gamma finder, 2 adjacent nodes were identified carrying signal activity and also 1 of them appearing blue. These 2 nodes were sent off as sentinel nodes 1 and 2.  The patient also was noted to have a node identified in the interpectoral groove that was about a centimeter and this was removed and sent off as a nonsentinel node for permanent section. The initial assay of the sentinel nodes 1 and 2 showed no evidence of gross metastatic disease. The superior flap was then created all the way to the level just below the clavicle with bleeding controlled with cautery and ligatures of 3-0 Vicryl. The inferior flap was then elevated all the way down to the upper  rectus fascia well past the end of the breast tissue. The breast along the pectoral fascia was then dissected off from the underlying muscle, from the medial to the lateral aspect, and the lateral attachments were freed and the breast at the lateral end was tagged with a stitch. On inspection of the posterior aspect of the breast, in the upper inner quadrant region, a small firm area was palpated very close to the fascia and at this corresponding point on the muscle additional tissue was removed and a new deep margin was tagged for pathology. The patient was noted to have at least 2 foci, maybe 3, of invasive cancer in the upper outer quadrant, 1 located more anteriorly and 2 in the more posterior location. Hemostasis was obtained with the use of cautery and some suture ligatures of 3-0 Vicryl on the chest wall. The area was irrigated with some saline. A Blake drain was positioned and brought out through a stab incision inferiorly and fastened to the skin with a nylon stitch. Subcuticular and subcutaneous tissue was then approximated with multiple interrupted stitches of 2-0 Vicryl, and the skin was then closed with 2 running sutures of 3-0 Monocryl. The incision was covered with Dermabond. A fluffy dressing with ABDs and a surgical bra was then placed. The patient subsequently was extubated and returned to the recovery room in stable condition.   ____________________________ S.Robinette Haines, MD sgs:sb D: 05/20/2013 08:36:32 ET T: 05/20/2013 08:52:14 ET JOB#: [id number]  cc: Synthia Innocent. Jamal Collin, MD, <Dictator> [facility] Robinette Haines MD ELECTRONICALLY SIGNED 05/24/2013 13:06

## 2014-08-13 NOTE — Consult Note (Signed)
Brief Consult Note: Diagnosis: Breast cancer/Pulm embolus.   Discussed with Attending MD.   Comments: Patient much improved today.Patient will be discharged on Xarelto . RTC 1 week for next chemotherapy Appreciate medicine input and care.  Electronic Signatures: Georges Mouse (MD)  (Signed 21-Apr-15 16:14)  Authored: Brief Consult Note   Last Updated: 21-Apr-15 16:14 by Georges Mouse (MD)

## 2014-08-25 ENCOUNTER — Other Ambulatory Visit: Payer: Self-pay | Admitting: *Deleted

## 2014-08-25 MED ORDER — OMEPRAZOLE 20 MG PO CPDR: 20.0000 mg | DELAYED_RELEASE_CAPSULE | Freq: Every day | ORAL | Status: DC

## 2014-08-25 NOTE — Telephone Encounter (Signed)
Needs refill on Omeprazole

## 2014-09-01 ENCOUNTER — Ambulatory Visit
Admission: RE | Admit: 2014-09-01 | Discharge: 2014-09-01 | Disposition: A | Discharge status: Home / Self Care | Payer: 59 | Source: Ambulatory Visit | Attending: Radiation Oncology | Admitting: Radiation Oncology

## 2014-09-01 ENCOUNTER — Encounter: Payer: Self-pay | Admitting: Radiation Oncology

## 2014-09-01 ENCOUNTER — Encounter (INDEPENDENT_AMBULATORY_CARE_PROVIDER_SITE_OTHER): Payer: Self-pay

## 2014-09-01 ENCOUNTER — Ambulatory Visit: Payer: 59 | Admitting: Radiation Oncology

## 2014-09-01 VITALS — BP 149/89 | HR 79 | Resp 18 | Wt 167.5 lb

## 2014-09-01 DIAGNOSIS — C50211 Malignant neoplasm of upper-inner quadrant of right female breast: Secondary | ICD-10-CM

## 2014-09-01 NOTE — Progress Notes (Signed)
Radiation Oncology Follow up Note  Name: Abigail Wheeler   Date:   09/01/2014 MRN:  [id number] DOB: 1950-08-09    This 64 y.o. female presents to the clinic today for follow-up of breast cancer.  REFERRING PROVIDER: Lavera Guise, MD  HPI: Patient is a pleasant 63 year old female with stage IIIB multifocal right breast cancer status post right modified radical mastectomy January 2015. She also had 4 cycles of adjuvant Adriamycin Cytoxan followed by 12 weeks of Taxol. She received right chest wall peripheral lymphatic radiation therapy now out 6 months. Tumor was stage IIIB based on invasion of pectoralis muscle. She is seen today in routine follow-up and is doing well. Currently is on aromatase inhibitor therapy without side effect except for hot flashes. She specifically denies any right chest wall masses or nodularity. She specifically denies any swelling of her right upper extremity. Left mammogram December 2015 was fine. She is currently on Femara. Patient is also had her Port-A-Cath removed..  COMPLICATIONS OF TREATMENT: none  FOLLOW UP COMPLIANCE: keeps appointments   PHYSICAL EXAM:  BP 149/89 mmHg  Pulse 79  Resp 18  Wt 167 lb 8.8 oz (76 kg) I well-developed slightly obese female in NAD. She status post right modified radical mastectomy. Chest walls clear without evidence of nodularity or mass. Left breast is free of dominant mass or nodularity in 2 positions examined. No axillary or supraclavicular adenopathy is identified. No lymphedema in her right upper extremity is noted. Well-developed well-nourished patient in NAD. HEENT reveals PERLA, EOMI, discs not visualized.  Oral cavity is clear. No oral mucosal lesions are identified. Neck is clear without evidence of cervical or supraclavicular adenopathy. Lungs are clear to A&P. Cardiac examination is essentially unremarkable with regular rate and rhythm without murmur rub or thrill. Abdomen is benign with no organomegaly or masses  noted. Motor sensory and DTR levels are equal and symmetric in the upper and lower extremities. Cranial nerves II through XII are grossly intact. Proprioception is intact. No peripheral adenopathy or edema is identified. No motor or sensory levels are noted. Crude visual fields are within normal range.   RADIOLOGY RESULTS: Prior mammogram of left breast was reviewed  PLAN: At the present time she continues to do well with no evidence of disease. I am please were overall progress. I have asked to see her back in 1 year for follow-up. She continues close follow-up care with medical oncology. Patient knows to call with any concerns.  I would like to take this opportunity for allowing me to participate in the care of your patient.Armstead Peaks., MD

## 2014-09-02 ENCOUNTER — Other Ambulatory Visit: Payer: Self-pay

## 2014-09-02 DIAGNOSIS — C50911 Malignant neoplasm of unspecified site of right female breast: Secondary | ICD-10-CM

## 2014-09-09 ENCOUNTER — Telehealth: Payer: Self-pay | Admitting: *Deleted

## 2014-09-09 ENCOUNTER — Inpatient Hospital Stay: Payer: 59

## 2014-09-09 ENCOUNTER — Inpatient Hospital Stay: Payer: 59 | Attending: Hematology and Oncology | Admitting: Hematology and Oncology

## 2014-09-09 VITALS — BP 146/84 | HR 99 | Temp 98.3°F | Wt 167.8 lb

## 2014-09-09 DIAGNOSIS — I10 Essential (primary) hypertension: Secondary | ICD-10-CM | POA: Insufficient documentation

## 2014-09-09 DIAGNOSIS — C50911 Malignant neoplasm of unspecified site of right female breast: Secondary | ICD-10-CM

## 2014-09-09 DIAGNOSIS — Z923 Personal history of irradiation: Secondary | ICD-10-CM | POA: Diagnosis not present

## 2014-09-09 DIAGNOSIS — R202 Paresthesia of skin: Secondary | ICD-10-CM | POA: Diagnosis not present

## 2014-09-09 DIAGNOSIS — K219 Gastro-esophageal reflux disease without esophagitis: Secondary | ICD-10-CM | POA: Insufficient documentation

## 2014-09-09 DIAGNOSIS — M129 Arthropathy, unspecified: Secondary | ICD-10-CM | POA: Diagnosis not present

## 2014-09-09 DIAGNOSIS — Z79899 Other long term (current) drug therapy: Secondary | ICD-10-CM | POA: Diagnosis not present

## 2014-09-09 DIAGNOSIS — Z87442 Personal history of urinary calculi: Secondary | ICD-10-CM | POA: Insufficient documentation

## 2014-09-09 DIAGNOSIS — R232 Flushing: Secondary | ICD-10-CM | POA: Diagnosis not present

## 2014-09-09 DIAGNOSIS — Z79811 Long term (current) use of aromatase inhibitors: Secondary | ICD-10-CM | POA: Insufficient documentation

## 2014-09-09 DIAGNOSIS — Z170 Estrogen receptor positive status [ER+]: Secondary | ICD-10-CM | POA: Insufficient documentation

## 2014-09-09 DIAGNOSIS — I2699 Other pulmonary embolism without acute cor pulmonale: Secondary | ICD-10-CM

## 2014-09-09 DIAGNOSIS — Z7901 Long term (current) use of anticoagulants: Secondary | ICD-10-CM | POA: Diagnosis not present

## 2014-09-09 DIAGNOSIS — Z86711 Personal history of pulmonary embolism: Secondary | ICD-10-CM | POA: Insufficient documentation

## 2014-09-09 DIAGNOSIS — Z9221 Personal history of antineoplastic chemotherapy: Secondary | ICD-10-CM | POA: Diagnosis not present

## 2014-09-09 LAB — CBC WITH DIFFERENTIAL/PLATELET
Basophils Absolute: 0 10*3/uL (ref 0–0.1)
Basophils Relative: 1 %
Eosinophils Absolute: 0.1 10*3/uL (ref 0–0.7)
Eosinophils Relative: 1 %
HCT: 42.5 % (ref 35.0–47.0)
Hemoglobin: 14.2 g/dL (ref 12.0–16.0)
Lymphocytes Relative: 41 %
Lymphs Abs: 1.5 10*3/uL (ref 1.0–3.6)
MCH: 29 pg (ref 26.0–34.0)
MCHC: 33.4 g/dL (ref 32.0–36.0)
MCV: 87 fL (ref 80.0–100.0)
Monocytes Absolute: 0.4 10*3/uL (ref 0.2–0.9)
Monocytes Relative: 11 %
Neutro Abs: 1.7 10*3/uL (ref 1.4–6.5)
Neutrophils Relative %: 46 %
Platelets: 186 10*3/uL (ref 150–440)
RBC: 4.89 MIL/uL (ref 3.80–5.20)
RDW: 15.1 % — ABNORMAL HIGH (ref 11.5–14.5)
WBC: 3.7 10*3/uL (ref 3.6–11.0)

## 2014-09-09 LAB — COMPREHENSIVE METABOLIC PANEL
ALT: 50 U/L (ref 14–54)
AST: 37 U/L (ref 15–41)
Albumin: 4.3 g/dL (ref 3.5–5.0)
Alkaline Phosphatase: 71 U/L (ref 38–126)
Anion gap: 6 (ref 5–15)
BUN: 18 mg/dL (ref 6–20)
CO2: 26 mmol/L (ref 22–32)
Calcium: 9.8 mg/dL (ref 8.9–10.3)
Chloride: 104 mmol/L (ref 101–111)
Creatinine, Ser: 1.09 mg/dL — ABNORMAL HIGH (ref 0.44–1.00)
GFR calc Af Amer: >60 mL/min (ref >60)
GFR calc non Af Amer: 52 mL/min — ABNORMAL LOW (ref >60)
Glucose, Bld: 160 mg/dL — ABNORMAL HIGH (ref 65–99)
Potassium: 3.9 mmol/L (ref 3.5–5.1)
Sodium: 136 mmol/L (ref 135–145)
Total Bilirubin: 0.6 mg/dL (ref 0.3–1.2)
Total Protein: 8.2 g/dL — ABNORMAL HIGH (ref 6.5–8.1)

## 2014-09-09 NOTE — Telephone Encounter (Signed)
Patient has not seen Dr. Humphrey Rolls since December 2014.  Dr. Mike Gip made aware.

## 2014-09-10 ENCOUNTER — Encounter: Payer: Self-pay | Admitting: Hematology and Oncology

## 2014-09-10 LAB — CANCER ANTIGEN 27.29: CA 27.29: 23.6 U/mL (ref 0.0–38.6)

## 2014-09-10 NOTE — Progress Notes (Signed)
[facility] day:  09/09/2014  Chief Complaint: Abigail Wheeler is an 64 y.o. female history of stage IIIB right breast cancer who is seen for 3 month assessment on Femara.  HPI: The patient was last seen in the medical oncology clinic on 06/10/2014. At that time she was doing well. She noted some hot flashes. She notes some ahiness in her shoulders and rare tingling in her fingers (residual neuropathy post Taxol). She was performing a breast exam monthly. Exam was unremarkable. Labs included a normal CBC, CMP, and CA 27.29 (12.4).  Bone density study on 03/22/2014 noted a T score of -1.6 in the right femoral neck. I discussed both calcium and vitamin D supplimentation. I discussed Port-A-Cath removal. She notes that her port was removed in 07/2014 by Dr. Jamal Collin.  Symptomatically, she feels pretty good. She states that she has had some cramps which may be related to her potassium.She was taking 20 mEq of potassium twice daily for a long time.  She believes this was started by oncology.  She inquires about her need to continue Xarelto. She had a pulmonary embolism in the past. He is unaware if a hypercoagulable workup was sent or how significant was her pulmonary embolism. She has continued taking Xarelto.  She denies any breast concerns.  Past Medical History  Diagnosis Date  . Hypertension   . GERD (gastroesophageal reflux disease)   . Cancer 05-20-13    T1 N1 ER positive/PR negative HER2 negative. right breast  . Breast cancer   . PE (pulmonary embolism)   . Arthritis   . Renal calculi    Past Surgical History  Procedure Laterality Date  . Cesarean section  1991  . Breast biopsy Right 04-05-13  . Breast surgery Right 05/20/13    mastectomy   Family History  Problem Relation Age of Onset  . Lung cancer Father   . Breast cancer Maternal Grandmother 68   Social History:  reports that she has never smoked. She has never used smokeless  tobacco. She reports that she does not drink alcohol or use illicit drugs.  The patient is accompanied by her husband, Richard.  Allergies: No Known Allergies  Current Medications: Current Outpatient Prescriptions  Medication Sig Dispense Refill  . acetaminophen (TYLENOL) 500 MG tablet Take 500 mg by mouth every 6 (six) hours as needed.    . calcium carbonate (OS-CAL) 600 MG TABS tablet Take 600 mg by mouth daily.    . Cholecalciferol (VITAMIN D-3) 1000 UNITS CAPS Take 1 capsule by mouth daily.    . fluticasone (VERAMYST) 27.5 MCG/SPRAY nasal spray Place 1 spray into the nose daily.    Marland Kitchen letrozole (FEMARA) 2.5 MG tablet Take 2.5 mg by mouth daily.    Marland Kitchen omeprazole (PRILOSEC) 20 MG capsule Take 1 capsule (20 mg total) by mouth daily. 90 capsule 1  . potassium chloride (K-DUR) 10 MEQ tablet Take 20 mEq by mouth 2 (two) times daily.    . rivaroxaban (XARELTO) 20 MG TABS tablet Take 20 mg by mouth daily with supper.     No current facility-administered medications for this visit.   Review of Systems:  GENERAL:  Feels good.  Active.  No fevers, sweats or weight loss. PERFORMANCE STATUS (ECOG):  1 HEENT:  No visual changes, runny nose, sore throat, mouth sores or tenderness. Lungs: No shortness of breath or cough.  No hemoptysis. Cardiac:  No chest pain, palpitations, orthopnea, or PND. GI:  No  nausea, vomiting, diarrhea, constipation, melena or hematochezia. GU:  No urgency, frequency, dysuria, or hematuria. Musculoskeletal:  Some cramps.  No back pain.  No joint pain.  No muscle tenderness. Extremities:  No pain or swelling. Skin:  No rashes or skin changes. Neuro:  No headache, numbness or weakness, balance or coordination issues. Endocrine:  No diabetes, thyroid issues, hot flashes or night sweats. Psych:  No mood changes, depression or anxiety. Pain:  No focal pain. Review of systems:  All other systems reviewed and found to be negative.   Physical Exam: Blood pressure 146/84, pulse  99, temperature 98.3 F (36.8 C), temperature source Tympanic, weight 167 lb 12.3 oz (76.1 kg). GENERAL:  Well developed, well nourished woman sitting comfortably in the exam room in no acute distress. MENTAL STATUS:  Alert and oriented to person, place and time. HEAD:  Short dark hair.  Normocephalic, atraumatic, face symmetric, no Cushingoid features. EYES:  Glasses.  Brown eyes.  Pupils equal round and reactive to light and accomodation.  No conjunctivitis or scleral icterus. ENT:  Oropharynx clear without lesion.  Tongue normal. Mucous membranes moist.  RESPIRATORY:  Clear to auscultation without rales, wheezes or rhonchi. CARDIOVASCULAR:  Regular rate and rhythm without murmur, rub or gallop. BREAST:  Right sided mastectomy.  No skin changes, erythema or nodularity.  Left nipple inverted.  Left breast without masses, skin changes or nipple discharge. ABDOMEN:  Soft, non-tender, with active bowel sounds, and no hepatosplenomegaly.  No masses. BACK: No tenderness on percussion of the back. SKIN:  No rashes, ulcers or lesions. EXTREMITIES: No edema, no skin discoloration or tenderness.  No palpable cords. LYMPH NODES: No palpable cervical, supraclavicular, axillary or inguinal adenopathy  NEUROLOGICAL: Unremarkable. PSYCH:  Appropriate.   Appointment on 09/09/2014  Component Date Value Ref Range Status  . WBC 09/09/2014 3.7  3.6 - 11.0 K/uL Final  . RBC 09/09/2014 4.89  3.80 - 5.20 MIL/uL Final  . Hemoglobin 09/09/2014 14.2  12.0 - 16.0 g/dL Final  . HCT 09/09/2014 42.5  35.0 - 47.0 % Final  . MCV 09/09/2014 87.0  80.0 - 100.0 fL Final  . MCH 09/09/2014 29.0  26.0 - 34.0 pg Final  . MCHC 09/09/2014 33.4  32.0 - 36.0 g/dL Final  . RDW 09/09/2014 15.1* 11.5 - 14.5 % Final  . Platelets 09/09/2014 186  150 - 440 K/uL Final  . Neutrophils Relative % 09/09/2014 46   Final  . Neutro Abs 09/09/2014 1.7  1.4 - 6.5 K/uL Final  . Lymphocytes Relative 09/09/2014 41   Final  . Lymphs Abs  09/09/2014 1.5  1.0 - 3.6 K/uL Final  . Monocytes Relative 09/09/2014 11   Final  . Monocytes Absolute 09/09/2014 0.4  0.2 - 0.9 K/uL Final  . Eosinophils Relative 09/09/2014 1   Final  . Eosinophils Absolute 09/09/2014 0.1  0 - 0.7 K/uL Final  . Basophils Relative 09/09/2014 1   Final  . Basophils Absolute 09/09/2014 0.0  0 - 0.1 K/uL Final  . Sodium 09/09/2014 136  135 - 145 mmol/L Final  . Potassium 09/09/2014 3.9  3.5 - 5.1 mmol/L Final  . Chloride 09/09/2014 104  101 - 111 mmol/L Final  . CO2 09/09/2014 26  22 - 32 mmol/L Final  . Glucose, Bld 09/09/2014 160* 65 - 99 mg/dL Final  . BUN 09/09/2014 18  6 - 20 mg/dL Final  . Creatinine, Ser 09/09/2014 1.09* 0.44 - 1.00 mg/dL Final  . Calcium 09/09/2014 9.8  8.9 - 10.3  mg/dL Final  . Total Protein 09/09/2014 8.2* 6.5 - 8.1 g/dL Final  . Albumin 09/09/2014 4.3  3.5 - 5.0 g/dL Final  . AST 09/09/2014 37  15 - 41 U/L Final  . ALT 09/09/2014 50  14 - 54 U/L Final  . Alkaline Phosphatase 09/09/2014 71  38 - 126 U/L Final  . Total Bilirubin 09/09/2014 0.6  0.3 - 1.2 mg/dL Final  . GFR calc non Af Amer 09/09/2014 52* >60 mL/min Final  . GFR calc Af Amer 09/09/2014 >60  >60 mL/min Final   Comment: (NOTE) The eGFR has been calculated using the CKD EPI equation. This calculation has not been validated in all clinical situations. eGFR's persistently <60 mL/min signify possible Chronic Kidney Disease.   . Anion gap 09/09/2014 6  5 - 15 Final  . CA 27.29 09/09/2014 23.6  0.0 - 38.6 U/mL Final   Comment: (NOTE) Bayer Centaur/ACS methodology Performed At: [facility] [address] [place], [place] [id number] Lindon Romp MD CV:[id number]    Assessment:  Abigail Wheeler is a 63 y.o. African American female with stage IIIB multifocal right breast cancer statsus post mastectomy and sentinel lymph node biopsy on 05/19/2013.  Pathology revealed a grade I lobular carcinoma.  The largest focus was 1.7 cm and invaded the pectoralis  muscle.  Additional foci were 1.4 cm and 1 mm (two).  Sentinel lymph node revealed a 1.7 mm focus without extracapsular extension.  Tumor was ER positive, PR negative, and HER-2/neu  negative.  She received 4 cycles of adriamycin and Cytoxan (06/18/2013 - 08/19/2013) followed by 12 weeks of Taxol (09/09/2013 - 11/25/2013).  She received radiation to the chest wall and right axillae from 12/28/2013 - 02/18/2014.  Following completion of radiation, she began Femara.  Left sided mammogram on 04/07/2014 was benign.  Bone density study on 03/22/2014 revealed osteopenia with a T score of -1.6 in the right femur neck and -1.3 in L1-L4 spine.  She is on calcium and vitamin D.  She was diagnosed with multiple left lower pulmonary embolisi on 08/08/2013.  She is on Xarelto.   Symptomatically, she is doing well.  She continues her Femara.  Exam is stable.    Plan: 1. Labs today:  CBC with diff, CMP, CA27.29. 2. Discuss follow-up with PCP regarding ongoing need for potassium supplimentation. 3. Review records regarding diagnosis of pulmonary embolism and work-up (done). 4. Continue Xarelto. 5. Continue Femara. 6. Schedule left sided mammogram 04/08/2015. 7. RTC in 4 months for MD assessment and labs (CBC with diff, CMP, CA27.29).   8. Hypercoagulable work-up at next visit (Factor V Leiden, prothrombin gene mutation, lupus anticoagulant, anticardiolipin antibodies, protein C/S/ATIII).  Lequita Asal, MD  09/09/2014, 4:25 PM

## 2014-09-12 ENCOUNTER — Telehealth: Payer: Self-pay | Admitting: *Deleted

## 2014-09-12 MED ORDER — POTASSIUM CHLORIDE ER 10 MEQ PO TBCR: 20.0000 meq | EXTENDED_RELEASE_TABLET | Freq: Two times a day (BID) | ORAL | Status: DC

## 2014-09-12 NOTE — Telephone Encounter (Signed)
escribed

## 2014-10-26 ENCOUNTER — Telehealth: Payer: Self-pay | Admitting: *Deleted

## 2014-10-26 DIAGNOSIS — I2699 Other pulmonary embolism without acute cor pulmonale: Secondary | ICD-10-CM

## 2014-10-26 DIAGNOSIS — T81718A Complication of other artery following a procedure, not elsewhere classified, initial encounter: Principal | ICD-10-CM

## 2014-10-26 MED ORDER — RIVAROXABAN 20 MG PO TABS: 20.0000 mg | ORAL_TABLET | Freq: Every day | ORAL | Status: DC

## 2014-10-26 NOTE — Telephone Encounter (Signed)
Escribed

## 2014-12-22 ENCOUNTER — Other Ambulatory Visit: Payer: Self-pay | Admitting: Hematology and Oncology

## 2014-12-22 NOTE — Telephone Encounter (Signed)
Escribed

## 2015-01-10 ENCOUNTER — Encounter: Payer: Self-pay | Admitting: Hematology and Oncology

## 2015-01-10 ENCOUNTER — Inpatient Hospital Stay (HOSPITAL_BASED_OUTPATIENT_CLINIC_OR_DEPARTMENT_OTHER): Payer: 59 | Admitting: Hematology and Oncology

## 2015-01-10 ENCOUNTER — Inpatient Hospital Stay: Payer: 59 | Attending: Hematology and Oncology

## 2015-01-10 VITALS — BP 162/86 | HR 75 | Temp 96.6°F | Resp 20 | Ht 61.0 in | Wt 169.3 lb

## 2015-01-10 DIAGNOSIS — Z87442 Personal history of urinary calculi: Secondary | ICD-10-CM

## 2015-01-10 DIAGNOSIS — Z801 Family history of malignant neoplasm of trachea, bronchus and lung: Secondary | ICD-10-CM | POA: Diagnosis not present

## 2015-01-10 DIAGNOSIS — K219 Gastro-esophageal reflux disease without esophagitis: Secondary | ICD-10-CM

## 2015-01-10 DIAGNOSIS — Z170 Estrogen receptor positive status [ER+]: Secondary | ICD-10-CM

## 2015-01-10 DIAGNOSIS — Z79811 Long term (current) use of aromatase inhibitors: Secondary | ICD-10-CM

## 2015-01-10 DIAGNOSIS — M8580 Other specified disorders of bone density and structure, unspecified site: Secondary | ICD-10-CM | POA: Insufficient documentation

## 2015-01-10 DIAGNOSIS — C50911 Malignant neoplasm of unspecified site of right female breast: Secondary | ICD-10-CM

## 2015-01-10 DIAGNOSIS — Z7901 Long term (current) use of anticoagulants: Secondary | ICD-10-CM | POA: Insufficient documentation

## 2015-01-10 DIAGNOSIS — Z86711 Personal history of pulmonary embolism: Secondary | ICD-10-CM | POA: Diagnosis not present

## 2015-01-10 DIAGNOSIS — I10 Essential (primary) hypertension: Secondary | ICD-10-CM | POA: Insufficient documentation

## 2015-01-10 DIAGNOSIS — Z79899 Other long term (current) drug therapy: Secondary | ICD-10-CM | POA: Insufficient documentation

## 2015-01-10 DIAGNOSIS — I2699 Other pulmonary embolism without acute cor pulmonale: Secondary | ICD-10-CM

## 2015-01-10 DIAGNOSIS — M129 Arthropathy, unspecified: Secondary | ICD-10-CM | POA: Insufficient documentation

## 2015-01-10 DIAGNOSIS — Z9011 Acquired absence of right breast and nipple: Secondary | ICD-10-CM | POA: Diagnosis not present

## 2015-01-10 DIAGNOSIS — Z803 Family history of malignant neoplasm of breast: Secondary | ICD-10-CM

## 2015-01-10 LAB — CBC WITH DIFFERENTIAL/PLATELET
Basophils Absolute: 0 10*3/uL (ref 0–0.1)
Basophils Relative: 1 %
Eosinophils Absolute: 0 10*3/uL (ref 0–0.7)
Eosinophils Relative: 1 %
HCT: 42.5 % (ref 35.0–47.0)
Hemoglobin: 14.3 g/dL (ref 12.0–16.0)
Lymphocytes Relative: 43 %
Lymphs Abs: 1.4 10*3/uL (ref 1.0–3.6)
MCH: 29.8 pg (ref 26.0–34.0)
MCHC: 33.7 g/dL (ref 32.0–36.0)
MCV: 88.6 fL (ref 80.0–100.0)
Monocytes Absolute: 0.3 10*3/uL (ref 0.2–0.9)
Monocytes Relative: 11 %
Neutro Abs: 1.4 10*3/uL (ref 1.4–6.5)
Neutrophils Relative %: 44 %
Platelets: 174 10*3/uL (ref 150–440)
RBC: 4.8 MIL/uL (ref 3.80–5.20)
RDW: 14.5 % (ref 11.5–14.5)
WBC: 3.2 10*3/uL — ABNORMAL LOW (ref 3.6–11.0)

## 2015-01-10 LAB — COMPREHENSIVE METABOLIC PANEL
ALT: 51 U/L (ref 14–54)
AST: 41 U/L (ref 15–41)
Albumin: 4 g/dL (ref 3.5–5.0)
Alkaline Phosphatase: 76 U/L (ref 38–126)
Anion gap: 7 (ref 5–15)
BUN: 15 mg/dL (ref 6–20)
CO2: 29 mmol/L (ref 22–32)
Calcium: 10 mg/dL (ref 8.9–10.3)
Chloride: 104 mmol/L (ref 101–111)
Creatinine, Ser: 0.97 mg/dL (ref 0.44–1.00)
GFR calc Af Amer: >60 mL/min (ref >60)
GFR calc non Af Amer: 60 mL/min — ABNORMAL LOW (ref >60)
Glucose, Bld: 161 mg/dL — ABNORMAL HIGH (ref 65–99)
Potassium: 3.7 mmol/L (ref 3.5–5.1)
Sodium: 140 mmol/L (ref 135–145)
Total Bilirubin: 0.8 mg/dL (ref 0.3–1.2)
Total Protein: 7.9 g/dL (ref 6.5–8.1)

## 2015-01-10 NOTE — Progress Notes (Signed)
Patient here today for Breast Cancer follow up. States she has been experiencing numbness in her fingertips. Denies pain or SOB.

## 2015-01-10 NOTE — Progress Notes (Signed)
[facility] day:  01/10/2015   Chief Complaint: Abigail Wheeler is an 64 y.o. female history of stage IIIB right breast cancer who is seen for 4 month assessment on Femara.  HPI: The patient was last seen in the medical oncology clinic on 09/09/2014. At that time she was seen for 3 month assessment on Femara.  She was doing well.  Exam was stable.  CA27.29 was 23.6.  She remained on Xarelto for her history of pulmonary embolism.  A hypercoagulable work-up was discussed.  During the interim, she denies any breast concerns.  She noted for some time, tingling on and off in her fingers.  She continues on her Xarelto.  She denies any bruising or bleeding.  Past Medical History  Diagnosis Date  . Hypertension   . GERD (gastroesophageal reflux disease)   . Cancer 05-20-13    T1 N1 ER positive/PR negative HER2 negative. right breast  . Breast cancer   . PE (pulmonary embolism)   . Arthritis   . Renal calculi    Past Surgical History  Procedure Laterality Date  . Cesarean section  1991  . Breast biopsy Right 04-05-13  . Breast surgery Right 05/20/13    mastectomy   Family History  Problem Relation Age of Onset  . Lung cancer Father   . Breast cancer Maternal Grandmother 68   Social History:  reports that she has never smoked. She has never used smokeless tobacco. She reports that she does not drink alcohol or use illicit drugs.  The patient is accompanied by her husband, Richard.  Allergies: No Known Allergies  Current Medications: Current Outpatient Prescriptions  Medication Sig Dispense Refill  . acetaminophen (TYLENOL) 500 MG tablet Take 500 mg by mouth every 6 (six) hours as needed.    . calcium carbonate (OS-CAL) 600 MG TABS tablet Take 600 mg by mouth daily.    . Cholecalciferol (VITAMIN D-3) 1000 UNITS CAPS Take 1 capsule by mouth daily.    . fluticasone (VERAMYST) 27.5 MCG/SPRAY nasal spray Place 1 spray into the nose daily.    Marland Kitchen  letrozole (FEMARA) 2.5 MG tablet Take 2.5 mg by mouth daily.    Marland Kitchen omeprazole (PRILOSEC) 20 MG capsule Take 1 capsule (20 mg total) by mouth daily. 90 capsule 1  . XARELTO 20 MG TABS tablet TAKE 1 TABLET BY MOUTH DAILY WITH SUPPER. 30 tablet 0  . potassium chloride (K-DUR) 10 MEQ tablet Take 2 tablets (20 mEq total) by mouth 2 (two) times daily. (Patient not taking: Reported on 01/10/2015) 60 tablet 0   No current facility-administered medications for this visit.   Review of Systems:  GENERAL:  Feels "ok".  No fevers, sweats or weight loss. PERFORMANCE STATUS (ECOG):  1 HEENT:  No visual changes, runny nose, sore throat, mouth sores or tenderness. Lungs: No shortness of breath or cough.  No hemoptysis. Cardiac:  No chest pain, palpitations, orthopnea, or PND. GI:  No nausea, vomiting, diarrhea, constipation, melena or hematochezia. GU:  No urgency, frequency, dysuria, or hematuria. Musculoskeletal:  Some cramps.  No back pain.  No joint pain.  No muscle tenderness. Extremities:  No pain or swelling. Skin:  No rashes or skin changes. Neuro:  Off and on tinging in fingertips.  No headache, numbness or weakness, balance or coordination issues. Endocrine:  No diabetes, thyroid issues, hot flashes or night sweats. Psych:  No mood changes, depression or anxiety. Pain:  No focal pain. Review of systems:  All other systems reviewed and found to be negative.   Physical Exam: Blood pressure 162/86, pulse 75, temperature 96.6 F (35.9 C), temperature source Tympanic, resp. rate 20, height $RemoveBe'5\' 1"'[id number]$  (1.549 m), weight 169 lb 5 oz (76.8 kg). GENERAL:  Well developed, well nourished woman sitting comfortably in the exam room in no acute distress. MENTAL STATUS:  Alert and oriented to person, place and time. HEAD:  Short dark hair.  Normocephalic, atraumatic, face symmetric, no Cushingoid features. EYES:  Glasses.  Brown eyes.  Arcus senilis.  Pupils equal round and reactive to light and accomodation.  No  conjunctivitis or scleral icterus. ENT:  Oropharynx clear without lesion.  Torus.  Tongue normal.  Mucous membranes moist.  RESPIRATORY:  Clear to auscultation without rales, wheezes or rhonchi. CARDIOVASCULAR:  Regular rate and rhythm without murmur, rub or gallop. BREAST:  Right sided mastectomy.  No skin changes, erythema or nodularity.  Left nipple inverted.  Fibrocystic changes laterally.  Left breast without masses, skin changes or nipple discharge. ABDOMEN:  Soft, non-tender, with active bowel sounds, and no hepatosplenomegaly.  No masses. SKIN:  No rashes, ulcers or lesions. EXTREMITIES: No edema, no skin discoloration or tenderness.  No palpable cords. LYMPH NODES: No palpable cervical, supraclavicular, axillary or inguinal adenopathy  NEUROLOGICAL: Unremarkable. PSYCH:  Appropriate.   Clinical Support on 01/10/2015  Component Date Value Ref Range Status  . WBC 01/10/2015 3.2* 3.6 - 11.0 K/uL Final  . RBC 01/10/2015 4.80  3.80 - 5.20 MIL/uL Final  . Hemoglobin 01/10/2015 14.3  12.0 - 16.0 g/dL Final  . HCT 01/10/2015 42.5  35.0 - 47.0 % Final  . MCV 01/10/2015 88.6  80.0 - 100.0 fL Final  . MCH 01/10/2015 29.8  26.0 - 34.0 pg Final  . MCHC 01/10/2015 33.7  32.0 - 36.0 g/dL Final  . RDW 01/10/2015 14.5  11.5 - 14.5 % Final  . Platelets 01/10/2015 174  150 - 440 K/uL Final  . Neutrophils Relative % 01/10/2015 44   Final  . Neutro Abs 01/10/2015 1.4  1.4 - 6.5 K/uL Final  . Lymphocytes Relative 01/10/2015 43   Final  . Lymphs Abs 01/10/2015 1.4  1.0 - 3.6 K/uL Final  . Monocytes Relative 01/10/2015 11   Final  . Monocytes Absolute 01/10/2015 0.3  0.2 - 0.9 K/uL Final  . Eosinophils Relative 01/10/2015 1   Final  . Eosinophils Absolute 01/10/2015 0.0  0 - 0.7 K/uL Final  . Basophils Relative 01/10/2015 1   Final  . Basophils Absolute 01/10/2015 0.0  0 - 0.1 K/uL Final   Assessment:  Abigail Wheeler is a 64 y.o. African American female with stage IIIB multifocal right breast  cancer statsus post mastectomy and sentinel lymph node biopsy on 05/19/2013.  Pathology revealed a grade I lobular carcinoma.  The largest focus was 1.7 cm and invaded the pectoralis muscle.  Additional foci were 1.4 cm and 1 mm (two).  Sentinel lymph node revealed a 1.7 mm focus without extracapsular extension.  Tumor was ER positive, PR negative, and HER-2/neu  negative.  She received 4 cycles of adriamycin and Cytoxan (06/18/2013 - 08/19/2013) followed by 12 weeks of Taxol (09/09/2013 - 11/25/2013).  She received radiation to the chest wall and right axillae from 12/28/2013 - 02/18/2014.  Following completion of radiation, she began Femara.  Left sided mammogram on 04/07/2014 was benign.  Bone density study on 03/22/2014 revealed osteopenia with a T score of -1.6 in the right femur neck and -1.3 in  L1-L4 spine.  She is on calcium and vitamin D.  She was diagnosed with multiple left lower pulmonary embolism on 08/08/2013.  She is on Xarelto.   Symptomatically, she is doing well.  She continues her Femara.  Exam is stable.    Plan: 1. Labs today:  CBC with diff, CMP, CA27.29. 2. Hypercoagulable work-up:  Factor V Leiden, prothrombin gene mutation, Protein C activity/antigen, Protein S activity/antigen, ATIII, lupus anticoagulant, anticardiolipin antibodies 3. Continue Xarelto. 4. Continue Femara. 5. Anticipate yearly mammogram on 04/08/2015. 6. RTC in 4 months for MD assessment and labs (CBC with diff, CMP, CA27.29), and review of mammogram.     Lequita Asal, MD  01/10/2015, 10:52 AM

## 2015-01-17 LAB — PROTEIN S, TOTAL: Protein S Ag, Total: 150 % (ref 58–150)

## 2015-01-17 LAB — PROTHROMBIN GENE MUTATION

## 2015-01-17 LAB — CARDIOLIPIN ANTIBODIES, IGG, IGM, IGA
Anticardiolipin IgA: <9 APL U/mL (ref 0–11)
Anticardiolipin IgG: 12 GPL U/mL (ref 0–14)
Anticardiolipin IgM: <9 MPL U/mL (ref 0–12)

## 2015-01-17 LAB — FACTOR 5 LEIDEN

## 2015-01-17 LAB — PROTEIN C ACTIVITY: Protein C Activity: 197 % — ABNORMAL HIGH (ref 74–151)

## 2015-01-17 LAB — LUPUS ANTICOAGULANT PANEL
DRVVT: 39.9 s (ref 0.0–55.1)
PTT Lupus Anticoagulant: 48.7 s (ref 0.0–50.0)

## 2015-01-17 LAB — PROTEIN C, TOTAL: Protein C, Total: 152 % — ABNORMAL HIGH (ref 70–140)

## 2015-01-17 LAB — PROTEIN S ACTIVITY: Protein S Activity: 91 % (ref 60–145)

## 2015-01-17 LAB — CANCER ANTIGEN 27.29: CA 27.29: 19 U/mL (ref 0.0–38.6)

## 2015-01-31 DIAGNOSIS — Z23 Encounter for immunization: Secondary | ICD-10-CM | POA: Diagnosis not present

## 2015-02-21 ENCOUNTER — Other Ambulatory Visit: Payer: Self-pay | Admitting: Hematology and Oncology

## 2015-04-10 ENCOUNTER — Ambulatory Visit: Payer: 59

## 2015-04-13 ENCOUNTER — Other Ambulatory Visit: Payer: Self-pay | Admitting: Hematology and Oncology

## 2015-04-13 ENCOUNTER — Ambulatory Visit
Admission: RE | Admit: 2015-04-13 | Discharge: 2015-04-13 | Disposition: A | Discharge status: Home / Self Care | Payer: 59 | Source: Ambulatory Visit | Attending: Hematology and Oncology | Admitting: Hematology and Oncology

## 2015-04-13 DIAGNOSIS — Z853 Personal history of malignant neoplasm of breast: Secondary | ICD-10-CM | POA: Insufficient documentation

## 2015-04-13 DIAGNOSIS — C50911 Malignant neoplasm of unspecified site of right female breast: Secondary | ICD-10-CM

## 2015-04-13 DIAGNOSIS — Z1231 Encounter for screening mammogram for malignant neoplasm of breast: Secondary | ICD-10-CM | POA: Diagnosis not present

## 2015-04-18 ENCOUNTER — Encounter: Payer: Self-pay | Admitting: General Surgery

## 2015-04-18 ENCOUNTER — Ambulatory Visit (INDEPENDENT_AMBULATORY_CARE_PROVIDER_SITE_OTHER): Payer: 59 | Admitting: General Surgery

## 2015-04-18 ENCOUNTER — Other Ambulatory Visit: Payer: Self-pay | Admitting: *Deleted

## 2015-04-18 VITALS — BP 140/72 | HR 84 | Resp 12 | Ht 61.0 in | Wt 169.0 lb

## 2015-04-18 DIAGNOSIS — C50211 Malignant neoplasm of upper-inner quadrant of right female breast: Secondary | ICD-10-CM

## 2015-04-18 NOTE — Telephone Encounter (Signed)
Per Dr Mike Gip, she needs to contact PCP for this. Patient informed.

## 2015-04-18 NOTE — Patient Instructions (Addendum)
The patient is aware to call back for any questions or concerns.  Follow up in one year with left diagnostic mammogram and office visit.

## 2015-04-18 NOTE — Progress Notes (Signed)
Patient ID: Abigail Wheeler, female   DOB: 29-Jul-1950, 64 y.o.   MRN: [id number]  Chief Complaint  Patient presents with  . Breast Cancer Long Term Follow Up    HPI Abigail Wheeler is a 64 y.o. female.  who presents for follow up breast cancer and breast evaluation. Post right breast mastectomy 2015.The most recent mammogram was done on 04-13-15 .  Patient does perform regular self breast checks and gets regular mammograms done.  Tolerating Letrazole I have reviewed the history of present illness with the patient.   HPI  Past Medical History  Diagnosis Date  . Hypertension   . GERD (gastroesophageal reflux disease)   . Cancer ([place]) 05-20-13    T1 N1 ER positive/PR negative HER2 negative. right breast  . PE (pulmonary embolism)   . Arthritis   . Renal calculi   . Breast cancer ([place]) 2015    chemo and radiation    Past Surgical History  Procedure Laterality Date  . Cesarean section  1991  . Breast surgery Right 05/20/13    mastectomy  . Mastectomy Right 2015    chemo and radiation  . Breast biopsy Right 04-05-13    positive    Family History  Problem Relation Age of Onset  . Lung cancer Father   . Breast cancer Maternal Grandmother 68    Social History Social History  Substance Use Topics  . Smoking status: Never Smoker   . Smokeless tobacco: Never Used  . Alcohol Use: No    No Known Allergies  Current Outpatient Prescriptions  Medication Sig Dispense Refill  . acetaminophen (TYLENOL) 500 MG tablet Take 500 mg by mouth every 6 (six) hours as needed.    . calcium carbonate (OS-CAL) 600 MG TABS tablet Take 600 mg by mouth daily.    . Cholecalciferol (VITAMIN D-3) 1000 UNITS CAPS Take 1 capsule by mouth daily.    . fluticasone (VERAMYST) 27.5 MCG/SPRAY nasal spray Place 1 spray into the nose daily.    Marland Kitchen letrozole (FEMARA) 2.5 MG tablet Take 2.5 mg by mouth daily.    Marland Kitchen omeprazole (PRILOSEC) 20 MG capsule TAKE 1 CAPSULE BY MOUTH DAILY 90 capsule 1  . XARELTO 20  MG TABS tablet TAKE 1 TABLET BY MOUTH DAILY WITH SUPPER. 30 tablet 1   No current facility-administered medications for this visit.    Review of Systems Review of Systems  Constitutional: Negative.   Respiratory: Negative.   Cardiovascular: Negative.     Blood pressure 140/72, pulse 84, resp. rate 12, height '5\' 1"'$  (1.549 Wheeler), weight 169 lb (76.658 kg).  Physical Exam Physical Exam  Constitutional: She is oriented to person, place, and time. She appears well-developed and well-nourished.  HENT:  Mouth/Throat: Oropharynx is clear and moist.  Eyes: Conjunctivae are normal. No scleral icterus.  Neck: Neck supple.  Cardiovascular: Normal rate, regular rhythm and normal heart sounds.   Pulmonary/Chest: Effort normal and breath sounds normal.  Right mastectomy site clean well healed and no sign of local reoccurrence .  Abdominal: Soft. Normal appearance. There is no hepatomegaly. There is no tenderness. A hernia is present.  Small umbilical hernia present.  Lymphadenopathy:    She has no cervical adenopathy.    She has no axillary adenopathy.  Neurological: She is alert and oriented to person, place, and time.  Skin: Skin is warm and dry.  Psychiatric: Her behavior is normal.    Data Reviewed Mammogram left reviewed and stable.  Assessment    She  is 2 yrs post right  Mastectomy and SN biopsy.  Path showed micro met in one SN. Also there was focal muscle involvement , margins were clear. She then completed chemo and radiation. Currently on Letrazole and doing well.    Plan    Follow up in one year with left diagnostic mammogram and office visit. The patient is aware to call back for any questions or concerns.     PCP:  Abigail Wheeler This information has been scribed by Abigail Wheeler RNBC.    Abigail Wheeler 04/18/2015, 1:51 PM

## 2015-04-18 NOTE — Telephone Encounter (Signed)
This was last ordered April 30,2015 by Dr Kallie Edward

## 2015-05-12 ENCOUNTER — Other Ambulatory Visit: Payer: Self-pay

## 2015-05-12 ENCOUNTER — Inpatient Hospital Stay: Payer: 59

## 2015-05-12 ENCOUNTER — Encounter: Payer: Self-pay | Admitting: Hematology and Oncology

## 2015-05-12 ENCOUNTER — Inpatient Hospital Stay: Payer: 59 | Attending: Hematology and Oncology | Admitting: Hematology and Oncology

## 2015-05-12 VITALS — BP 152/84 | HR 92 | Temp 98.6°F | Resp 18 | Ht 61.0 in | Wt 167.8 lb

## 2015-05-12 DIAGNOSIS — Z7901 Long term (current) use of anticoagulants: Secondary | ICD-10-CM | POA: Diagnosis not present

## 2015-05-12 DIAGNOSIS — C50911 Malignant neoplasm of unspecified site of right female breast: Secondary | ICD-10-CM | POA: Diagnosis not present

## 2015-05-12 DIAGNOSIS — M8580 Other specified disorders of bone density and structure, unspecified site: Secondary | ICD-10-CM | POA: Insufficient documentation

## 2015-05-12 DIAGNOSIS — Z170 Estrogen receptor positive status [ER+]: Secondary | ICD-10-CM | POA: Diagnosis not present

## 2015-05-12 DIAGNOSIS — Z87442 Personal history of urinary calculi: Secondary | ICD-10-CM | POA: Insufficient documentation

## 2015-05-12 DIAGNOSIS — I10 Essential (primary) hypertension: Secondary | ICD-10-CM | POA: Diagnosis not present

## 2015-05-12 DIAGNOSIS — Z86711 Personal history of pulmonary embolism: Secondary | ICD-10-CM | POA: Insufficient documentation

## 2015-05-12 DIAGNOSIS — Z79811 Long term (current) use of aromatase inhibitors: Secondary | ICD-10-CM | POA: Insufficient documentation

## 2015-05-12 DIAGNOSIS — Z79899 Other long term (current) drug therapy: Secondary | ICD-10-CM | POA: Diagnosis not present

## 2015-05-12 DIAGNOSIS — Z800 Family history of malignant neoplasm of digestive organs: Secondary | ICD-10-CM | POA: Insufficient documentation

## 2015-05-12 DIAGNOSIS — R51 Headache: Secondary | ICD-10-CM | POA: Insufficient documentation

## 2015-05-12 DIAGNOSIS — Z923 Personal history of irradiation: Secondary | ICD-10-CM | POA: Insufficient documentation

## 2015-05-12 DIAGNOSIS — Z803 Family history of malignant neoplasm of breast: Secondary | ICD-10-CM | POA: Diagnosis not present

## 2015-05-12 DIAGNOSIS — M129 Arthropathy, unspecified: Secondary | ICD-10-CM | POA: Insufficient documentation

## 2015-05-12 DIAGNOSIS — E876 Hypokalemia: Secondary | ICD-10-CM | POA: Diagnosis not present

## 2015-05-12 DIAGNOSIS — Z9221 Personal history of antineoplastic chemotherapy: Secondary | ICD-10-CM | POA: Insufficient documentation

## 2015-05-12 DIAGNOSIS — K219 Gastro-esophageal reflux disease without esophagitis: Secondary | ICD-10-CM | POA: Diagnosis not present

## 2015-05-12 DIAGNOSIS — Z9011 Acquired absence of right breast and nipple: Secondary | ICD-10-CM | POA: Diagnosis not present

## 2015-05-12 LAB — COMPREHENSIVE METABOLIC PANEL
ALT: 47 U/L (ref 14–54)
AST: 33 U/L (ref 15–41)
Albumin: 3.9 g/dL (ref 3.5–5.0)
Alkaline Phosphatase: 78 U/L (ref 38–126)
Anion gap: 5 (ref 5–15)
BUN: 10 mg/dL (ref 6–20)
CO2: 28 mmol/L (ref 22–32)
Calcium: 9.7 mg/dL (ref 8.9–10.3)
Chloride: 105 mmol/L (ref 101–111)
Creatinine, Ser: 0.77 mg/dL (ref 0.44–1.00)
GFR calc Af Amer: >60 mL/min (ref >60)
GFR calc non Af Amer: >60 mL/min (ref >60)
Glucose, Bld: 191 mg/dL — ABNORMAL HIGH (ref 65–99)
Potassium: 3.4 mmol/L — ABNORMAL LOW (ref 3.5–5.1)
Sodium: 138 mmol/L (ref 135–145)
Total Bilirubin: 0.7 mg/dL (ref 0.3–1.2)
Total Protein: 8 g/dL (ref 6.5–8.1)

## 2015-05-12 LAB — CBC WITH DIFFERENTIAL/PLATELET
Basophils Absolute: 0 10*3/uL (ref 0–0.1)
Basophils Relative: 1 %
Eosinophils Absolute: 0 10*3/uL (ref 0–0.7)
Eosinophils Relative: 1 %
HCT: 40.8 % (ref 35.0–47.0)
Hemoglobin: 13.8 g/dL (ref 12.0–16.0)
Lymphocytes Relative: 42 %
Lymphs Abs: 1.5 10*3/uL (ref 1.0–3.6)
MCH: 29.5 pg (ref 26.0–34.0)
MCHC: 33.7 g/dL (ref 32.0–36.0)
MCV: 87.3 fL (ref 80.0–100.0)
Monocytes Absolute: 0.3 10*3/uL (ref 0.2–0.9)
Monocytes Relative: 9 %
Neutro Abs: 1.7 10*3/uL (ref 1.4–6.5)
Neutrophils Relative %: 47 %
Platelets: 185 10*3/uL (ref 150–440)
RBC: 4.68 MIL/uL (ref 3.80–5.20)
RDW: 14.4 % (ref 11.5–14.5)
WBC: 3.5 10*3/uL — ABNORMAL LOW (ref 3.6–11.0)

## 2015-05-12 NOTE — Progress Notes (Addendum)
[facility] day:  05/12/2015   Chief Complaint: Abigail Wheeler is an 65 y.o. female history of stage IIIB right breast cancer who is seen for 4 month assessment on Femara.  HPI: The patient was last seen in the medical oncology clinic on 01/10/2015. At that time she was seen for 4 month assessment on Femara.  She denied any breast concerns.  Exam was unremarkable.  CA27.29 was 19.0 (normal).  A hypercoagulable work-up was performed.  The following studies were negative:  Factor V Leiden, prothrombin gene mutation, lupus anticoagulant panel, anticardiolipin antibodies, protein C 152%, protein C activity 197%, protein S antigen 150%, and protein S activity 91%.  Left sided mammogram on 04/13/2015 revealed no evidence of malignancy.  During the interim, she has had no breast concerns. She has a nagging headache just today.  She continues on her Xarelto.  She denies any bruising or bleeding.    Past Medical History  Diagnosis Date  . Hypertension   . GERD (gastroesophageal reflux disease)   . Cancer ([place]) 05-20-13    T1 N1 ER positive/PR negative HER2 negative. right breast  . PE (pulmonary embolism)   . Arthritis   . Renal calculi   . Breast cancer ([place]) 2015    chemo and radiation   Past Surgical History  Procedure Laterality Date  . Cesarean section  1991  . Breast surgery Right 05/20/13    mastectomy  . Mastectomy Right 2015    chemo and radiation  . Breast biopsy Right 04-05-13    positive   Family History  Problem Relation Age of Onset  . Lung cancer Father   . Breast cancer Maternal Grandmother 68   Social History:  reports that she has never smoked. She has never used smokeless tobacco. She reports that she does not drink alcohol or use illicit drugs.  The patient is accompanied by her husband, Abigail Wheeler.  Allergies: No Known Allergies  Current Medications: Current Outpatient Prescriptions  Medication Sig Dispense Refill   . acetaminophen (TYLENOL) 500 MG tablet Take 500 mg by mouth every 6 (six) hours as needed.    . calcium carbonate (OS-CAL) 600 MG TABS tablet Take 600 mg by mouth daily.    . Cholecalciferol (VITAMIN D-3) 1000 UNITS CAPS Take 1 capsule by mouth daily.    . fluticasone (VERAMYST) 27.5 MCG/SPRAY nasal spray Place 1 spray into the nose daily.    Marland Kitchen letrozole (FEMARA) 2.5 MG tablet Take 2.5 mg by mouth daily.    Marland Kitchen omeprazole (PRILOSEC) 20 MG capsule TAKE 1 CAPSULE BY MOUTH DAILY 90 capsule 1  . XARELTO 20 MG TABS tablet TAKE 1 TABLET BY MOUTH DAILY WITH SUPPER. 30 tablet 1   No current facility-administered medications for this visit.   Review of Systems:  GENERAL:  Feels "ok".  No fevers, sweats or weight loss. PERFORMANCE STATUS (ECOG):  1 HEENT:  No visual changes, runny nose, sore throat, mouth sores or tenderness. Lungs: No shortness of breath or cough.  No hemoptysis. Cardiac:  No chest pain, palpitations, orthopnea, or PND. GI:  No nausea, vomiting, diarrhea, constipation, melena or hematochezia. GU:  No urgency, frequency, dysuria, or hematuria. Musculoskeletal:  No back pain.  No joint pain.  No muscle tenderness. Extremities:  No pain or swelling. Skin:  No rashes or skin changes. Neuro:  Nagging headache.  Nonumbness or weakness, balance or coordination issues. Endocrine:  No diabetes, thyroid issues, hot flashes or night sweats. Psych:  No mood changes, depression or anxiety. Pain:  No focal pain. Review of systems:  All other systems reviewed and found to be negative.   Physical Exam: Blood pressure 152/84, pulse 92, temperature 98.6 F (37 C), temperature source Tympanic, resp. rate 18, height '5\' 1"'$  (1.549 m), weight 167 lb 12.3 oz (76.1 kg). GENERAL:  Well developed, well nourished woman sitting comfortably in the exam room in no acute distress. MENTAL STATUS:  Alert and oriented to person, place and time. HEAD:  Short dark hair.  Normocephalic, atraumatic, face  symmetric, no Cushingoid features. EYES:  Glasses.  Brown eyes.  Arcus senilis.  Pupils equal round and reactive to light and accomodation.  No conjunctivitis or scleral icterus. ENT:  Oropharynx clear without lesion.  Torus.  Tongue normal.  Mucous membranes moist.  RESPIRATORY:  Clear to auscultation without rales, wheezes or rhonchi. CARDIOVASCULAR:  Regular rate and rhythm without murmur, rub or gallop. BREAST:  Right sided mastectomy.  No skin changes, erythema or nodularity.  Left nipple inverted.  Minimal fibrocystic changes laterally.  Left breast without masses, skin changes or nipple discharge. ABDOMEN:  Soft, non-tender, with active bowel sounds, and no hepatosplenomegaly.  No masses. SKIN:  No rashes, ulcers or lesions. EXTREMITIES: No edema, no skin discoloration or tenderness.  No palpable cords. LYMPH NODES: No palpable cervical, supraclavicular, axillary or inguinal adenopathy  NEUROLOGICAL: Unremarkable. PSYCH:  Appropriate.   Appointment on 05/12/2015  Component Date Value Ref Range Status  . WBC 05/12/2015 3.5* 3.6 - 11.0 K/uL Final  . RBC 05/12/2015 4.68  3.80 - 5.20 MIL/uL Final  . Hemoglobin 05/12/2015 13.8  12.0 - 16.0 g/dL Final  . HCT 05/12/2015 40.8  35.0 - 47.0 % Final  . MCV 05/12/2015 87.3  80.0 - 100.0 fL Final  . MCH 05/12/2015 29.5  26.0 - 34.0 pg Final  . MCHC 05/12/2015 33.7  32.0 - 36.0 g/dL Final  . RDW 05/12/2015 14.4  11.5 - 14.5 % Final  . Platelets 05/12/2015 185  150 - 440 K/uL Final  . Neutrophils Relative % 05/12/2015 47   Final  . Neutro Abs 05/12/2015 1.7  1.4 - 6.5 K/uL Final  . Lymphocytes Relative 05/12/2015 42   Final  . Lymphs Abs 05/12/2015 1.5  1.0 - 3.6 K/uL Final  . Monocytes Relative 05/12/2015 9   Final  . Monocytes Absolute 05/12/2015 0.3  0.2 - 0.9 K/uL Final  . Eosinophils Relative 05/12/2015 1   Final  . Eosinophils Absolute 05/12/2015 0.0  0 - 0.7 K/uL Final  . Basophils Relative 05/12/2015 1   Final  . Basophils Absolute  05/12/2015 0.0  0 - 0.1 K/uL Final  . Sodium 05/12/2015 138  135 - 145 mmol/L Final  . Potassium 05/12/2015 3.4* 3.5 - 5.1 mmol/L Final  . Chloride 05/12/2015 105  101 - 111 mmol/L Final  . CO2 05/12/2015 28  22 - 32 mmol/L Final  . Glucose, Bld 05/12/2015 191* 65 - 99 mg/dL Final  . BUN 05/12/2015 10  6 - 20 mg/dL Final  . Creatinine, Ser 05/12/2015 0.77  0.44 - 1.00 mg/dL Final  . Calcium 05/12/2015 9.7  8.9 - 10.3 mg/dL Final  . Total Protein 05/12/2015 8.0  6.5 - 8.1 g/dL Final  . Albumin 05/12/2015 3.9  3.5 - 5.0 g/dL Final  . AST 05/12/2015 33  15 - 41 U/L Final  . ALT 05/12/2015 47  14 - 54 U/L Final  . Alkaline Phosphatase 05/12/2015 78  38 - 126  U/L Final  . Total Bilirubin 05/12/2015 0.7  0.3 - 1.2 mg/dL Final  . GFR calc non Af Amer 05/12/2015 >60  >60 mL/min Final  . GFR calc Af Amer 05/12/2015 >60  >60 mL/min Final   Comment: (NOTE) The eGFR has been calculated using the CKD EPI equation. This calculation has not been validated in all clinical situations. eGFR's persistently <60 mL/min signify possible Chronic Kidney Disease.   . Anion gap 05/12/2015 5  5 - 15 Final   Assessment:  JOZEE HAMMER is a 65 y.o. African American female with stage IIIB multifocal right breast cancer statsus post mastectomy and sentinel lymph node biopsy on 05/19/2013.  Pathology revealed a grade I lobular carcinoma.  The largest focus was 1.7 cm and invaded the pectoralis muscle.  Additional foci were 1.4 cm and 1 mm (two).  Sentinel lymph node revealed a 1.7 mm focus without extracapsular extension.  Tumor was ER positive, PR negative, and HER-2/neu  negative.  She received 4 cycles of adriamycin and Cytoxan (06/18/2013 - 08/19/2013) followed by 12 weeks of Taxol (09/09/2013 - 11/25/2013).  She received radiation to the chest wall and right axillae from 12/28/2013 - 02/18/2014.  Following completion of radiation, she began Femara.  CA27.29 was 19 on 01/10/2015.  Left sided ammogram on  04/13/2015 revealed no evidence of malignancy.  Bone density study on 03/22/2014 revealed osteopenia with a T score of -1.6 in the right femur neck and -1.3 in L1-L4 spine.  She is on calcium and vitamin D.  She was diagnosed with multiple left lower pulmonary embolism on 08/08/2013.  Hypercoagulable work-up on 01/10/2015 was normal (Factor V Leiden, prothrombin gene mutation, lupus anticoagulant panel, anticardiolipin antibodies, protein C antigen/activity, and protein S antigen/activity).  She is on Xarelto.   Symptomatically, she is doing well on Femara.  Exam is stable.  Her potassium is slightly low.  Plan: 1. Labs today:  CBC with diff, CMP, CA27.29. 2. Review mammogram-done. 3. Continue Femara. 4. Discuss low potassium.  Patient eats bananas for potassium supplimentation rather than take pills. 5. RTC in 4 months for MD assessment and labs (CBC with diff, CMP, CA27.29), and review of mammogram.     Lequita Asal, MD  05/12/2015, 10:54 AM

## 2015-05-12 NOTE — Progress Notes (Signed)
Patient is here for follow-up of breast cancer. Patient's last mammogram was in December 2016. Patient states that overall she has been doing well and offers no complaints.

## 2015-05-13 LAB — CANCER ANTIGEN 27.29: CA 27.29: 13.5 U/mL (ref 0.0–38.6)

## 2015-05-24 ENCOUNTER — Other Ambulatory Visit: Payer: Self-pay | Admitting: Hematology and Oncology

## 2015-07-05 DIAGNOSIS — M25551 Pain in right hip: Secondary | ICD-10-CM | POA: Diagnosis not present

## 2015-07-06 DIAGNOSIS — M25551 Pain in right hip: Secondary | ICD-10-CM | POA: Diagnosis not present

## 2015-07-13 DIAGNOSIS — H2513 Age-related nuclear cataract, bilateral: Secondary | ICD-10-CM | POA: Diagnosis not present

## 2015-08-22 ENCOUNTER — Other Ambulatory Visit: Payer: Self-pay | Admitting: Hematology and Oncology

## 2015-09-11 ENCOUNTER — Other Ambulatory Visit: Payer: 59

## 2015-09-11 ENCOUNTER — Ambulatory Visit: Payer: 59 | Admitting: Hematology and Oncology

## 2015-09-21 ENCOUNTER — Ambulatory Visit
Admission: RE | Admit: 2015-09-21 | Discharge: 2015-09-21 | Disposition: A | Discharge status: Home / Self Care | Payer: 59 | Source: Ambulatory Visit | Attending: Radiation Oncology | Admitting: Radiation Oncology

## 2015-09-21 ENCOUNTER — Encounter: Payer: Self-pay | Admitting: Radiation Oncology

## 2015-09-21 VITALS — BP 158/87 | HR 89 | Temp 96.9°F | Resp 20 | Wt 170.4 lb

## 2015-09-21 DIAGNOSIS — Z170 Estrogen receptor positive status [ER+]: Secondary | ICD-10-CM | POA: Insufficient documentation

## 2015-09-21 DIAGNOSIS — C50911 Malignant neoplasm of unspecified site of right female breast: Secondary | ICD-10-CM | POA: Diagnosis not present

## 2015-09-21 DIAGNOSIS — Z86711 Personal history of pulmonary embolism: Secondary | ICD-10-CM | POA: Insufficient documentation

## 2015-09-21 DIAGNOSIS — Z79811 Long term (current) use of aromatase inhibitors: Secondary | ICD-10-CM | POA: Insufficient documentation

## 2015-09-21 DIAGNOSIS — Z7901 Long term (current) use of anticoagulants: Secondary | ICD-10-CM | POA: Insufficient documentation

## 2015-09-21 DIAGNOSIS — C779 Secondary and unspecified malignant neoplasm of lymph node, unspecified: Secondary | ICD-10-CM | POA: Diagnosis not present

## 2015-09-21 DIAGNOSIS — C50211 Malignant neoplasm of upper-inner quadrant of right female breast: Secondary | ICD-10-CM

## 2015-09-21 DIAGNOSIS — E119 Type 2 diabetes mellitus without complications: Secondary | ICD-10-CM | POA: Diagnosis not present

## 2015-09-21 DIAGNOSIS — Z9221 Personal history of antineoplastic chemotherapy: Secondary | ICD-10-CM | POA: Diagnosis not present

## 2015-09-21 DIAGNOSIS — Z923 Personal history of irradiation: Secondary | ICD-10-CM | POA: Insufficient documentation

## 2015-09-21 NOTE — Progress Notes (Signed)
Radiation Oncology Follow up Note  Name: Abigail Wheeler   Date:   09/21/2015 MRN:  [id number] DOB: 09/11/1950    This 65 y.o. female presents to the clinic today for follow-up of multifocal right breast cancer.  REFERRING PROVIDER: Lavera Guise, MD  HPI: This is a pleasant 65 year old woman with stage IIIB (multifocal ER positive, PR negative, HER2/neu not over-expressed) invasive lobular carcinoma of the right breast. She had micrometastasis in sentinel lymph node.Tumor was stage IIIB based on invasion of pectoralis muscle. She presented with an abnormal mammogram.   She is status post right modified radical mastectomy January 2015 followed by 4 cycles of adjuvant Adriamycin/ Cytoxan and 12 weeks of Taxol. She then received right chest wall, peripheral lymphatic radiation therapy.  She is seen today in routine follow-up and is doing well. She is on adjuvant aromatase inhibitor therapy with letrozole and tolerating this well. Her health has been stable since her last visit here one year ago. She had a negative screening left mammogram in December, 2016 and saw Dr. Jamal Collin at that time.  She does self breast exam and monitors the chest wall area noting no new lumps or issues. She has had no problems with arm mobility or lymphedema. She denies cough or SOB. She did have  a pulmonary embolism in 2015 and remains on Xarelto. She denies pain.  COMPLICATIONS OF TREATMENT: none  FOLLOW UP COMPLIANCE: keeps appointments.  PHYSICAL EXAM:  BP 158/87 mmHg  Pulse 89  Temp(Src) 96.9 F (36.1 C)  Resp 20  Wt 170 lb 6.7 oz (77.3 kg) Well-developed well nourished woman in NAD.  No axillary or supraclavicular adenopathy. No lymphedema and full range of motion right upper extremity. She status post right modified radical mastectomy. Chest wall still shows some tanning post radiation treatment, no telangiectasias, no nodularity or rash. Left breast soft glandular, no dominant mass. No pagetoid changes. Lungs  are clear. Heart shows regular rate and rhythm, no murmur, gallop, or rub. Abdomen soft, nontender, no organomegaly or masses noted. Motor/sensory grossly intact and symmetric in the upper and lower extremities. Cranial nerves II through XII grossly intact. Patient alert and oriented x 3.  No lower extremity edema.   RADIOLOGY RESULTS: Mammogram December, 2016 left breast benign.  PLAN: She continues to do well with no evidence of disease.She has follow-up and labs scheduled next week with medical oncology.  I have asked to see her back in this clinic in 1 year for follow-up. Patient knows to call with any concerns.

## 2015-09-26 ENCOUNTER — Inpatient Hospital Stay: Payer: 59 | Attending: Hematology and Oncology

## 2015-09-26 ENCOUNTER — Inpatient Hospital Stay (HOSPITAL_BASED_OUTPATIENT_CLINIC_OR_DEPARTMENT_OTHER): Payer: 59 | Admitting: Hematology and Oncology

## 2015-09-26 ENCOUNTER — Encounter: Payer: Self-pay | Admitting: Hematology and Oncology

## 2015-09-26 ENCOUNTER — Telehealth: Payer: Self-pay | Admitting: *Deleted

## 2015-09-26 VITALS — BP 162/88 | HR 92 | Temp 97.8°F | Ht 61.0 in | Wt 170.2 lb

## 2015-09-26 DIAGNOSIS — Z923 Personal history of irradiation: Secondary | ICD-10-CM | POA: Diagnosis not present

## 2015-09-26 DIAGNOSIS — Z9221 Personal history of antineoplastic chemotherapy: Secondary | ICD-10-CM | POA: Diagnosis not present

## 2015-09-26 DIAGNOSIS — I10 Essential (primary) hypertension: Secondary | ICD-10-CM

## 2015-09-26 DIAGNOSIS — Z801 Family history of malignant neoplasm of trachea, bronchus and lung: Secondary | ICD-10-CM

## 2015-09-26 DIAGNOSIS — M129 Arthropathy, unspecified: Secondary | ICD-10-CM | POA: Insufficient documentation

## 2015-09-26 DIAGNOSIS — Z170 Estrogen receptor positive status [ER+]: Secondary | ICD-10-CM | POA: Diagnosis not present

## 2015-09-26 DIAGNOSIS — Z79899 Other long term (current) drug therapy: Secondary | ICD-10-CM | POA: Diagnosis not present

## 2015-09-26 DIAGNOSIS — Z803 Family history of malignant neoplasm of breast: Secondary | ICD-10-CM

## 2015-09-26 DIAGNOSIS — Z7901 Long term (current) use of anticoagulants: Secondary | ICD-10-CM | POA: Insufficient documentation

## 2015-09-26 DIAGNOSIS — Z86711 Personal history of pulmonary embolism: Secondary | ICD-10-CM

## 2015-09-26 DIAGNOSIS — Z79811 Long term (current) use of aromatase inhibitors: Secondary | ICD-10-CM | POA: Diagnosis not present

## 2015-09-26 DIAGNOSIS — C50911 Malignant neoplasm of unspecified site of right female breast: Secondary | ICD-10-CM | POA: Diagnosis not present

## 2015-09-26 DIAGNOSIS — Z87442 Personal history of urinary calculi: Secondary | ICD-10-CM

## 2015-09-26 DIAGNOSIS — M8580 Other specified disorders of bone density and structure, unspecified site: Secondary | ICD-10-CM | POA: Insufficient documentation

## 2015-09-26 DIAGNOSIS — I2699 Other pulmonary embolism without acute cor pulmonale: Secondary | ICD-10-CM

## 2015-09-26 DIAGNOSIS — I4891 Unspecified atrial fibrillation: Secondary | ICD-10-CM | POA: Diagnosis not present

## 2015-09-26 DIAGNOSIS — E876 Hypokalemia: Secondary | ICD-10-CM

## 2015-09-26 LAB — COMPREHENSIVE METABOLIC PANEL
ALT: 50 U/L (ref 14–54)
AST: 46 U/L — ABNORMAL HIGH (ref 15–41)
Albumin: 4 g/dL (ref 3.5–5.0)
Alkaline Phosphatase: 83 U/L (ref 38–126)
Anion gap: 8 (ref 5–15)
BUN: 12 mg/dL (ref 6–20)
CO2: 26 mmol/L (ref 22–32)
Calcium: 9.5 mg/dL (ref 8.9–10.3)
Chloride: 104 mmol/L (ref 101–111)
Creatinine, Ser: 0.97 mg/dL (ref 0.44–1.00)
GFR calc Af Amer: >60 mL/min (ref >60)
GFR calc non Af Amer: 60 mL/min — ABNORMAL LOW (ref >60)
Glucose, Bld: 196 mg/dL — ABNORMAL HIGH (ref 65–99)
Potassium: 3.2 mmol/L — ABNORMAL LOW (ref 3.5–5.1)
Sodium: 138 mmol/L (ref 135–145)
Total Bilirubin: 0.6 mg/dL (ref 0.3–1.2)
Total Protein: 7.9 g/dL (ref 6.5–8.1)

## 2015-09-26 LAB — CBC WITH DIFFERENTIAL/PLATELET
Basophils Absolute: 0 10*3/uL (ref 0–0.1)
Basophils Relative: 1 %
Eosinophils Absolute: 0 10*3/uL (ref 0–0.7)
Eosinophils Relative: 1 %
HCT: 41.1 % (ref 35.0–47.0)
Hemoglobin: 14.1 g/dL (ref 12.0–16.0)
Lymphocytes Relative: 43 %
Lymphs Abs: 1.9 10*3/uL (ref 1.0–3.6)
MCH: 30.1 pg (ref 26.0–34.0)
MCHC: 34.3 g/dL (ref 32.0–36.0)
MCV: 87.8 fL (ref 80.0–100.0)
Monocytes Absolute: 0.4 10*3/uL (ref 0.2–0.9)
Monocytes Relative: 10 %
Neutro Abs: 2 10*3/uL (ref 1.4–6.5)
Neutrophils Relative %: 45 %
Platelets: 172 10*3/uL (ref 150–440)
RBC: 4.68 MIL/uL (ref 3.80–5.20)
RDW: 14 % (ref 11.5–14.5)
WBC: 4.4 10*3/uL (ref 3.6–11.0)

## 2015-09-26 MED ORDER — POTASSIUM CHLORIDE CRYS ER 20 MEQ PO TBCR: 20.0000 meq | EXTENDED_RELEASE_TABLET | Freq: Every day | ORAL | Status: DC

## 2015-09-26 NOTE — Progress Notes (Signed)
[facility] day:  09/26/2015   Chief Complaint: Abigail Wheeler is an 65 y.o. female history of stage IIIB right breast cancer who is seen for 4 month assessment on Femara.  HPI: The patient was last seen in the medical oncology clinic on 05/12/2015.  At that time,she denied any breast concerns.  Exam was stable.  Labs included a normal CBC with diff, CMP (except for potassium of 3.4), and CA27.29 (13.5).  She was to continue her Femara.  We discussed potassium rich foods.  Right sided mammogram on 04/13/2015 was negative.  Symptomatically, she denies any complaints.  She continues on her Xarelto.  She denies any bruising or bleeding.     Past Medical History  Diagnosis Date  . Hypertension   . GERD (gastroesophageal reflux disease)   . Cancer ([place]) 05-20-13    T1 N1 ER positive/PR negative HER2 negative. right breast  . PE (pulmonary embolism)   . Arthritis   . Renal calculi   . Breast cancer ([place]) 2015    chemo and radiation   Past Surgical History  Procedure Laterality Date  . Cesarean section  1991  . Breast surgery Right 05/20/13    mastectomy  . Mastectomy Right 2015    chemo and radiation  . Breast biopsy Right 04-05-13    positive   Family History  Problem Relation Age of Onset  . Lung cancer Father   . Breast cancer Maternal Grandmother 68   Social History:  reports that she has never smoked. She has never used smokeless tobacco. She reports that she does not drink alcohol or use illicit drugs.  The patient is accompanied by her husband, Abigail Wheeler.  Allergies: No Known Allergies  Current Medications: Current Outpatient Prescriptions  Medication Sig Dispense Refill  . acetaminophen (TYLENOL) 500 MG tablet Take 500 mg by mouth every 6 (six) hours as needed.    . calcium carbonate (OS-CAL) 600 MG TABS tablet Take 600 mg by mouth daily.    . Cholecalciferol (VITAMIN D-3) 1000 UNITS CAPS Take 1 capsule by mouth daily.     . fluticasone (VERAMYST) 27.5 MCG/SPRAY nasal spray Place 1 spray into the nose daily. Reported on 09/21/2015    . letrozole (FEMARA) 2.5 MG tablet Take 2.5 mg by mouth daily.    Marland Kitchen omeprazole (PRILOSEC) 20 MG capsule TAKE 1 CAPSULE BY MOUTH DAILY 90 capsule 1  . XARELTO 20 MG TABS tablet TAKE 1 TABLET BY MOUTH DAILY WITH SUPPER. 30 tablet 2   No current facility-administered medications for this visit.   Review of Systems:  GENERAL:  Feels good.  No fevers or sweats.  Weight up 3 pounds. PERFORMANCE STATUS (ECOG):  0 HEENT:  No visual changes, runny nose, sore throat, mouth sores or tenderness. Lungs: No shortness of breath or cough.  No hemoptysis. Cardiac:  No chest pain, palpitations, orthopnea, or PND. GI:  No nausea, vomiting, diarrhea, constipation, melena or hematochezia. GU:  No urgency, frequency, dysuria, or hematuria. Musculoskeletal:  No back pain.  No joint pain.  No muscle tenderness. Extremities:  No pain or swelling. Skin:  No rashes or skin changes. Neuro:  No headache, numbness or weakness, balance or coordination issues. Endocrine:  No diabetes, thyroid issues, hot flashes or night sweats. Psych:  No mood changes, depression or anxiety. Pain:  No focal pain. Review of systems:  All other systems reviewed and found to be negative.   Physical Exam: Blood pressure 162/88, pulse  92, temperature 97.8 F (36.6 C), temperature source Tympanic, height '5\' 1"'$  (1.549 m), weight 170 lb 3.1 oz (77.2 kg). GENERAL:  Well developed, well nourished woman sitting comfortably in the exam room in no acute distress. MENTAL STATUS:  Alert and oriented to person, place and time. HEAD:  Short dark hair.  Normocephalic, atraumatic, face symmetric, no Cushingoid features. EYES:  Glasses.  Brown eyes.  Arcus senilis.  Pupils equal round and reactive to light and accomodation.  No conjunctivitis or scleral icterus. ENT:  Oropharynx clear without lesion.  Torus.  Tongue normal.  Mucous membranes  moist.  RESPIRATORY:  Clear to auscultation without rales, wheezes or rhonchi. CARDIOVASCULAR:  Regular rate and rhythm without murmur, rub or gallop. BREAST:  Right sided mastectomy.  No skin changes, erythema or nodularity.  Left nipple inverted.  Fibrocystic changes superiorly and laterally.  No masses, skin changes or nipple discharge. ABDOMEN:  Soft, non-tender, with active bowel sounds, and no hepatosplenomegaly.  No masses. SKIN:  No rashes, ulcers or lesions. EXTREMITIES: No edema, no skin discoloration or tenderness.  No palpable cords. LYMPH NODES: No palpable cervical, supraclavicular, axillary or inguinal adenopathy  NEUROLOGICAL: Unremarkable. PSYCH:  Appropriate.   Appointment on 09/26/2015  Component Date Value Ref Range Status  . WBC 09/26/2015 4.4  3.6 - 11.0 K/uL Final  . RBC 09/26/2015 4.68  3.80 - 5.20 MIL/uL Final  . Hemoglobin 09/26/2015 14.1  12.0 - 16.0 g/dL Final  . HCT 09/26/2015 41.1  35.0 - 47.0 % Final  . MCV 09/26/2015 87.8  80.0 - 100.0 fL Final  . MCH 09/26/2015 30.1  26.0 - 34.0 pg Final  . MCHC 09/26/2015 34.3  32.0 - 36.0 g/dL Final  . RDW 09/26/2015 14.0  11.5 - 14.5 % Final  . Platelets 09/26/2015 172  150 - 440 K/uL Final  . Neutrophils Relative % 09/26/2015 45   Final  . Neutro Abs 09/26/2015 2.0  1.4 - 6.5 K/uL Final  . Lymphocytes Relative 09/26/2015 43   Final  . Lymphs Abs 09/26/2015 1.9  1.0 - 3.6 K/uL Final  . Monocytes Relative 09/26/2015 10   Final  . Monocytes Absolute 09/26/2015 0.4  0.2 - 0.9 K/uL Final  . Eosinophils Relative 09/26/2015 1   Final  . Eosinophils Absolute 09/26/2015 0.0  0 - 0.7 K/uL Final  . Basophils Relative 09/26/2015 1   Final  . Basophils Absolute 09/26/2015 0.0  0 - 0.1 K/uL Final  . Sodium 09/26/2015 138  135 - 145 mmol/L Final  . Potassium 09/26/2015 3.2* 3.5 - 5.1 mmol/L Final  . Chloride 09/26/2015 104  101 - 111 mmol/L Final  . CO2 09/26/2015 26  22 - 32 mmol/L Final  . Glucose, Bld 09/26/2015 196* 65 -  99 mg/dL Final  . BUN 09/26/2015 12  6 - 20 mg/dL Final  . Creatinine, Ser 09/26/2015 0.97  0.44 - 1.00 mg/dL Final  . Calcium 09/26/2015 9.5  8.9 - 10.3 mg/dL Final  . Total Protein 09/26/2015 7.9  6.5 - 8.1 g/dL Final  . Albumin 09/26/2015 4.0  3.5 - 5.0 g/dL Final  . AST 09/26/2015 46* 15 - 41 U/L Final  . ALT 09/26/2015 50  14 - 54 U/L Final  . Alkaline Phosphatase 09/26/2015 83  38 - 126 U/L Final  . Total Bilirubin 09/26/2015 0.6  0.3 - 1.2 mg/dL Final  . GFR calc non Af Amer 09/26/2015 60* >60 mL/min Final  . GFR calc Af Amer 09/26/2015 >60  >60  mL/min Final   Comment: (NOTE) The eGFR has been calculated using the CKD EPI equation. This calculation has not been validated in all clinical situations. eGFR's persistently <60 mL/min signify possible Chronic Kidney Disease.   . Anion gap 09/26/2015 8  5 - 15 Final   Assessment:  Abigail Wheeler is a 65 y.o. African American female with stage IIIB multifocal right breast cancer statsus post mastectomy and sentinel lymph node biopsy on 05/19/2013.  Pathology revealed a grade I lobular carcinoma.  The largest focus was 1.7 cm and invaded the pectoralis muscle.  Additional foci were 1.4 cm and 1 mm (two).  Sentinel lymph node revealed a 1.7 mm focus without extracapsular extension.  Tumor was ER positive, PR negative, and HER-2/neu  negative.  She received 4 cycles of adriamycin and Cytoxan (06/18/2013 - 08/19/2013) followed by 12 weeks of Taxol (09/09/2013 - 11/25/2013).  She received radiation to the chest wall and right axillae from 12/28/2013 - 02/18/2014.  Following completion of radiation, she began Femara.  Left sided mammogram on 04/13/2015 revealed no evidence of malignancy.  CA27.29 was 19 on 01/10/2015 and 13.5 on 05/12/2015.  Bone density study on 03/22/2014 revealed osteopenia with a T score of -1.6 in the right femur neck and -1.3 in L1-L4 spine.  She is on calcium and vitamin D.  She was diagnosed with multiple left lower  pulmonary embolism on 08/08/2013.  Hypercoagulable work-up on 01/10/2015 was normal (Factor V Leiden, prothrombin gene mutation, lupus anticoagulant panel, anticardiolipin antibodies, protein C antigen/activity, and protein S antigen/activity).  She is on Xarelto.   Symptomatically, she is doing well on Femara.  Exam is unremarkable.  Her potassium is low (3.2).  Plan: 1. Labs today:  CBC with diff, CMP, CA27.29. 2. Continue Femara. 3. Discuss recurrent low potassium.  Previously discussed potassium rich foods.  Patient denies diuretics or diarrhea.  Discuss follow-up with primary care physician (Dr. Clayborn Bigness).  Will provide short term potassium supplementation. 4. Rx:  potassium 20 meq po q day x 3 days. 5. RTC in 4 months for MD assessment and labs (CBC with diff, CMP, CA27.29).   6. Anticipate follow-up every 6 months after next visit.   Lequita Asal, MD  09/26/2015, 12:17 PM

## 2015-09-26 NOTE — Telephone Encounter (Signed)
Pt contacted about getting her an appt with her PCP Dr. Clayborn Bigness.  Pt has not been there since 2014 because dr Kallie Edward had handled all the problems pt had while she was on chemo.  Pt no longer on chemo and  Has not been for a while so Dr. Mike Gip wanted her to get re-stablished with Dr. Humphrey Rolls.  When I called the office I got her an appt for tom 9:30 and I will fax records to [phone]. Called pt and she is agreeable to the appt and I also let her know that pt has outstanding bill of 73.00 and they will work with her on any payment she can make.  Pt states that she has not been there in 2 years so why does she have a bill and I explained that it was outstanding from previous visits over 3 years ago.  She will make the appt

## 2015-09-26 NOTE — Progress Notes (Signed)
Patient here for follow up no concerns today. 

## 2015-09-27 LAB — CANCER ANTIGEN 27.29: CA 27.29: 11.8 U/mL (ref 0.0–38.6)

## 2015-09-28 DIAGNOSIS — C50911 Malignant neoplasm of unspecified site of right female breast: Secondary | ICD-10-CM | POA: Diagnosis not present

## 2015-09-28 DIAGNOSIS — N390 Urinary tract infection, site not specified: Secondary | ICD-10-CM | POA: Diagnosis not present

## 2015-09-28 DIAGNOSIS — E876 Hypokalemia: Secondary | ICD-10-CM | POA: Diagnosis not present

## 2015-09-28 DIAGNOSIS — E1165 Type 2 diabetes mellitus with hyperglycemia: Secondary | ICD-10-CM | POA: Diagnosis not present

## 2015-11-22 ENCOUNTER — Other Ambulatory Visit: Payer: Self-pay | Admitting: Hematology and Oncology

## 2015-11-23 NOTE — Telephone Encounter (Signed)
I got verbal permission for pt to have a refill from [facility] covering for [employer] while she is on vacation.  I have called the rx into [facility] and left this information with 1refill on voicemail.

## 2015-12-05 ENCOUNTER — Other Ambulatory Visit: Payer: Self-pay | Admitting: Hematology and Oncology

## 2015-12-21 ENCOUNTER — Other Ambulatory Visit: Payer: Self-pay | Admitting: Oncology

## 2015-12-28 DIAGNOSIS — C50911 Malignant neoplasm of unspecified site of right female breast: Secondary | ICD-10-CM | POA: Diagnosis not present

## 2015-12-28 DIAGNOSIS — E1165 Type 2 diabetes mellitus with hyperglycemia: Secondary | ICD-10-CM | POA: Diagnosis not present

## 2015-12-28 DIAGNOSIS — J309 Allergic rhinitis, unspecified: Secondary | ICD-10-CM | POA: Diagnosis not present

## 2015-12-28 DIAGNOSIS — I10 Essential (primary) hypertension: Secondary | ICD-10-CM | POA: Diagnosis not present

## 2016-01-02 DIAGNOSIS — L728 Other follicular cysts of the skin and subcutaneous tissue: Secondary | ICD-10-CM | POA: Diagnosis not present

## 2016-01-04 DIAGNOSIS — C50911 Malignant neoplasm of unspecified site of right female breast: Secondary | ICD-10-CM | POA: Diagnosis not present

## 2016-01-11 DIAGNOSIS — C50911 Malignant neoplasm of unspecified site of right female breast: Secondary | ICD-10-CM | POA: Diagnosis not present

## 2016-01-15 DIAGNOSIS — C50911 Malignant neoplasm of unspecified site of right female breast: Secondary | ICD-10-CM | POA: Diagnosis not present

## 2016-01-18 DIAGNOSIS — Z23 Encounter for immunization: Secondary | ICD-10-CM | POA: Diagnosis not present

## 2016-01-22 ENCOUNTER — Other Ambulatory Visit: Payer: Self-pay | Admitting: Hematology and Oncology

## 2016-01-30 ENCOUNTER — Inpatient Hospital Stay (HOSPITAL_BASED_OUTPATIENT_CLINIC_OR_DEPARTMENT_OTHER): Payer: 59 | Admitting: Hematology and Oncology

## 2016-01-30 ENCOUNTER — Inpatient Hospital Stay: Payer: 59 | Attending: Hematology and Oncology

## 2016-01-30 ENCOUNTER — Other Ambulatory Visit: Payer: Self-pay | Admitting: *Deleted

## 2016-01-30 VITALS — BP 137/82 | HR 80 | Temp 96.7°F | Resp 18 | Wt 155.0 lb

## 2016-01-30 DIAGNOSIS — I10 Essential (primary) hypertension: Secondary | ICD-10-CM | POA: Diagnosis not present

## 2016-01-30 DIAGNOSIS — I2699 Other pulmonary embolism without acute cor pulmonale: Secondary | ICD-10-CM

## 2016-01-30 DIAGNOSIS — Z170 Estrogen receptor positive status [ER+]: Secondary | ICD-10-CM

## 2016-01-30 DIAGNOSIS — C50911 Malignant neoplasm of unspecified site of right female breast: Secondary | ICD-10-CM

## 2016-01-30 DIAGNOSIS — Z7901 Long term (current) use of anticoagulants: Secondary | ICD-10-CM | POA: Insufficient documentation

## 2016-01-30 DIAGNOSIS — Z9221 Personal history of antineoplastic chemotherapy: Secondary | ICD-10-CM | POA: Diagnosis not present

## 2016-01-30 DIAGNOSIS — Z79811 Long term (current) use of aromatase inhibitors: Secondary | ICD-10-CM

## 2016-01-30 DIAGNOSIS — Z923 Personal history of irradiation: Secondary | ICD-10-CM | POA: Diagnosis not present

## 2016-01-30 DIAGNOSIS — M129 Arthropathy, unspecified: Secondary | ICD-10-CM | POA: Insufficient documentation

## 2016-01-30 DIAGNOSIS — Z7984 Long term (current) use of oral hypoglycemic drugs: Secondary | ICD-10-CM | POA: Insufficient documentation

## 2016-01-30 DIAGNOSIS — Z86711 Personal history of pulmonary embolism: Secondary | ICD-10-CM | POA: Diagnosis not present

## 2016-01-30 DIAGNOSIS — Z801 Family history of malignant neoplasm of trachea, bronchus and lung: Secondary | ICD-10-CM | POA: Diagnosis not present

## 2016-01-30 DIAGNOSIS — Z79899 Other long term (current) drug therapy: Secondary | ICD-10-CM | POA: Diagnosis not present

## 2016-01-30 DIAGNOSIS — Z9011 Acquired absence of right breast and nipple: Secondary | ICD-10-CM

## 2016-01-30 DIAGNOSIS — Z87442 Personal history of urinary calculi: Secondary | ICD-10-CM | POA: Diagnosis not present

## 2016-01-30 DIAGNOSIS — K219 Gastro-esophageal reflux disease without esophagitis: Secondary | ICD-10-CM | POA: Insufficient documentation

## 2016-01-30 DIAGNOSIS — Z803 Family history of malignant neoplasm of breast: Secondary | ICD-10-CM | POA: Diagnosis not present

## 2016-01-30 LAB — CBC WITH DIFFERENTIAL/PLATELET
Basophils Absolute: 0 10*3/uL (ref 0–0.1)
Basophils Relative: 0 %
Eosinophils Absolute: 0 10*3/uL (ref 0–0.7)
Eosinophils Relative: 1 %
HCT: 38.5 % (ref 35.0–47.0)
Hemoglobin: 13.5 g/dL (ref 12.0–16.0)
Lymphocytes Relative: 47 %
Lymphs Abs: 1.5 10*3/uL (ref 1.0–3.6)
MCH: 30.2 pg (ref 26.0–34.0)
MCHC: 35.1 g/dL (ref 32.0–36.0)
MCV: 86.2 fL (ref 80.0–100.0)
Monocytes Absolute: 0.2 10*3/uL (ref 0.2–0.9)
Monocytes Relative: 7 %
Neutro Abs: 1.5 10*3/uL (ref 1.4–6.5)
Neutrophils Relative %: 45 %
Platelets: 174 10*3/uL (ref 150–440)
RBC: 4.47 MIL/uL (ref 3.80–5.20)
RDW: 14 % (ref 11.5–14.5)
WBC: 3.3 10*3/uL — ABNORMAL LOW (ref 3.6–11.0)

## 2016-01-30 LAB — COMPREHENSIVE METABOLIC PANEL
ALT: 36 U/L (ref 14–54)
AST: 34 U/L (ref 15–41)
Albumin: 4.1 g/dL (ref 3.5–5.0)
Alkaline Phosphatase: 64 U/L (ref 38–126)
Anion gap: 8 (ref 5–15)
BUN: 13 mg/dL (ref 6–20)
CO2: 26 mmol/L (ref 22–32)
Calcium: 9.2 mg/dL (ref 8.9–10.3)
Chloride: 103 mmol/L (ref 101–111)
Creatinine, Ser: 0.8 mg/dL (ref 0.44–1.00)
GFR calc Af Amer: >60 mL/min (ref >60)
GFR calc non Af Amer: >60 mL/min (ref >60)
Glucose, Bld: 109 mg/dL — ABNORMAL HIGH (ref 65–99)
Potassium: 3.8 mmol/L (ref 3.5–5.1)
Sodium: 137 mmol/L (ref 135–145)
Total Bilirubin: 0.8 mg/dL (ref 0.3–1.2)
Total Protein: 7.7 g/dL (ref 6.5–8.1)

## 2016-01-30 NOTE — Progress Notes (Signed)
[facility] day:  01/30/16   Chief Complaint: Abigail Wheeler is an 65 y.o. female history of stage IIIB right breast cancer who is seen for 4 month assessment on Femara.  HPI: The patient was last seen in the medical oncology clinic on 09/26/2015.  At that time, she denied any complaints.  Potassium was low.  She received a small prescription for oral potassium.  She continued her Femara and Xarelto.  She denied any bruising or bleeding.    During the interim, she has done well. She notes no problems. She states that her Metformin is making her lose weight.  She states that she took her potassium for one week and then as needed.  Her mammogram in December was ordered by Dr. Jamal Collin.  She denies any breast concerns.   Past Medical History:  Diagnosis Date  . Arthritis   . Breast cancer ([place]) 2015   chemo and radiation  . Cancer ([place]) 05-20-13   T1 N1 ER positive/PR negative HER2 negative. right breast  . GERD (gastroesophageal reflux disease)   . Hypertension   . PE (pulmonary embolism)   . Renal calculi    Past Surgical History:  Procedure Laterality Date  . BREAST BIOPSY Right 04-05-13   positive  . BREAST SURGERY Right 05/20/13   mastectomy  . CESAREAN SECTION  1991  . MASTECTOMY Right 2015   chemo and radiation   Family History  Problem Relation Age of Onset  . Lung cancer Father   . Breast cancer Maternal Grandmother 68   Social History:  reports that she has never smoked. She has never used smokeless tobacco. She reports that she does not drink alcohol or use drugs.  The patient lives in [place].  Allergies: No Known Allergies  Current Medications: Current Outpatient Prescriptions  Medication Sig Dispense Refill  . acetaminophen (TYLENOL) 500 MG tablet Take 500 mg by mouth every 6 (six) hours as needed.    . calcium carbonate (OS-CAL) 600 MG TABS tablet Take 600 mg by mouth daily.    . Cholecalciferol (VITAMIN D-3)  1000 UNITS CAPS Take 1 capsule by mouth daily.    . fluticasone (VERAMYST) 27.5 MCG/SPRAY nasal spray Place 1 spray into the nose daily. Reported on 09/21/2015    . letrozole (FEMARA) 2.5 MG tablet Take 2.5 mg by mouth daily.    Marland Kitchen losartan-hydrochlorothiazide (HYZAAR) 100-25 MG tablet Take 1 tablet by mouth daily.    . metFORMIN (GLUCOPHAGE) 500 MG tablet Take by mouth daily.    Marland Kitchen omeprazole (PRILOSEC) 20 MG capsule TAKE 1 CAPSULE BY MOUTH DAILY 90 capsule 2  . potassium chloride SA (K-DUR,KLOR-CON) 20 MEQ tablet Take 1 tablet (20 mEq total) by mouth daily. 3 tablet 0  . XARELTO 20 MG TABS tablet TAKE 1 TABLET BY MOUTH DAILY WITH SUPPER 30 tablet 0   No current facility-administered medications for this visit.    Review of Systems:  GENERAL:  Feels good.  No problems.  No fevers or sweats.  Weight down 16 pounds (felt secondary to Metformin). PERFORMANCE STATUS (ECOG):  0 HEENT:  No visual changes, runny nose, sore throat, mouth sores or tenderness. Lungs: No shortness of breath or cough.  No hemoptysis. Cardiac:  No chest pain, palpitations, orthopnea, or PND. GI:  No nausea, vomiting, diarrhea, constipation, melena or hematochezia. GU:  No urgency, frequency, dysuria, or hematuria. Musculoskeletal:  No back pain.  No joint pain.  No muscle tenderness. Extremities:  No pain or swelling. Skin:  No rashes or skin changes. Neuro:  No headache, numbness or weakness, balance or coordination issues. Endocrine:  No diabetes, thyroid issues, hot flashes or night sweats. Psych:  No mood changes, depression or anxiety. Pain:  No focal pain. Review of systems:  All other systems reviewed and found to be negative.   Physical Exam: Blood pressure 137/82, pulse 80, temperature (!) 96.7 F (35.9 C), temperature source Tympanic, resp. rate 18, weight 154 lb 15.7 oz (70.3 kg). GENERAL:  Well developed, well nourished woman sitting comfortably in the exam room in no acute distress. MENTAL STATUS:  Alert  and oriented to person, place and time. HEAD:  Short styled brown hair.  Normocephalic, atraumatic, face symmetric, no Cushingoid features. EYES:  Glasses.  Brown eyes.  Arcus senilis.  Pupils equal round and reactive to light and accomodation.  No conjunctivitis or scleral icterus. ENT:  Oropharynx clear without lesion.  Torus.  Tongue normal.  Mucous membranes moist.  RESPIRATORY:  Clear to auscultation without rales, wheezes or rhonchi. CARDIOVASCULAR:  Regular rate and rhythm without murmur, rub or gallop. BREAST:  Right sided mastectomy.  No skin changes, erythema or nodularity.  Left nipple inverted.  Fibrocystic changes superiorly and laterally (no change).  No masses, skin changes or nipple discharge. ABDOMEN:  Soft, non-tender, with active bowel sounds, and no hepatosplenomegaly.  No masses. SKIN:  No rashes, ulcers or lesions. EXTREMITIES: No edema, no skin discoloration or tenderness.  No palpable cords. LYMPH NODES: No palpable cervical, supraclavicular, axillary or inguinal adenopathy  NEUROLOGICAL: Unremarkable. PSYCH:  Appropriate.    Appointment on 01/30/2016  Component Date Value Ref Range Status  . WBC 01/30/2016 3.3* 3.6 - 11.0 K/uL Final  . RBC 01/30/2016 4.47  3.80 - 5.20 MIL/uL Final  . Hemoglobin 01/30/2016 13.5  12.0 - 16.0 g/dL Final  . HCT 01/30/2016 38.5  35.0 - 47.0 % Final  . MCV 01/30/2016 86.2  80.0 - 100.0 fL Final  . MCH 01/30/2016 30.2  26.0 - 34.0 pg Final  . MCHC 01/30/2016 35.1  32.0 - 36.0 g/dL Final  . RDW 01/30/2016 14.0  11.5 - 14.5 % Final  . Platelets 01/30/2016 174  150 - 440 K/uL Final  . Neutrophils Relative % 01/30/2016 45  % Final  . Neutro Abs 01/30/2016 1.5  1.4 - 6.5 K/uL Final  . Lymphocytes Relative 01/30/2016 47  % Final  . Lymphs Abs 01/30/2016 1.5  1.0 - 3.6 K/uL Final  . Monocytes Relative 01/30/2016 7  % Final  . Monocytes Absolute 01/30/2016 0.2  0.2 - 0.9 K/uL Final  . Eosinophils Relative 01/30/2016 1  % Final  .  Eosinophils Absolute 01/30/2016 0.0  0 - 0.7 K/uL Final  . Basophils Relative 01/30/2016 0  % Final  . Basophils Absolute 01/30/2016 0.0  0 - 0.1 K/uL Final  . Sodium 01/30/2016 137  135 - 145 mmol/L Final  . Potassium 01/30/2016 3.8  3.5 - 5.1 mmol/L Final  . Chloride 01/30/2016 103  101 - 111 mmol/L Final  . CO2 01/30/2016 26  22 - 32 mmol/L Final  . Glucose, Bld 01/30/2016 109* 65 - 99 mg/dL Final  . BUN 01/30/2016 13  6 - 20 mg/dL Final  . Creatinine, Ser 01/30/2016 0.80  0.44 - 1.00 mg/dL Final  . Calcium 01/30/2016 9.2  8.9 - 10.3 mg/dL Final  . Total Protein 01/30/2016 7.7  6.5 - 8.1 g/dL Final  . Albumin 01/30/2016 4.1  3.5 -  5.0 g/dL Final  . AST 01/30/2016 34  15 - 41 U/L Final  . ALT 01/30/2016 36  14 - 54 U/L Final  . Alkaline Phosphatase 01/30/2016 64  38 - 126 U/L Final  . Total Bilirubin 01/30/2016 0.8  0.3 - 1.2 mg/dL Final  . GFR calc non Af Amer 01/30/2016 >60  >60 mL/min Final  . GFR calc Af Amer 01/30/2016 >60  >60 mL/min Final   Comment: (NOTE) The eGFR has been calculated using the CKD EPI equation. This calculation has not been validated in all clinical situations. eGFR's persistently <60 mL/min signify possible Chronic Kidney Disease.   . Anion gap 01/30/2016 8  5 - 15 Final   Assessment:  SITA MANGEN is a 65 y.o. African American female with stage IIIB multifocal right breast cancer statsus post mastectomy and sentinel lymph node biopsy on 05/19/2013.  Pathology revealed a grade I lobular carcinoma.  The largest focus was 1.7 cm and invaded the pectoralis muscle.  Additional foci were 1.4 cm and 1 mm (two).  Sentinel lymph node revealed a 1.7 mm focus without extracapsular extension.  Tumor was ER positive, PR negative, and HER-2/neu  negative.  She received 4 cycles of Adriamycin and Cytoxan (06/18/2013 - 08/19/2013) followed by 12 weeks of Taxol (09/09/2013 - 11/25/2013).  She received radiation to the chest wall and right axillae from 12/28/2013 -  02/18/2014.  Following completion of radiation, she began Femara.  Left sided mammogram on 04/13/2015 revealed no evidence of malignancy.  CA27.29 was 19 on 01/10/2015 and 13.5 on 05/12/2015.  Bone density study on 03/22/2014 revealed osteopenia with a T score of -1.6 in the right femur neck and -1.3 in L1-L4 spine.  She is on calcium and vitamin D.  She was diagnosed with multiple left lower pulmonary emboli on 08/08/2013.  Hypercoagulable work-up on 01/10/2015 was normal (Factor V Leiden, prothrombin gene mutation, lupus anticoagulant panel, anticardiolipin antibodies, protein C antigen/activity, and protein S antigen/activity).  She is on Xarelto.   Symptomatically, she is doing well on Femara.  Exam is stable.  Weight is down 16 pounds (felt secondary to Metformin per patient).  Potassium is normal.  Plan: 1.  Labs today:  CBC with diff, CMP, CA27.29. 2.  Continue Femara. 3.  Follow-up mammogram scheduled for 04/08/2016. 4.  Ensure patient has a follow-up bone density study (due 03/22/2016). 5.  RTC in 6 months for MD assessment and labs (CBC with diff, CMP, CA27.29).    Lequita Asal, MD  01/30/2016, 11:04 AM

## 2016-01-31 LAB — CANCER ANTIGEN 27.29: CA 27.29: 10.9 U/mL (ref 0.0–38.6)

## 2016-02-07 ENCOUNTER — Other Ambulatory Visit: Payer: Self-pay

## 2016-02-07 DIAGNOSIS — Z1231 Encounter for screening mammogram for malignant neoplasm of breast: Secondary | ICD-10-CM

## 2016-02-11 ENCOUNTER — Encounter: Payer: Self-pay | Admitting: Hematology and Oncology

## 2016-02-12 ENCOUNTER — Other Ambulatory Visit: Payer: Self-pay | Admitting: Hematology and Oncology

## 2016-02-12 DIAGNOSIS — M8580 Other specified disorders of bone density and structure, unspecified site: Secondary | ICD-10-CM

## 2016-02-21 ENCOUNTER — Other Ambulatory Visit: Payer: Self-pay | Admitting: Hematology and Oncology

## 2016-03-20 ENCOUNTER — Other Ambulatory Visit: Payer: Self-pay | Admitting: Hematology and Oncology

## 2016-04-02 DIAGNOSIS — E1165 Type 2 diabetes mellitus with hyperglycemia: Secondary | ICD-10-CM | POA: Diagnosis not present

## 2016-04-02 DIAGNOSIS — C50911 Malignant neoplasm of unspecified site of right female breast: Secondary | ICD-10-CM | POA: Diagnosis not present

## 2016-04-02 DIAGNOSIS — I2699 Other pulmonary embolism without acute cor pulmonale: Secondary | ICD-10-CM | POA: Diagnosis not present

## 2016-04-02 DIAGNOSIS — I10 Essential (primary) hypertension: Secondary | ICD-10-CM | POA: Diagnosis not present

## 2016-04-18 ENCOUNTER — Ambulatory Visit
Admission: RE | Admit: 2016-04-18 | Discharge: 2016-04-18 | Disposition: A | Discharge status: Home / Self Care | Payer: 59 | Source: Ambulatory Visit | Attending: General Surgery | Admitting: General Surgery

## 2016-04-18 ENCOUNTER — Ambulatory Visit: Payer: 59

## 2016-04-18 ENCOUNTER — Other Ambulatory Visit: Payer: Self-pay | Admitting: General Surgery

## 2016-04-18 DIAGNOSIS — Z1231 Encounter for screening mammogram for malignant neoplasm of breast: Secondary | ICD-10-CM | POA: Insufficient documentation

## 2016-04-23 ENCOUNTER — Other Ambulatory Visit: Payer: Self-pay | Admitting: Hematology and Oncology

## 2016-04-24 ENCOUNTER — Ambulatory Visit: Payer: Medicare Other | Admitting: General Surgery

## 2016-04-24 ENCOUNTER — Encounter: Payer: Self-pay | Admitting: *Deleted

## 2016-04-26 ENCOUNTER — Other Ambulatory Visit: Payer: Self-pay | Admitting: Internal Medicine

## 2016-04-26 DIAGNOSIS — I2699 Other pulmonary embolism without acute cor pulmonale: Secondary | ICD-10-CM

## 2016-04-30 ENCOUNTER — Ambulatory Visit (INDEPENDENT_AMBULATORY_CARE_PROVIDER_SITE_OTHER): Payer: Medicare Other | Admitting: General Surgery

## 2016-04-30 ENCOUNTER — Encounter: Payer: Self-pay | Admitting: General Surgery

## 2016-04-30 VITALS — BP 128/74 | HR 76 | Resp 12 | Ht 61.0 in | Wt 157.0 lb

## 2016-04-30 DIAGNOSIS — Z853 Personal history of malignant neoplasm of breast: Secondary | ICD-10-CM

## 2016-04-30 NOTE — Progress Notes (Addendum)
Patient ID: Abigail Wheeler, female   DOB: [birth year], 66 y.o.   MRN: [id number]  Chief Complaint  Patient presents with  . Follow-up    HPI Abigail Wheeler is a 66 y.o. female who presents for a breast cancer follow up.  The most recent mammogram was done on 04/18/2016.  Patient does perform regular self breast checks and gets regular mammograms done. Tolerating Letrazole   I have reviewed the history of present illness with the patient.  HPI  Past Medical History:  Diagnosis Date  . Arthritis   . Breast cancer ([place]) 2015   chemo and radiation  . Cancer ([place]) 05-20-13   T1 N1 ER positive/PR negative HER2 negative. right breast  . GERD (gastroesophageal reflux disease)   . Hypertension   . PE (pulmonary embolism)   . Renal calculi     Past Surgical History:  Procedure Laterality Date  . BREAST BIOPSY Right 04-05-13   positive  . BREAST SURGERY Right 05/20/13   mastectomy  . CESAREAN SECTION  1991  . MASTECTOMY Right 2015   chemo and radiation    Family History  Problem Relation Age of Onset  . Lung cancer Father   . Breast cancer Maternal Grandmother 68    Social History Social History  Substance Use Topics  . Smoking status: Never Smoker  . Smokeless tobacco: Never Used  . Alcohol use No    No Known Allergies  Current Outpatient Prescriptions  Medication Sig Dispense Refill  . acetaminophen (TYLENOL) 500 MG tablet Take 500 mg by mouth every 6 (six) hours as needed.    . calcium carbonate (OS-CAL) 600 MG TABS tablet Take 600 mg by mouth daily.    . Cholecalciferol (VITAMIN D-3) 1000 UNITS CAPS Take 1 capsule by mouth daily.    Marland Kitchen letrozole (FEMARA) 2.5 MG tablet Take 2.5 mg by mouth daily.    Marland Kitchen losartan-hydrochlorothiazide (HYZAAR) 100-25 MG tablet Take 1 tablet by mouth daily.    . metFORMIN (GLUCOPHAGE) 500 MG tablet Take by mouth daily.    Marland Kitchen omeprazole (PRILOSEC) 20 MG capsule TAKE 1 CAPSULE BY MOUTH DAILY 90 capsule 2  . potassium chloride SA  (K-DUR,KLOR-CON) 20 MEQ tablet Take 1 tablet (20 mEq total) by mouth daily. (Patient taking differently: Take 20 mEq by mouth as needed. ) 3 tablet 0  . XARELTO 20 MG TABS tablet TAKE 1 TABLET BY MOUTH DAILY WITH SUPPER 30 tablet 0  . XARELTO 20 MG TABS tablet TAKE 1 TABLET BY MOUTH DAILY WITH SUPPER 30 tablet 0   No current facility-administered medications for this visit.     Review of Systems Review of Systems  Constitutional: Negative.   Respiratory: Negative.   Cardiovascular: Negative.     Blood pressure 128/74, pulse 76, resp. rate 12, height '5\' 1"'$  (1.549 m), weight 157 lb (71.2 kg).  Physical Exam Physical Exam  Constitutional: She is oriented to person, place, and time. She appears well-developed and well-nourished.  Eyes: Conjunctivae are normal. No scleral icterus.  Neck: Neck supple.  Cardiovascular: Normal rate, regular rhythm and normal heart sounds.   Pulmonary/Chest: Effort normal and breath sounds normal. Left breast exhibits inverted nipple. Left breast exhibits no mass, no skin change and no tenderness.  Right mastectomy site well healed, no sign of local recurrence  Abdominal: Soft. Bowel sounds are normal. There is no tenderness.  Lymphadenopathy:    She has no cervical adenopathy.    She has no axillary adenopathy.  Neurological: She is  alert and oriented to person, place, and time.  Skin: Skin is warm.    Data Reviewed Mammogram left reviewed and stable.  Assessment         She is 3 yrs post right  Mastectomy and SN biopsy for T1,N1 CA with focal muscle involvement. Has completed chemo and radiation. Currently on Letrazole and doing well.     Plan      The patient has been asked to return to the office in one year with a left  diagnostic mammogram in December 2018. The patient is aware to call back for any questions or concerns    This information has been scribed by Gaspar Cola CMA.   Jenefer Woerner G 05/15/2016, 6:50 PM

## 2016-04-30 NOTE — Patient Instructions (Addendum)
   The patient has been asked to return to the office in one year with a left  diagnostic mammogram in December 2018. The patient is aware to call back for any questions or concerns

## 2016-05-08 ENCOUNTER — Ambulatory Visit: Payer: 59

## 2016-05-16 IMAGING — MG MM SCREENING BREAST TOMO UNI L
6 series · 6 of 14 positions shown · non-contrast
Comparison: Previous exam(s).

CLINICAL DATA: Screening. History of right breast cancer in 6237
status post mastectomy.

EXAM:
DIGITAL SCREENING UNILATERAL LEFT MAMMOGRAM WITH TOMO AND CAD

[L MLO]
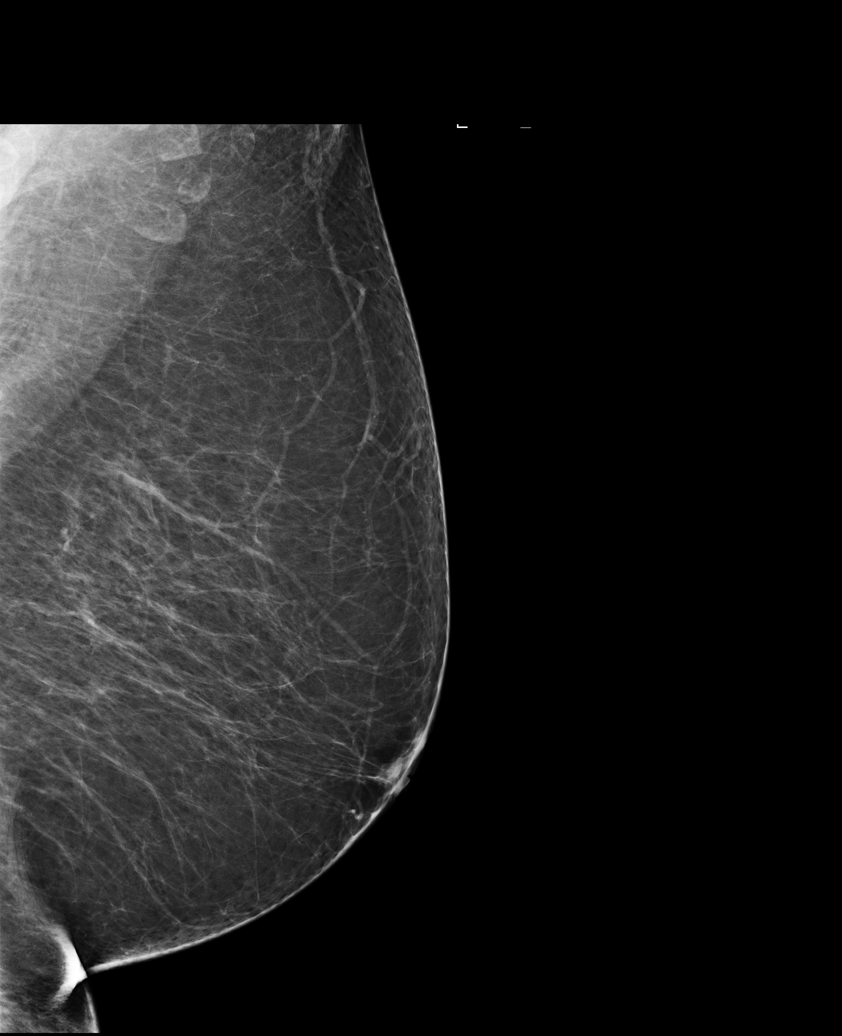

[L CC synth-2D]
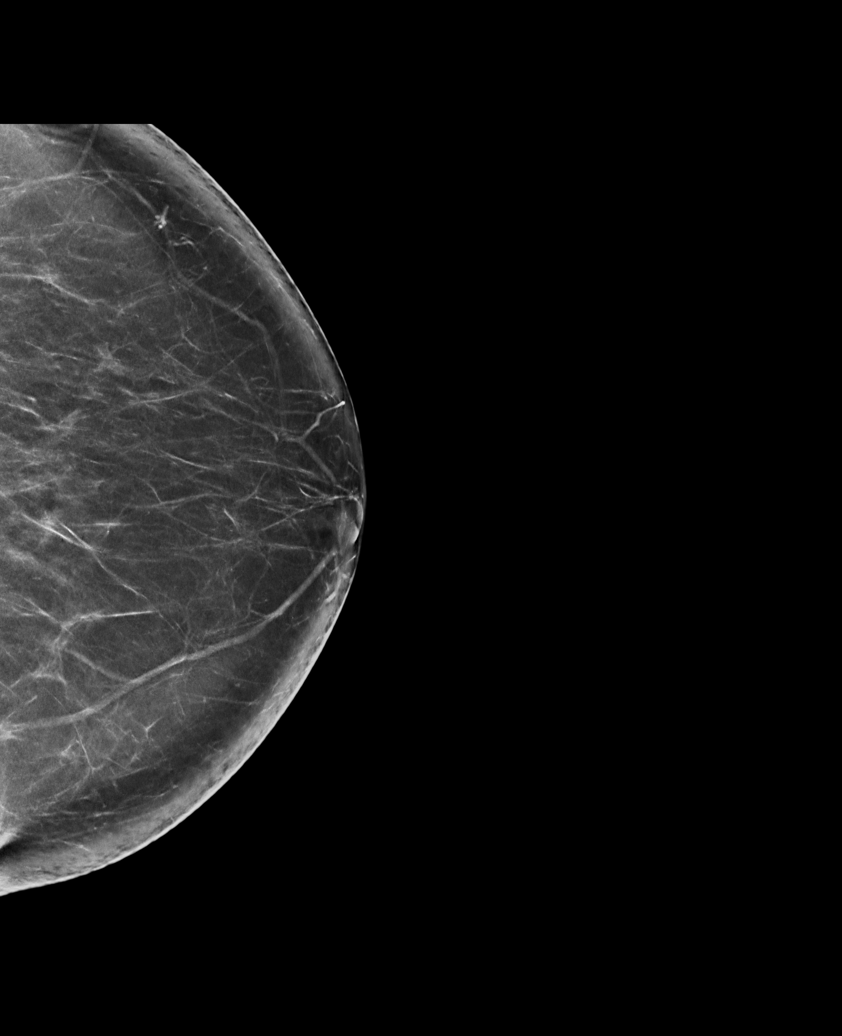

[L MLO synth-2D]
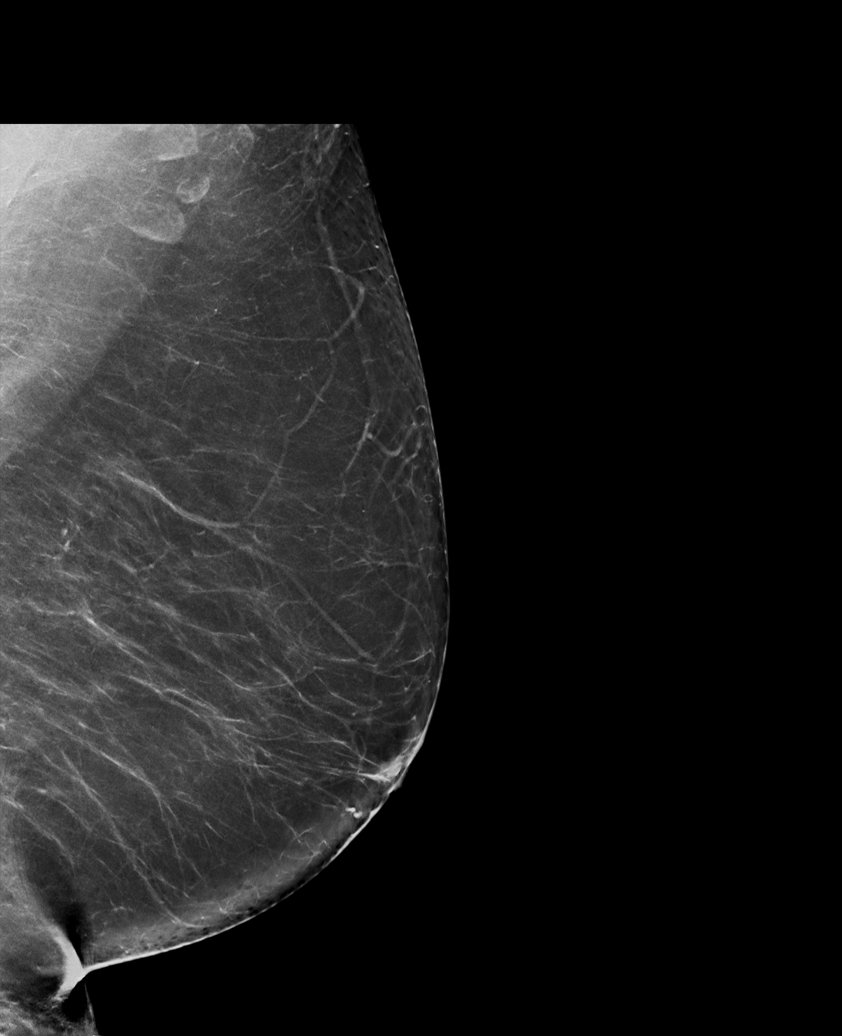

[L CC]
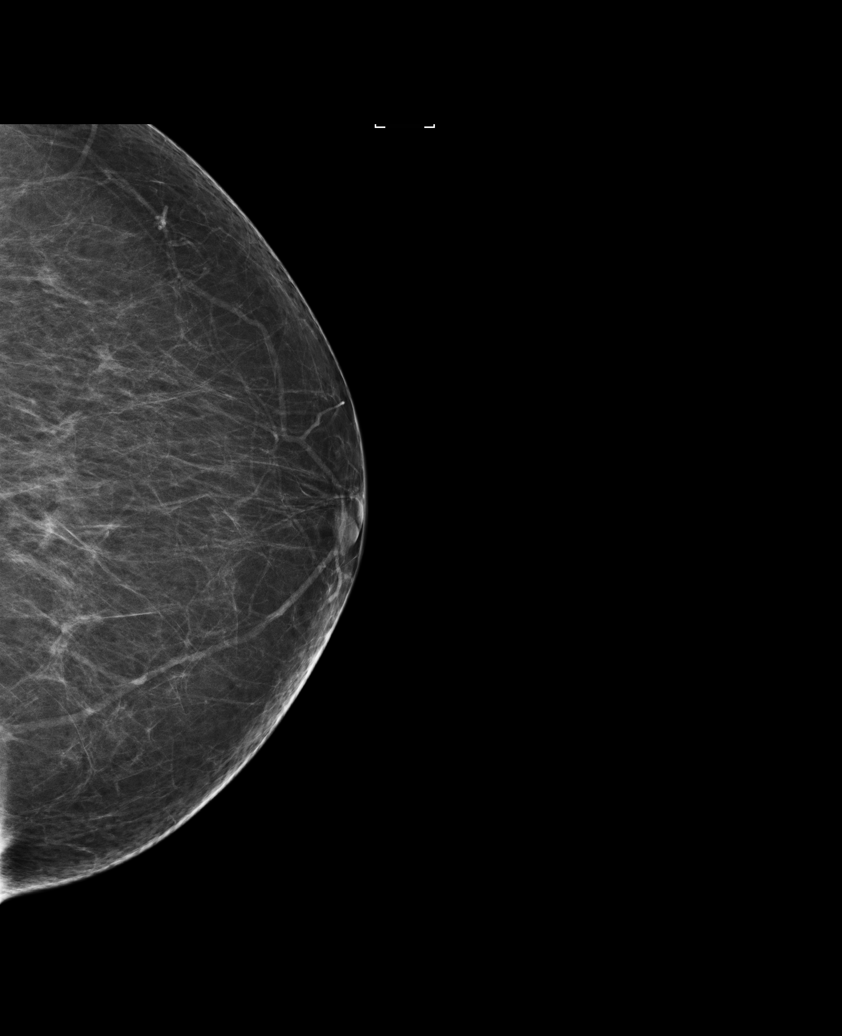

[L CC tomo · tomo slice 45/90.0]
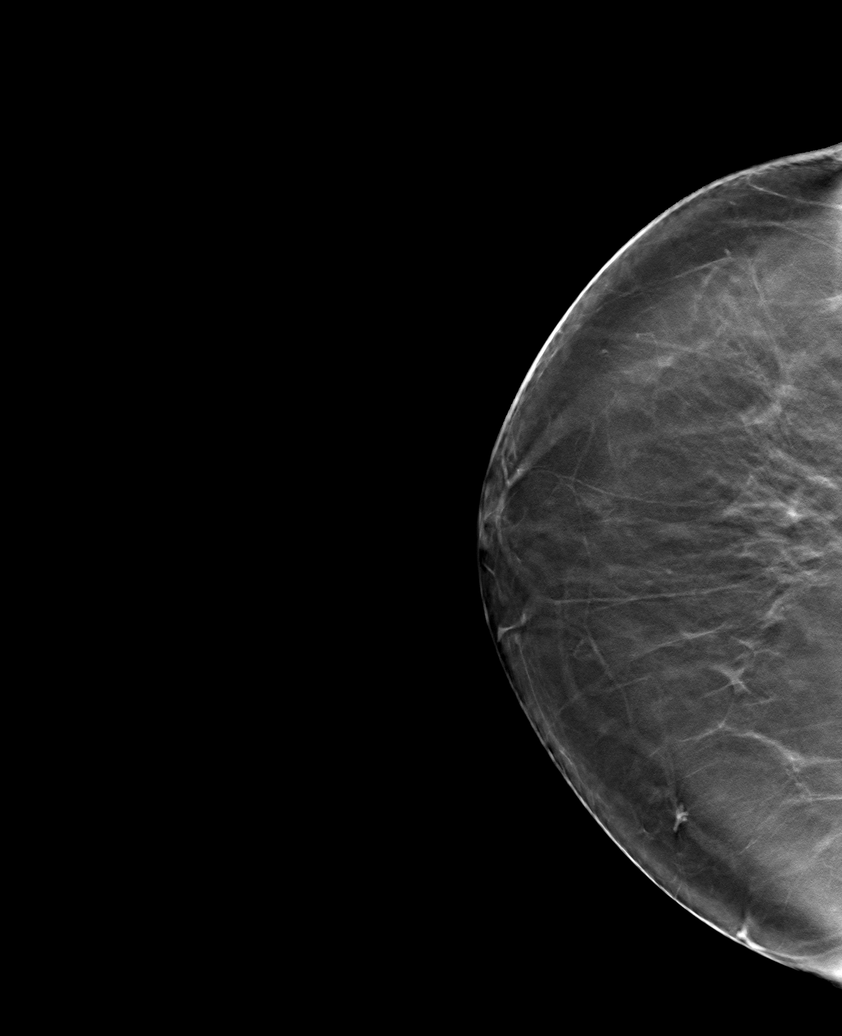

[L MLO tomo · tomo slice 49/97.0]
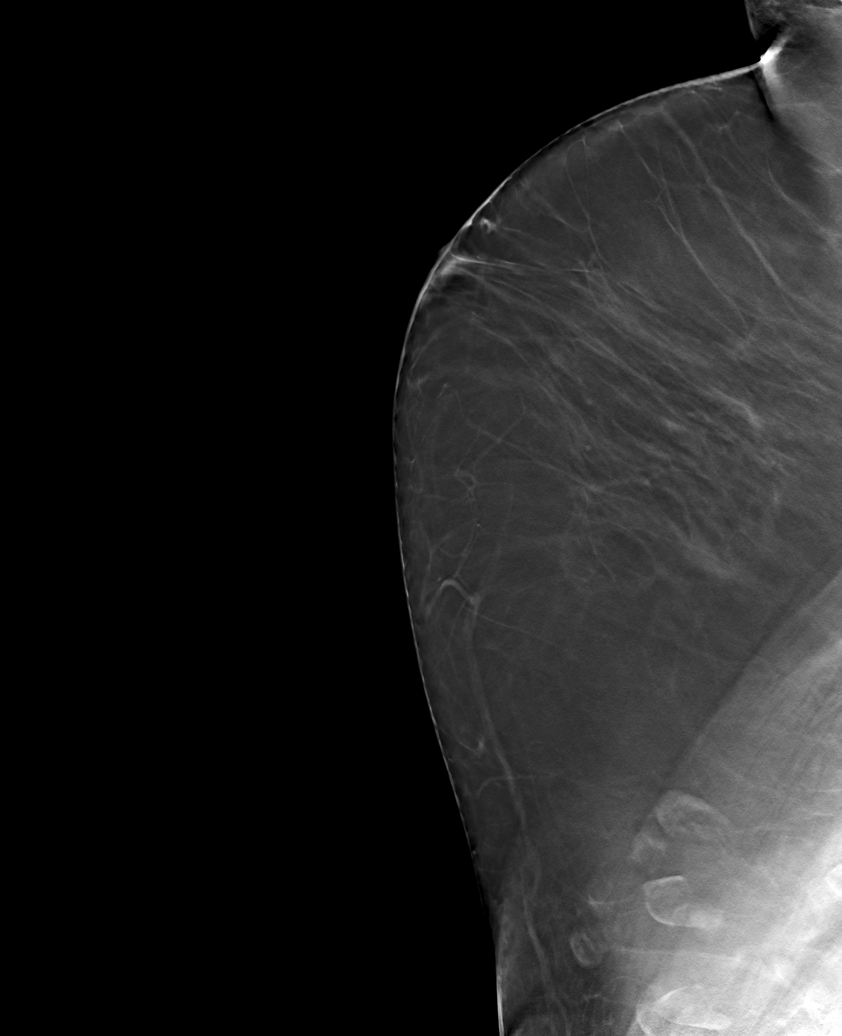

[6 of 14 positions shown; findings below may reference images not displayed]

ACR Breast Density Category b: There are scattered areas of
fibroglandular density.
FINDINGS: There are no findings suspicious for malignancy within the left
breast. Patient is status post right mastectomy. Images were
processed with CAD.
IMPRESSION: No mammographic evidence of malignancy. A result letter of this
screening mammogram will be mailed directly to the patient.

RECOMMENDATION:
Screening mammogram in one year. (Code:[id number]-[id number])

BI-RADS CATEGORY  1: Negative.

## 2016-05-21 ENCOUNTER — Ambulatory Visit
Admission: RE | Admit: 2016-05-21 | Discharge: 2016-05-21 | Disposition: A | Discharge status: Home / Self Care | Payer: 59 | Source: Ambulatory Visit | Attending: Internal Medicine | Admitting: Internal Medicine

## 2016-05-21 DIAGNOSIS — I2699 Other pulmonary embolism without acute cor pulmonale: Secondary | ICD-10-CM | POA: Diagnosis present

## 2016-05-21 DIAGNOSIS — K449 Diaphragmatic hernia without obstruction or gangrene: Secondary | ICD-10-CM | POA: Diagnosis not present

## 2016-05-21 DIAGNOSIS — Z86711 Personal history of pulmonary embolism: Secondary | ICD-10-CM | POA: Insufficient documentation

## 2016-05-21 HISTORY — DX: Type 2 diabetes mellitus without complications: E11.9

## 2016-05-21 LAB — POCT I-STAT CREATININE: CREATININE: 0.9 mg/dL (ref 0.44–1.00)

## 2016-05-21 MED ORDER — IOPAMIDOL (ISOVUE-370) INJECTION 76%
75.0000 mL | Freq: Once | INTRAVENOUS | Status: AC | PRN
  Administered 2016-05-21: 75 mL via INTRAVENOUS

## 2016-05-23 ENCOUNTER — Other Ambulatory Visit: Payer: Self-pay | Admitting: Hematology and Oncology

## 2016-05-23 NOTE — Telephone Encounter (Signed)
Per VO Dr Mike Gip, discontinue Xarelto as work up was [employer]. Patient informed and advised per Dr Mike Gip that if she ever develops another clot, she will be on Xarelto for lifetime

## 2016-06-14 ENCOUNTER — Encounter: Payer: Self-pay | Admitting: *Deleted

## 2016-07-02 DIAGNOSIS — I10 Essential (primary) hypertension: Secondary | ICD-10-CM | POA: Diagnosis not present

## 2016-07-02 DIAGNOSIS — E119 Type 2 diabetes mellitus without complications: Secondary | ICD-10-CM | POA: Diagnosis not present

## 2016-07-02 DIAGNOSIS — R0600 Dyspnea, unspecified: Secondary | ICD-10-CM | POA: Diagnosis not present

## 2016-07-02 DIAGNOSIS — I499 Cardiac arrhythmia, unspecified: Secondary | ICD-10-CM | POA: Diagnosis not present

## 2016-07-02 DIAGNOSIS — I2699 Other pulmonary embolism without acute cor pulmonale: Secondary | ICD-10-CM | POA: Diagnosis not present

## 2016-07-02 DIAGNOSIS — E876 Hypokalemia: Secondary | ICD-10-CM | POA: Diagnosis not present

## 2016-07-02 DIAGNOSIS — C50911 Malignant neoplasm of unspecified site of right female breast: Secondary | ICD-10-CM | POA: Diagnosis not present

## 2016-07-02 DIAGNOSIS — J029 Acute pharyngitis, unspecified: Secondary | ICD-10-CM | POA: Diagnosis not present

## 2016-07-30 ENCOUNTER — Other Ambulatory Visit: Payer: 59

## 2016-07-30 ENCOUNTER — Ambulatory Visit: Payer: 59 | Admitting: Hematology and Oncology

## 2016-08-07 DIAGNOSIS — I499 Cardiac arrhythmia, unspecified: Secondary | ICD-10-CM | POA: Diagnosis not present

## 2016-08-07 DIAGNOSIS — R0600 Dyspnea, unspecified: Secondary | ICD-10-CM | POA: Diagnosis not present

## 2016-08-13 ENCOUNTER — Inpatient Hospital Stay: Payer: 59 | Admitting: Hematology and Oncology

## 2016-08-13 ENCOUNTER — Inpatient Hospital Stay: Payer: 59

## 2016-08-20 ENCOUNTER — Inpatient Hospital Stay (HOSPITAL_BASED_OUTPATIENT_CLINIC_OR_DEPARTMENT_OTHER): Payer: 59 | Admitting: Hematology and Oncology

## 2016-08-20 ENCOUNTER — Inpatient Hospital Stay: Payer: 59 | Attending: Hematology and Oncology

## 2016-08-20 VITALS — BP 127/81 | HR 94 | Temp 98.5°F | Resp 18 | Wt 150.1 lb

## 2016-08-20 DIAGNOSIS — Z170 Estrogen receptor positive status [ER+]: Secondary | ICD-10-CM | POA: Insufficient documentation

## 2016-08-20 DIAGNOSIS — Z9221 Personal history of antineoplastic chemotherapy: Secondary | ICD-10-CM | POA: Diagnosis not present

## 2016-08-20 DIAGNOSIS — Z7901 Long term (current) use of anticoagulants: Secondary | ICD-10-CM | POA: Insufficient documentation

## 2016-08-20 DIAGNOSIS — C50211 Malignant neoplasm of upper-inner quadrant of right female breast: Secondary | ICD-10-CM | POA: Insufficient documentation

## 2016-08-20 DIAGNOSIS — Z87442 Personal history of urinary calculi: Secondary | ICD-10-CM | POA: Diagnosis not present

## 2016-08-20 DIAGNOSIS — Z79811 Long term (current) use of aromatase inhibitors: Secondary | ICD-10-CM

## 2016-08-20 DIAGNOSIS — Z923 Personal history of irradiation: Secondary | ICD-10-CM | POA: Insufficient documentation

## 2016-08-20 DIAGNOSIS — Z79899 Other long term (current) drug therapy: Secondary | ICD-10-CM

## 2016-08-20 DIAGNOSIS — E119 Type 2 diabetes mellitus without complications: Secondary | ICD-10-CM | POA: Insufficient documentation

## 2016-08-20 DIAGNOSIS — C50911 Malignant neoplasm of unspecified site of right female breast: Secondary | ICD-10-CM

## 2016-08-20 DIAGNOSIS — Z86711 Personal history of pulmonary embolism: Secondary | ICD-10-CM | POA: Insufficient documentation

## 2016-08-20 DIAGNOSIS — E876 Hypokalemia: Secondary | ICD-10-CM | POA: Diagnosis not present

## 2016-08-20 DIAGNOSIS — K219 Gastro-esophageal reflux disease without esophagitis: Secondary | ICD-10-CM

## 2016-08-20 DIAGNOSIS — I2699 Other pulmonary embolism without acute cor pulmonale: Secondary | ICD-10-CM

## 2016-08-20 DIAGNOSIS — I10 Essential (primary) hypertension: Secondary | ICD-10-CM | POA: Insufficient documentation

## 2016-08-20 DIAGNOSIS — Z9011 Acquired absence of right breast and nipple: Secondary | ICD-10-CM | POA: Insufficient documentation

## 2016-08-20 DIAGNOSIS — M129 Arthropathy, unspecified: Secondary | ICD-10-CM | POA: Insufficient documentation

## 2016-08-20 DIAGNOSIS — Z7984 Long term (current) use of oral hypoglycemic drugs: Secondary | ICD-10-CM

## 2016-08-20 LAB — CBC WITH DIFFERENTIAL/PLATELET
Basophils Absolute: 0 10*3/uL (ref 0–0.1)
Basophils Relative: 1 %
Eosinophils Absolute: 0 10*3/uL (ref 0–0.7)
Eosinophils Relative: 1 %
HCT: 38.2 % (ref 35.0–47.0)
Hemoglobin: 13.3 g/dL (ref 12.0–16.0)
Lymphocytes Relative: 48 %
Lymphs Abs: 1.4 10*3/uL (ref 1.0–3.6)
MCH: 30 pg (ref 26.0–34.0)
MCHC: 34.8 g/dL (ref 32.0–36.0)
MCV: 86.4 fL (ref 80.0–100.0)
Monocytes Absolute: 0.3 10*3/uL (ref 0.2–0.9)
Monocytes Relative: 9 %
Neutro Abs: 1.2 10*3/uL — ABNORMAL LOW (ref 1.4–6.5)
Neutrophils Relative %: 41 %
Platelets: 200 10*3/uL (ref 150–440)
RBC: 4.42 MIL/uL (ref 3.80–5.20)
RDW: 14.5 % (ref 11.5–14.5)
WBC: 3 10*3/uL — ABNORMAL LOW (ref 3.6–11.0)

## 2016-08-20 LAB — COMPREHENSIVE METABOLIC PANEL
ALT: 26 U/L (ref 14–54)
AST: 23 U/L (ref 15–41)
Albumin: 3.7 g/dL (ref 3.5–5.0)
Alkaline Phosphatase: 62 U/L (ref 38–126)
Anion gap: 4 — ABNORMAL LOW (ref 5–15)
BUN: 11 mg/dL (ref 6–20)
CO2: 29 mmol/L (ref 22–32)
Calcium: 9.7 mg/dL (ref 8.9–10.3)
Chloride: 106 mmol/L (ref 101–111)
Creatinine, Ser: 0.79 mg/dL (ref 0.44–1.00)
GFR calc Af Amer: >60 mL/min (ref >60)
GFR calc non Af Amer: >60 mL/min (ref >60)
Glucose, Bld: 135 mg/dL — ABNORMAL HIGH (ref 65–99)
Potassium: 3.4 mmol/L — ABNORMAL LOW (ref 3.5–5.1)
Sodium: 139 mmol/L (ref 135–145)
Total Bilirubin: 0.7 mg/dL (ref 0.3–1.2)
Total Protein: 7.5 g/dL (ref 6.5–8.1)

## 2016-08-20 NOTE — Progress Notes (Signed)
[facility] day:  08/20/16   Chief Complaint: Abigail Wheeler is an 66 y.o. female history of stage IIIB right breast cancer who is seen for 6 month assessment on Femara.  HPI: The patient was last seen in the medical oncology clinic on 01/30/2016.  At that time, she was doing well on Femara.  Exam was stable.  Weight was down 16 pounds (felt secondary to Metformin per patient).  Unilateral left screening mammogram on 04/18/2016 revealed no evidence of malignancy.  Bone density study was ordered, but not performed.  Chest CT angiogram on 05/21/2016 revealed no evidence of pulmonary embolism.  Symptomatically, she denies any complaints. She denies any breast concerns.  She states that she is eating well.   Past Medical History:  Diagnosis Date  . Arthritis   . Breast cancer ([place]) 2015   chemo and radiation  . Cancer ([place]) 05-20-13   T1 N1 ER positive/PR negative HER2 negative. right breast  . Diabetes mellitus without complication ([place])   . GERD (gastroesophageal reflux disease)   . Hypertension   . PE (pulmonary embolism)   . Renal calculi    Past Surgical History:  Procedure Laterality Date  . BREAST BIOPSY Right 04-05-13   positive  . BREAST SURGERY Right 05/20/13   mastectomy  . CESAREAN SECTION  1991  . MASTECTOMY Right 2015   chemo and radiation   Family History  Problem Relation Age of Onset  . Lung cancer Father   . Breast cancer Maternal Grandmother 68   Social History:  reports that she has never smoked. She has never used smokeless tobacco. She reports that she does not drink alcohol or use drugs.  The patient lives in [place].  She is accompanied by her husband, Richard.  Allergies: No Known Allergies  Current Medications: Current Outpatient Prescriptions  Medication Sig Dispense Refill  . acetaminophen (TYLENOL) 500 MG tablet Take 500 mg by mouth every 6 (six) hours as needed.    . calcium carbonate (OS-CAL)  600 MG TABS tablet Take 600 mg by mouth daily.    . Cholecalciferol (VITAMIN D-3) 1000 UNITS CAPS Take 1 capsule by mouth daily.    Marland Kitchen letrozole (FEMARA) 2.5 MG tablet Take 2.5 mg by mouth daily.    Marland Kitchen losartan-hydrochlorothiazide (HYZAAR) 100-25 MG tablet Take 1 tablet by mouth daily.    . metFORMIN (GLUCOPHAGE) 500 MG tablet Take by mouth daily.    Marland Kitchen omeprazole (PRILOSEC) 20 MG capsule TAKE 1 CAPSULE BY MOUTH DAILY 90 capsule 2  . potassium chloride SA (K-DUR,KLOR-CON) 20 MEQ tablet Take 1 tablet (20 mEq total) by mouth daily. (Patient taking differently: Take 20 mEq by mouth as needed. ) 3 tablet 0  . XARELTO 20 MG TABS tablet TAKE 1 TABLET BY MOUTH DAILY WITH SUPPER (Patient not taking: Reported on 08/20/2016) 30 tablet 0  . XARELTO 20 MG TABS tablet TAKE 1 TABLET BY MOUTH DAILY WITH SUPPER (Patient not taking: Reported on 08/20/2016) 30 tablet 0   No current facility-administered medications for this visit.    Review of Systems:  GENERAL:  Feels good.  No problems.  No fevers or sweats.  Weight down 4 pounds. PERFORMANCE STATUS (ECOG):  0 HEENT:  No visual changes, runny nose, sore throat, mouth sores or tenderness. Lungs: No shortness of breath or cough.  No hemoptysis. Cardiac:  No chest pain, palpitations, orthopnea, or PND. GI:  No nausea, vomiting, diarrhea, constipation, melena or hematochezia. GU:  No urgency, frequency, dysuria, or hematuria. Musculoskeletal:  No back pain.  No joint pain.  No muscle tenderness. Extremities:  No pain or swelling. Skin:  No rashes or skin changes. Neuro:  No headache, numbness or weakness, balance or coordination issues. Endocrine:  No diabetes, thyroid issues, hot flashes or night sweats. Psych:  No mood changes, depression or anxiety. Pain:  No focal pain. Review of systems:  All other systems reviewed and found to be negative.   Physical Exam: Blood pressure 127/81, pulse 94, temperature 98.5 F (36.9 C), temperature source Tympanic, resp.  rate 18, weight 150 lb 2 oz (68.1 kg). GENERAL:  Well developed, well nourished woman sitting comfortably in the exam room in no acute distress. MENTAL STATUS:  Alert and oriented to person, place and time. HEAD:  Short styled brown hair.  Normocephalic, atraumatic, face symmetric, no Cushingoid features. EYES:  Glasses.  Brown eyes.  Arcus senilis.  Pupils equal round and reactive to light and accomodation.  No conjunctivitis or scleral icterus. ENT:  Oropharynx clear without lesion.  Torus.  Tongue normal.  Mucous membranes moist.  RESPIRATORY:  Clear to auscultation without rales, wheezes or rhonchi. CARDIOVASCULAR:  Regular rate and rhythm without murmur, rub or gallop. BREAST:  Right sided mastectomy.  No skin changes, erythema or nodularity.  Left nipple inverted.  Fibrocystic changes superiorly and laterally.  No masses, skin changes or nipple discharge. ABDOMEN:  Soft, non-tender, with active bowel sounds, and no hepatosplenomegaly.  No masses. SKIN:  No rashes, ulcers or lesions. EXTREMITIES: No edema, no skin discoloration or tenderness.  No palpable cords. LYMPH NODES: No palpable cervical, supraclavicular, axillary or inguinal adenopathy  NEUROLOGICAL: Unremarkable. PSYCH:  Appropriate.    Appointment on 08/20/2016  Component Date Value Ref Range Status  . WBC 08/20/2016 3.0* 3.6 - 11.0 K/uL Final  . RBC 08/20/2016 4.42  3.80 - 5.20 MIL/uL Final  . Hemoglobin 08/20/2016 13.3  12.0 - 16.0 g/dL Final  . HCT 08/20/2016 38.2  35.0 - 47.0 % Final  . MCV 08/20/2016 86.4  80.0 - 100.0 fL Final  . MCH 08/20/2016 30.0  26.0 - 34.0 pg Final  . MCHC 08/20/2016 34.8  32.0 - 36.0 g/dL Final  . RDW 08/20/2016 14.5  11.5 - 14.5 % Final  . Platelets 08/20/2016 200  150 - 440 K/uL Final  . Neutrophils Relative % 08/20/2016 41  % Final  . Neutro Abs 08/20/2016 1.2* 1.4 - 6.5 K/uL Final  . Lymphocytes Relative 08/20/2016 48  % Final  . Lymphs Abs 08/20/2016 1.4  1.0 - 3.6 K/uL Final  .  Monocytes Relative 08/20/2016 9  % Final  . Monocytes Absolute 08/20/2016 0.3  0.2 - 0.9 K/uL Final  . Eosinophils Relative 08/20/2016 1  % Final  . Eosinophils Absolute 08/20/2016 0.0  0 - 0.7 K/uL Final  . Basophils Relative 08/20/2016 1  % Final  . Basophils Absolute 08/20/2016 0.0  0 - 0.1 K/uL Final  . Sodium 08/20/2016 139  135 - 145 mmol/L Final  . Potassium 08/20/2016 3.4* 3.5 - 5.1 mmol/L Final  . Chloride 08/20/2016 106  101 - 111 mmol/L Final  . CO2 08/20/2016 29  22 - 32 mmol/L Final  . Glucose, Bld 08/20/2016 135* 65 - 99 mg/dL Final  . BUN 08/20/2016 11  6 - 20 mg/dL Final  . Creatinine, Ser 08/20/2016 0.79  0.44 - 1.00 mg/dL Final  . Calcium 08/20/2016 9.7  8.9 - 10.3 mg/dL Final  . Total Protein  08/20/2016 7.5  6.5 - 8.1 g/dL Final  . Albumin 08/20/2016 3.7  3.5 - 5.0 g/dL Final  . AST 08/20/2016 23  15 - 41 U/L Final  . ALT 08/20/2016 26  14 - 54 U/L Final  . Alkaline Phosphatase 08/20/2016 62  38 - 126 U/L Final  . Total Bilirubin 08/20/2016 0.7  0.3 - 1.2 mg/dL Final  . GFR calc non Af Amer 08/20/2016 >60  >60 mL/min Final  . GFR calc Af Amer 08/20/2016 >60  >60 mL/min Final   Comment: (NOTE) The eGFR has been calculated using the CKD EPI equation. This calculation has not been validated in all clinical situations. eGFR's persistently <60 mL/min signify possible Chronic Kidney Disease.   . Anion gap 08/20/2016 4* 5 - 15 Final   Assessment:  Abigail Wheeler is a 66 y.o. African American female with stage IIIB multifocal right breast cancer statsus post mastectomy and sentinel lymph node biopsy on 05/19/2013.  Pathology revealed a grade I lobular carcinoma.  The largest focus was 1.7 cm and invaded the pectoralis muscle.  Additional foci were 1.4 cm and 1 mm (two).  Sentinel lymph node revealed a 1.7 mm focus without extracapsular extension.  Tumor was ER positive, PR negative, and HER-2/neu  negative.  She received 4 cycles of Adriamycin and Cytoxan (06/18/2013 -  08/19/2013) followed by 12 weeks of Taxol (09/09/2013 - 11/25/2013).  She received radiation to the chest wall and right axillae from 12/28/2013 - 02/18/2014.  Following completion of radiation, she began Femara.  Left sided mammogram on 04/18/2016 revealed no evidence of malignancy.    CA27.29 has been followed: 11.7 on 05/12/2013, 3.8 on 12/30/2013, 23.6 on 09/09/2014, 19 on 01/10/2015, 13.5 on 05/12/2015, 11.8 on 09/26/2015, 10.9 on 01/30/2016, and 11.7 on 08/20/2016.  Bone density study on 03/22/2014 revealed osteopenia with a T score of -1.6 in the right femur neck and -1.3 in L1-L4 spine.  She is on calcium and vitamin D.  She was diagnosed with multiple left lower pulmonary emboli on 08/08/2013.  Hypercoagulable work-up on 01/10/2015 was normal (Factor V Leiden, prothrombin gene mutation, lupus anticoagulant panel, anticardiolipin antibodies, protein C antigen/activity, and protein S antigen/activity).  Chest CT angiogram on 05/21/2016 revealed no evidence of pulmonary embolism.  She is on Xarelto.   Symptomatically, she is doing well on Femara.  Exam is stable.  She has mild hypokalemia (3.4).  Plan: 1.  Labs today:  CBC with diff, CMP, CA27.29. 2.  Review interval mammogram- no evidence of malignancy. 3.  Continue Femara. 4.  Reschedule bone density study. 5.  RTC in 1 month for CBC with diff 6.  RTC in 6 months for MD assessment and labs (CBC with diff, CMP, CA27.29).    Lequita Asal, MD  08/20/2016, 12:13 PM

## 2016-08-20 NOTE — Progress Notes (Signed)
Patient offers no concerns today. 

## 2016-08-21 LAB — CANCER ANTIGEN 27.29: CA 27.29: 11.7 U/mL (ref 0.0–38.6)

## 2016-08-27 ENCOUNTER — Ambulatory Visit
Admission: RE | Admit: 2016-08-27 | Discharge: 2016-08-27 | Disposition: A | Discharge status: Home / Self Care | Payer: 59 | Source: Ambulatory Visit | Attending: Hematology and Oncology | Admitting: Hematology and Oncology

## 2016-08-27 DIAGNOSIS — M8580 Other specified disorders of bone density and structure, unspecified site: Secondary | ICD-10-CM

## 2016-08-27 DIAGNOSIS — M85852 Other specified disorders of bone density and structure, left thigh: Secondary | ICD-10-CM | POA: Diagnosis not present

## 2016-08-27 DIAGNOSIS — M8588 Other specified disorders of bone density and structure, other site: Secondary | ICD-10-CM | POA: Insufficient documentation

## 2016-09-01 ENCOUNTER — Encounter: Payer: Self-pay | Admitting: Hematology and Oncology

## 2016-09-24 ENCOUNTER — Ambulatory Visit
Admission: RE | Admit: 2016-09-24 | Discharge: 2016-09-24 | Disposition: A | Discharge status: Home / Self Care | Payer: 59 | Source: Ambulatory Visit | Attending: Radiation Oncology | Admitting: Radiation Oncology

## 2016-09-24 ENCOUNTER — Inpatient Hospital Stay: Payer: 59 | Attending: Hematology and Oncology

## 2016-09-24 ENCOUNTER — Encounter: Payer: Self-pay | Admitting: Radiation Oncology

## 2016-09-24 VITALS — BP 159/80 | HR 81 | Temp 96.9°F | Resp 18 | Wt 149.6 lb

## 2016-09-24 DIAGNOSIS — Z170 Estrogen receptor positive status [ER+]: Secondary | ICD-10-CM | POA: Diagnosis not present

## 2016-09-24 DIAGNOSIS — Z79811 Long term (current) use of aromatase inhibitors: Secondary | ICD-10-CM | POA: Insufficient documentation

## 2016-09-24 DIAGNOSIS — C50911 Malignant neoplasm of unspecified site of right female breast: Secondary | ICD-10-CM

## 2016-09-24 DIAGNOSIS — Z9011 Acquired absence of right breast and nipple: Secondary | ICD-10-CM | POA: Insufficient documentation

## 2016-09-24 DIAGNOSIS — C50211 Malignant neoplasm of upper-inner quadrant of right female breast: Secondary | ICD-10-CM | POA: Insufficient documentation

## 2016-09-24 LAB — CBC WITH DIFFERENTIAL/PLATELET
Basophils Absolute: 0 10*3/uL (ref 0–0.1)
Basophils Relative: 1 %
Eosinophils Absolute: 0 10*3/uL (ref 0–0.7)
Eosinophils Relative: 1 %
HCT: 37.7 % (ref 35.0–47.0)
Hemoglobin: 13.2 g/dL (ref 12.0–16.0)
Lymphocytes Relative: 42 %
Lymphs Abs: 1.5 10*3/uL (ref 1.0–3.6)
MCH: 30.3 pg (ref 26.0–34.0)
MCHC: 34.9 g/dL (ref 32.0–36.0)
MCV: 86.8 fL (ref 80.0–100.0)
Monocytes Absolute: 0.3 10*3/uL (ref 0.2–0.9)
Monocytes Relative: 9 %
Neutro Abs: 1.7 10*3/uL (ref 1.4–6.5)
Neutrophils Relative %: 47 %
Platelets: 176 10*3/uL (ref 150–440)
RBC: 4.35 MIL/uL (ref 3.80–5.20)
RDW: 14.6 % — ABNORMAL HIGH (ref 11.5–14.5)
WBC: 3.6 10*3/uL (ref 3.6–11.0)

## 2016-09-24 NOTE — Progress Notes (Signed)
Radiation Oncology Follow up Note  Name: Abigail Wheeler   Date:   09/24/2016 MRN:  [id number] DOB: 11-Oct-1950    REFERRING PROVIDER: Lavera Guise, MD  TGG:YIRS 66 y.o. female presents to the clinic today for Patient is a 66 year old female now out 2 half years have included radiation therapy to her right chest wall and peripheral lymphatics status post right modified radical mastectomy for stage IIIB multifocalinvasive mammary carcinoma the right breast.Tumor was ER positive PR negative HER-2/neu not overexpressed. She underwent Adriamycin Cytoxan followed by weekly Taxol. She then received radiation to right chest wall and peripheral lymphatics she is seen today in routine follow-up is doing well. She's currently on Femara tolerate that well without side effect. She had a mammogram of her left breast back in December showing BI-RADS 2 benign. She specifically denies any new nodularity in the right chest wall cough or bone pain..   COMPLICATIONS OF TREATMENT: none  FOLLOW UP COMPLIANCE: keeps appointments   PHYSICAL EXAM:   Patient is status post right modified radical mastectomy. Chest walls clear without evidence of nodularity or mass. Left breast is free of dominant mass or nodularity in 2 positions examined. No evidence of lymphedema of her right upper extremity is noted. Well-developed well-nourished patient in NAD. HEENT reveals PERLA, EOMI, discs not visualized.  Oral cavity is clear. No oral mucosal lesions are identified. Neck is clear without evidence of cervical or supraclavicular adenopathy. Lungs are clear to A&P. Cardiac examination is essentially unremarkable with regular rate and rhythm without murmur rub or thrill. Abdomen is benign with no organomegaly or masses noted. Motor sensory and DTR levels are equal and symmetric in the upper and lower extremities. Cranial nerves II through XII are grossly intact. Proprioception is intact. No peripheral adenopathy or edema is identified.  No motor or sensory levels are noted. Crude visual fields are within normal range..  BP (!) 159/80   Pulse 81   Temp (!) 96.9 F (36.1 C)   Resp 18   Wt 149 lb 9.3 oz (67.8 kg)   BMI 28.26 kg/m   RADIOLOGY RESULTS: Unilateral mammogram of the left breast is reviewed and compatible with the above-stated findings  PLAN:  Present time patient is to half years out and is doing well with no evidence of disease. I'm please were overall progress. I've asked to see her back in 1 year for follow-up. She continues on Femara without side effect. Patient knows to call with any concerns.  I would like to take this opportunity to thank you for allowing me to participate in the care of your patient.Armstead Peaks., MD

## 2016-10-04 ENCOUNTER — Ambulatory Visit: Payer: 59 | Admitting: Radiation Oncology

## 2016-10-18 ENCOUNTER — Ambulatory Visit: Payer: 59 | Admitting: Radiation Oncology

## 2016-11-05 DIAGNOSIS — H1859 Other hereditary corneal dystrophies: Secondary | ICD-10-CM | POA: Diagnosis not present

## 2016-12-31 DIAGNOSIS — C50911 Malignant neoplasm of unspecified site of right female breast: Secondary | ICD-10-CM | POA: Diagnosis not present

## 2017-01-15 DIAGNOSIS — C50911 Malignant neoplasm of unspecified site of right female breast: Secondary | ICD-10-CM | POA: Diagnosis not present

## 2017-02-12 ENCOUNTER — Other Ambulatory Visit: Payer: Self-pay

## 2017-02-12 DIAGNOSIS — Z1231 Encounter for screening mammogram for malignant neoplasm of breast: Secondary | ICD-10-CM

## 2017-02-20 ENCOUNTER — Ambulatory Visit: Payer: 59 | Admitting: Hematology and Oncology

## 2017-02-20 ENCOUNTER — Other Ambulatory Visit: Payer: 59

## 2017-02-24 ENCOUNTER — Other Ambulatory Visit: Payer: Self-pay | Admitting: *Deleted

## 2017-02-24 DIAGNOSIS — Z853 Personal history of malignant neoplasm of breast: Secondary | ICD-10-CM

## 2017-02-25 ENCOUNTER — Other Ambulatory Visit: Payer: Self-pay | Admitting: Hematology and Oncology

## 2017-02-25 ENCOUNTER — Inpatient Hospital Stay: Payer: 59 | Attending: Hematology and Oncology

## 2017-02-25 ENCOUNTER — Telehealth: Payer: Self-pay | Admitting: *Deleted

## 2017-02-25 ENCOUNTER — Inpatient Hospital Stay (HOSPITAL_BASED_OUTPATIENT_CLINIC_OR_DEPARTMENT_OTHER): Payer: 59 | Admitting: Hematology and Oncology

## 2017-02-25 ENCOUNTER — Other Ambulatory Visit: Payer: Self-pay | Admitting: *Deleted

## 2017-02-25 VITALS — BP 137/87 | HR 84 | Temp 97.7°F | Resp 18 | Wt 155.2 lb

## 2017-02-25 DIAGNOSIS — Z853 Personal history of malignant neoplasm of breast: Secondary | ICD-10-CM

## 2017-02-25 DIAGNOSIS — Z9011 Acquired absence of right breast and nipple: Secondary | ICD-10-CM

## 2017-02-25 DIAGNOSIS — E119 Type 2 diabetes mellitus without complications: Secondary | ICD-10-CM | POA: Diagnosis not present

## 2017-02-25 DIAGNOSIS — Z923 Personal history of irradiation: Secondary | ICD-10-CM

## 2017-02-25 DIAGNOSIS — K219 Gastro-esophageal reflux disease without esophagitis: Secondary | ICD-10-CM

## 2017-02-25 DIAGNOSIS — C50911 Malignant neoplasm of unspecified site of right female breast: Secondary | ICD-10-CM | POA: Insufficient documentation

## 2017-02-25 DIAGNOSIS — Z86711 Personal history of pulmonary embolism: Secondary | ICD-10-CM | POA: Diagnosis not present

## 2017-02-25 DIAGNOSIS — Z79811 Long term (current) use of aromatase inhibitors: Secondary | ICD-10-CM

## 2017-02-25 DIAGNOSIS — Z7984 Long term (current) use of oral hypoglycemic drugs: Secondary | ICD-10-CM | POA: Diagnosis not present

## 2017-02-25 DIAGNOSIS — I10 Essential (primary) hypertension: Secondary | ICD-10-CM | POA: Insufficient documentation

## 2017-02-25 DIAGNOSIS — Z87442 Personal history of urinary calculi: Secondary | ICD-10-CM

## 2017-02-25 DIAGNOSIS — M85852 Other specified disorders of bone density and structure, left thigh: Secondary | ICD-10-CM

## 2017-02-25 DIAGNOSIS — Z9221 Personal history of antineoplastic chemotherapy: Secondary | ICD-10-CM | POA: Diagnosis not present

## 2017-02-25 DIAGNOSIS — Z803 Family history of malignant neoplasm of breast: Secondary | ICD-10-CM

## 2017-02-25 DIAGNOSIS — Z7901 Long term (current) use of anticoagulants: Secondary | ICD-10-CM

## 2017-02-25 DIAGNOSIS — Z801 Family history of malignant neoplasm of trachea, bronchus and lung: Secondary | ICD-10-CM | POA: Diagnosis not present

## 2017-02-25 DIAGNOSIS — Z170 Estrogen receptor positive status [ER+]: Secondary | ICD-10-CM | POA: Diagnosis not present

## 2017-02-25 DIAGNOSIS — M129 Arthropathy, unspecified: Secondary | ICD-10-CM

## 2017-02-25 DIAGNOSIS — Z79899 Other long term (current) drug therapy: Secondary | ICD-10-CM | POA: Diagnosis not present

## 2017-02-25 DIAGNOSIS — M8580 Other specified disorders of bone density and structure, unspecified site: Secondary | ICD-10-CM | POA: Insufficient documentation

## 2017-02-25 LAB — COMPREHENSIVE METABOLIC PANEL
ALT: 25 U/L (ref 14–54)
AST: 24 U/L (ref 15–41)
Albumin: 4 g/dL (ref 3.5–5.0)
Alkaline Phosphatase: 65 U/L (ref 38–126)
Anion gap: 6 (ref 5–15)
BUN: 14 mg/dL (ref 6–20)
CO2: 28 mmol/L (ref 22–32)
Calcium: 9.8 mg/dL (ref 8.9–10.3)
Chloride: 102 mmol/L (ref 101–111)
Creatinine, Ser: 0.86 mg/dL (ref 0.44–1.00)
GFR calc Af Amer: >60 mL/min (ref >60)
GFR calc non Af Amer: >60 mL/min (ref >60)
Glucose, Bld: 105 mg/dL — ABNORMAL HIGH (ref 65–99)
Potassium: 3.9 mmol/L (ref 3.5–5.1)
Sodium: 136 mmol/L (ref 135–145)
Total Bilirubin: 0.6 mg/dL (ref 0.3–1.2)
Total Protein: 8 g/dL (ref 6.5–8.1)

## 2017-02-25 LAB — CBC WITH DIFFERENTIAL/PLATELET
Basophils Absolute: 0.1 10*3/uL (ref 0–0.1)
Basophils Relative: 2 %
Eosinophils Absolute: 0 10*3/uL (ref 0–0.7)
Eosinophils Relative: 1 %
HCT: 40.2 % (ref 35.0–47.0)
Hemoglobin: 13.6 g/dL (ref 12.0–16.0)
Lymphocytes Relative: 45 %
Lymphs Abs: 1.9 10*3/uL (ref 1.0–3.6)
MCH: 29.9 pg (ref 26.0–34.0)
MCHC: 33.9 g/dL (ref 32.0–36.0)
MCV: 88.2 fL (ref 80.0–100.0)
Monocytes Absolute: 0.4 10*3/uL (ref 0.2–0.9)
Monocytes Relative: 9 %
Neutro Abs: 1.8 10*3/uL (ref 1.4–6.5)
Neutrophils Relative %: 43 %
Platelets: 210 10*3/uL (ref 150–440)
RBC: 4.56 MIL/uL (ref 3.80–5.20)
RDW: 14.1 % (ref 11.5–14.5)
WBC: 4.1 10*3/uL (ref 3.6–11.0)

## 2017-02-25 NOTE — Telephone Encounter (Signed)
Patient okay to receive Prolia, per insurance.

## 2017-02-25 NOTE — Progress Notes (Signed)
Patient her for follow up regarding breast cancer.  Offers no complaints today.

## 2017-02-25 NOTE — Progress Notes (Signed)
Abigail Wheeler:  02/25/17   Chief Complaint: Abigail Wheeler is an 66 y.o. female history of stage IIIB right breast cancer who is seen for 6 month assessment on Femara.  HPI: The patient was last seen in the medical oncology clinic on 08/20/2016.  At that time, she denied any concerns.  She was doing well on Femara.  Exam was stable.  Mammogram revealed no evidence of malignancy.   Bone density study on 08/27/2016 revealed osteopenia with a T-score of -1.9 in the left femoral neck and -1.2 in the AP spine L1-L4.  Symptomatically, patient has been doing well. She has no physical complaints. Patient has no breast concerns. She has experienced no B symptoms or interval infections. Patient is eating well, with no significant side effects. Patient continues on Femara as prescribed.  She is performing monthly self breast exams.    Past Medical History:  Diagnosis Date  . Arthritis   . Breast cancer ([place]) 2015   chemo and radiation  . Cancer ([place]) 05-20-13   T1 N1 ER positive/PR negative HER2 negative. right breast  . Diabetes mellitus without complication ([place])   . GERD (gastroesophageal reflux disease)   . Hypertension   . PE (pulmonary embolism)   . Renal calculi    Past Surgical History:  Procedure Laterality Date  . BREAST BIOPSY Right 04-05-13   positive  . BREAST SURGERY Right 05/20/13   mastectomy  . CESAREAN SECTION  1991  . MASTECTOMY Right 2015   chemo and radiation   Family History  Problem Relation Age of Onset  . Lung cancer Father   . Breast cancer Maternal Grandmother 68   Social History:  reports that  has never smoked. she has never used smokeless tobacco. She reports that she does not drink alcohol or use drugs.  The patient lives in [place].  She is accompanied by her husband, Richard.  Allergies: No Known Allergies  Current Medications: Current Outpatient Medications  Medication Sig Dispense Refill  .  acetaminophen (TYLENOL) 500 MG tablet Take 500 mg by mouth every 6 (six) hours as needed.    . calcium carbonate (OS-CAL) 600 MG TABS tablet Take 600 mg by mouth daily.    . Cholecalciferol (VITAMIN D-3) 1000 UNITS CAPS Take 1 capsule by mouth daily.    Marland Kitchen letrozole (FEMARA) 2.5 MG tablet Take 2.5 mg by mouth daily.    Marland Kitchen losartan-hydrochlorothiazide (HYZAAR) 100-25 MG tablet Take 1 tablet by mouth daily.    . metFORMIN (GLUCOPHAGE) 500 MG tablet Take by mouth daily.    Marland Kitchen omeprazole (PRILOSEC) 20 MG capsule TAKE 1 CAPSULE BY MOUTH DAILY 90 capsule 2  . potassium chloride SA (K-DUR,KLOR-CON) 20 MEQ tablet Take 1 tablet (20 mEq total) by mouth daily. (Patient taking differently: Take 20 mEq by mouth as needed. ) 3 tablet 0   No current facility-administered medications for this visit.    Review of Systems:  GENERAL:  Feels good.  No problems.  No fevers or sweats.  Weight up 5 pounds. PERFORMANCE STATUS (ECOG):  0 HEENT:  No visual changes, runny nose, sore throat, mouth sores or tenderness. Lungs: No shortness of breath or cough.  No hemoptysis. Cardiac:  No chest pain, palpitations, orthopnea, or PND. GI:  No nausea, vomiting, diarrhea, constipation, melena or hematochezia. GU:  No urgency, frequency, dysuria, or hematuria. Musculoskeletal:  No back pain.  No joint pain.  No muscle tenderness. Extremities:  No pain  or swelling. Skin:  No rashes or skin changes. Neuro:  No headache, numbness or weakness, balance or coordination issues. Endocrine:  No diabetes, thyroid issues, hot flashes or night sweats. Psych:  No mood changes, depression or anxiety. Pain:  No focal pain. Review of systems:  All other systems reviewed and found to be negative.   Physical Exam: Blood pressure 137/87, pulse 84, temperature 97.7 F (36.5 C), temperature source Tympanic, resp. rate 18, weight 155 lb 4 oz (70.4 kg). GENERAL:  Well developed, well nourished woman sitting comfortably in the exam room in no  acute distress. MENTAL STATUS:  Alert and oriented to person, place and time. HEAD:  Short styled brown hair.  Normocephalic, atraumatic, face symmetric, no Cushingoid features. EYES:  Glasses.  Brown eyes.  Arcus senilis.  Pupils equal round and reactive to light and accomodation.  No conjunctivitis or scleral icterus. ENT:  Oropharynx clear without lesion.  Torus.  Tongue normal.  Mucous membranes moist.  RESPIRATORY:  Clear to auscultation without rales, wheezes or rhonchi. CARDIOVASCULAR:  Regular rate and rhythm without murmur, rub or gallop. BREAST:  Right sided mastectomy.  No skin changes, erythema or nodularity.  Left nipple inverted.  Fibrocystic changes superiorly and laterally.  No masses, skin changes or nipple discharge. ABDOMEN:  Soft, non-tender, with active bowel sounds, and no hepatosplenomegaly.  No masses. SKIN:  No rashes, ulcers or lesions. EXTREMITIES: No edema, no skin discoloration or tenderness.  No palpable cords. LYMPH NODES: No palpable cervical, supraclavicular, axillary or inguinal adenopathy  NEUROLOGICAL: Unremarkable. PSYCH:  Appropriate.    Appointment on 02/25/2017  Component Date Value Ref Range Status  . Sodium 02/25/2017 136  135 - 145 mmol/L Final  . Potassium 02/25/2017 3.9  3.5 - 5.1 mmol/L Final  . Chloride 02/25/2017 102  101 - 111 mmol/L Final  . CO2 02/25/2017 28  22 - 32 mmol/L Final  . Glucose, Bld 02/25/2017 105* 65 - 99 mg/dL Final  . BUN 02/25/2017 14  6 - 20 mg/dL Final  . Creatinine, Ser 02/25/2017 0.86  0.44 - 1.00 mg/dL Final  . Calcium 02/25/2017 9.8  8.9 - 10.3 mg/dL Final  . Total Protein 02/25/2017 8.0  6.5 - 8.1 g/dL Final  . Albumin 02/25/2017 4.0  3.5 - 5.0 g/dL Final  . AST 02/25/2017 24  15 - 41 U/L Final  . ALT 02/25/2017 25  14 - 54 U/L Final  . Alkaline Phosphatase 02/25/2017 65  38 - 126 U/L Final  . Total Bilirubin 02/25/2017 0.6  0.3 - 1.2 mg/dL Final  . GFR calc non Af Amer 02/25/2017 >60  >60 mL/min Final  . GFR  calc Af Amer 02/25/2017 >60  >60 mL/min Final   Comment: (NOTE) The eGFR has been calculated using the CKD EPI equation. This calculation has not been validated in all clinical situations. eGFR's persistently <60 mL/min signify possible Chronic Kidney Disease.   . Anion gap 02/25/2017 6  5 - 15 Final  . WBC 02/25/2017 4.1  3.6 - 11.0 K/uL Final  . RBC 02/25/2017 4.56  3.80 - 5.20 MIL/uL Final  . Hemoglobin 02/25/2017 13.6  12.0 - 16.0 g/dL Final  . HCT 02/25/2017 40.2  35.0 - 47.0 % Final  . MCV 02/25/2017 88.2  80.0 - 100.0 fL Final  . MCH 02/25/2017 29.9  26.0 - 34.0 pg Final  . MCHC 02/25/2017 33.9  32.0 - 36.0 g/dL Final  . RDW 02/25/2017 14.1  11.5 - 14.5 % Final  .  Platelets 02/25/2017 210  150 - 440 K/uL Final  . Neutrophils Relative % 02/25/2017 43  % Final  . Neutro Abs 02/25/2017 1.8  1.4 - 6.5 K/uL Final  . Lymphocytes Relative 02/25/2017 45  % Final  . Lymphs Abs 02/25/2017 1.9  1.0 - 3.6 K/uL Final  . Monocytes Relative 02/25/2017 9  % Final  . Monocytes Absolute 02/25/2017 0.4  0.2 - 0.9 K/uL Final  . Eosinophils Relative 02/25/2017 1  % Final  . Eosinophils Absolute 02/25/2017 0.0  0 - 0.7 K/uL Final  . Basophils Relative 02/25/2017 2  % Final  . Basophils Absolute 02/25/2017 0.1  0 - 0.1 K/uL Final   Assessment:  KIANDRA SANGUINETTI is a 66 y.o. African American female with stage IIIB multifocal right breast cancer statsus post mastectomy and sentinel lymph node biopsy on 05/19/2013.  Pathology revealed a grade I lobular carcinoma.  The largest focus was 1.7 cm and invaded the pectoralis muscle.  Additional foci were 1.4 cm and 1 mm (two).  Sentinel lymph node revealed a 1.7 mm focus without extracapsular extension.  Tumor was ER positive, PR negative, and HER-2/neu  negative.  She received 4 cycles of Adriamycin and Cytoxan (06/18/2013 - 08/19/2013) followed by 12 weeks of Taxol (09/09/2013 - 11/25/2013).  She received radiation to the chest wall and right axillae from  12/28/2013 - 02/18/2014.  Following completion of radiation, she began Femara.  Left sided mammogram on 04/18/2016 revealed no evidence of malignancy.    CA27.29 has been followed: 11.7 on 05/12/2013, 3.8 on 12/30/2013, 23.6 on 09/09/2014, 19 on 01/10/2015, 13.5 on 05/12/2015, 11.8 on 09/26/2015, 10.9 on 01/30/2016, and 11.7 on 08/20/2016.  Bone density study on 03/22/2014 revealed osteopenia with a T score of -1.6 in the right femur neck and -1.3 in L1-L4 spine.  Bone density study on 08/27/2016 revealed osteopenia with a T-score of -1.9 in the left femoral neck and -1.2 in the AP spine L1-L4.  She is on calcium and vitamin D.  She was diagnosed with multiple left lower pulmonary emboli on 08/08/2013.  Hypercoagulable work-up on 01/10/2015 was normal (Factor V Leiden, prothrombin gene mutation, lupus anticoagulant panel, anticardiolipin antibodies, protein C antigen/activity, and protein S antigen/activity).  Chest CT angiogram on 05/21/2016 revealed no evidence of pulmonary embolism.  She is on Xarelto.   Symptomatically, she is doing well on Femara.  Exam is stable.  Labs are unremarkable.   Plan: 1.  Labs today:  CBC with diff, CMP, CA27.29. 2.  Discuss interval bone density study- progressive osteopenia. Ten-year fracture probability by FRAX of 3% for hip fracture or 20% or greater for major osteoporotic fracture. Patient on supplemental calcium and vitamin D daily. Discuss Prolia to prevent further bone thinning associated with aromatase inhibitor therapy. Patient will require dental clearance prior to initiation of Prolia therapy. Patient sees Dr. Nathaneil Canary Dia[place] [phone]).  Patient to contact clinic after dental clearance. 3.  Continue Femara as prescribed.  4.  Preauthorize Prolia.  5.  Annual mammogram scheduled for 04/21/2017 already ordered.  6.  RTC in 6 months for MD assessment and labs (CBC with diff, CMP, CA27.29).    Honor Loh, NP  02/25/2017, 11:12 AM   I saw and  evaluated the patient, participating in the key portions of the service and reviewing pertinent diagnostic studies and records.  I reviewed the nurse practitioner's note and agree with the findings and the plan.  The assessment and plan were discussed with the patient.  Several questions  were asked by the patient and answered.   Nolon Stalls, MD 02/25/2017, 11:12 AM

## 2017-02-26 LAB — CA 27.29 (SERIAL MONITOR): CA 27.29: 11.5 U/mL (ref 0.0–38.6)

## 2017-03-02 ENCOUNTER — Encounter: Payer: Self-pay | Admitting: Hematology and Oncology

## 2017-04-17 ENCOUNTER — Encounter: Payer: Self-pay | Admitting: General Surgery

## 2017-04-17 ENCOUNTER — Ambulatory Visit (INDEPENDENT_AMBULATORY_CARE_PROVIDER_SITE_OTHER): Payer: Medicare Other | Admitting: General Surgery

## 2017-04-17 VITALS — BP 132/80 | HR 135 | Resp 13 | Ht 62.0 in | Wt 158.0 lb

## 2017-04-17 DIAGNOSIS — Z853 Personal history of malignant neoplasm of breast: Secondary | ICD-10-CM | POA: Diagnosis not present

## 2017-04-17 NOTE — Progress Notes (Signed)
Patient ID: Abigail Wheeler, female   DOB: 01-22-1951, 66 y.o.   MRN: [id number]  Chief Complaint  Patient presents with  . Follow-up    HPI Geneive Sandstrom Radich is a 66 y.o. female who presents for a breast cancer follow-up. Left breast mammogram is scheduled for 04/21/2017.  Patient does perform regular self breast checks and gets regular mammograms done. Doing well on the Femara.  Patient is going to see Dr. Humphrey Rolls on 04/24/2017.  HPI  Past Medical History:  Diagnosis Date  . Arthritis   . Breast cancer ([place]) 2015   chemo and radiation  . Cancer ([place]) 05-20-13   T1 N1 ER positive/PR negative HER2 negative. right breast  . Diabetes mellitus without complication ([employer])   . GERD (gastroesophageal reflux disease)   . Hypertension   . PE (pulmonary embolism)   . Renal calculi     Past Surgical History:  Procedure Laterality Date  . BREAST BIOPSY Right 04-05-13   positive  . BREAST SURGERY Right 05/20/13   mastectomy  . CESAREAN SECTION  1991  . MASTECTOMY Right 2015   chemo and radiation    Family History  Problem Relation Age of Onset  . Lung cancer Father   . Breast cancer Maternal Grandmother 68    Social History Social History   Tobacco Use  . Smoking status: Never Smoker  . Smokeless tobacco: Never Used  Substance Use Topics  . Alcohol use: No  . Drug use: No    No Known Allergies  Current Outpatient Medications  Medication Sig Dispense Refill  . acetaminophen (TYLENOL) 500 MG tablet Take 500 mg by mouth every 6 (six) hours as needed.    . calcium carbonate (OS-CAL) 600 MG TABS tablet Take 600 mg by mouth daily.    . Cholecalciferol (VITAMIN D-3) 1000 UNITS CAPS Take 1 capsule by mouth daily.    Marland Kitchen letrozole (FEMARA) 2.5 MG tablet Take 2.5 mg by mouth daily.    Marland Kitchen losartan-hydrochlorothiazide (HYZAAR) 100-25 MG tablet Take 1 tablet by mouth daily.    . metFORMIN (GLUCOPHAGE) 500 MG tablet Take by mouth daily.    Marland Kitchen omeprazole (PRILOSEC) 20 MG capsule TAKE 1  CAPSULE BY MOUTH DAILY 90 capsule 2  . potassium chloride SA (K-DUR,KLOR-CON) 20 MEQ tablet Take 1 tablet (20 mEq total) by mouth daily. (Patient taking differently: Take 20 mEq by mouth as needed. ) 3 tablet 0   No current facility-administered medications for this visit.     Review of Systems Review of Systems  Constitutional: Negative.   Respiratory: Negative.   Cardiovascular: Negative.     Blood pressure 132/80, pulse (!) 135, resp. rate 13, height 5' 2" (1.575 m), weight 158 lb (71.7 kg).  Physical Exam Physical Exam  Constitutional: She is oriented to person, place, and time. She appears well-developed and well-nourished.  Eyes: Conjunctivae are normal. No scleral icterus.  Neck: Neck supple.  Cardiovascular: Normal rate, regular rhythm and normal heart sounds.  Pulmonary/Chest: [place] normal and breath sounds normal. Left breast exhibits no inverted nipple, no mass, no nipple discharge, no skin change and no tenderness.    Abdominal: Soft. Bowel sounds are normal. There is no hepatomegaly. There is no tenderness.  Lymphadenopathy:    She has no cervical adenopathy.    She has no axillary adenopathy.  Neurological: She is alert and oriented to person, place, and time.  Skin: Skin is warm and dry.    Data Reviewed Prior notes  Assessment  4 yrs post right Mastectomy and SN biopsy for T1,N1 CA with focal muscle involvement.    Plan   If the mammogram next week appears to be normal she will return here in 1 year Patient to return in one year with left screening mammogram with Dr. Collene Schlichter. The patient is aware to call back for any questions or concerns. Continue follow-up with oncology   HPI, Physical Exam, Assessment and Plan have been scribed under the direction and in the presence of Mckinley Jewel, MD  Gaspar Cola, CMA I have completed the exam and reviewed the above documentation for accuracy and completeness.  I agree with the above.  Haematologist  has been used and any errors in dictation or transcription are unintentional.  Seeplaputhur G. Jamal Collin, M.D., F.A.C.S.  Junie Panning G 04/17/2017, 2:36 PM

## 2017-04-17 NOTE — Patient Instructions (Signed)
Patient to return in one year  for left screening mammogram with Dr. Collene Schlichter. The patient is aware to call back for any questions or concerns.

## 2017-04-21 ENCOUNTER — Other Ambulatory Visit: Payer: Self-pay | Admitting: General Surgery

## 2017-04-21 ENCOUNTER — Ambulatory Visit
Admission: RE | Admit: 2017-04-21 | Discharge: 2017-04-21 | Disposition: A | Discharge status: Home / Self Care | Payer: 59 | Source: Ambulatory Visit | Attending: General Surgery | Admitting: General Surgery

## 2017-04-21 DIAGNOSIS — Z1231 Encounter for screening mammogram for malignant neoplasm of breast: Secondary | ICD-10-CM

## 2017-04-23 DIAGNOSIS — N390 Urinary tract infection, site not specified: Secondary | ICD-10-CM | POA: Insufficient documentation

## 2017-04-23 DIAGNOSIS — E119 Type 2 diabetes mellitus without complications: Secondary | ICD-10-CM | POA: Insufficient documentation

## 2017-04-23 DIAGNOSIS — I10 Essential (primary) hypertension: Secondary | ICD-10-CM | POA: Insufficient documentation

## 2017-04-23 DIAGNOSIS — E876 Hypokalemia: Secondary | ICD-10-CM | POA: Insufficient documentation

## 2017-04-23 DIAGNOSIS — I499 Cardiac arrhythmia, unspecified: Secondary | ICD-10-CM | POA: Insufficient documentation

## 2017-04-23 DIAGNOSIS — E559 Vitamin D deficiency, unspecified: Secondary | ICD-10-CM | POA: Insufficient documentation

## 2017-04-23 DIAGNOSIS — R0600 Dyspnea, unspecified: Secondary | ICD-10-CM | POA: Insufficient documentation

## 2017-04-24 ENCOUNTER — Ambulatory Visit (INDEPENDENT_AMBULATORY_CARE_PROVIDER_SITE_OTHER): Payer: 59 | Admitting: Nurse Practitioner

## 2017-04-24 ENCOUNTER — Encounter: Payer: Self-pay | Admitting: Nurse Practitioner

## 2017-04-24 VITALS — BP 151/85 | HR 105 | Resp 16 | Ht 61.0 in | Wt 158.4 lb

## 2017-04-24 DIAGNOSIS — C50911 Malignant neoplasm of unspecified site of right female breast: Secondary | ICD-10-CM | POA: Diagnosis not present

## 2017-04-24 DIAGNOSIS — E559 Vitamin D deficiency, unspecified: Secondary | ICD-10-CM

## 2017-04-24 DIAGNOSIS — I10 Essential (primary) hypertension: Secondary | ICD-10-CM | POA: Diagnosis not present

## 2017-04-24 DIAGNOSIS — Z0001 Encounter for general adult medical examination with abnormal findings: Secondary | ICD-10-CM | POA: Diagnosis not present

## 2017-04-24 DIAGNOSIS — E119 Type 2 diabetes mellitus without complications: Secondary | ICD-10-CM

## 2017-04-24 LAB — POCT GLYCOSYLATED HEMOGLOBIN (HGB A1C): Hemoglobin A1C: 5.9

## 2017-04-24 NOTE — Patient Instructions (Addendum)

## 2017-04-24 NOTE — Progress Notes (Signed)
[facility] [place], [place] [address]  Internal MEDICINE  Office Visit Note  Patient Name: Abigail Wheeler  [id number]  [id number]  Date of Service: 04/24/2017     Complaints/HPI Pt is here for routine follow up.  The patient is here for health maintenance exam. States that she had elevated heart rate at her last check with surgeon. States that her heart rate was 135. She denies chest pain, palpitations, or shortness of breath. BP is slightly elevated today, but patient does state that she had some chips a little while ago.  She sees general surgery every year as follow up from breast cancer. Her mammogram was 04/21/2017 and was negative.  She is due to have routine, fasting blood work done.     Current Medication: Outpatient Encounter Medications as of 04/24/2017  Medication Sig  . acetaminophen (TYLENOL) 500 MG tablet Take 500 mg by mouth every 6 (six) hours as needed.  . calcium carbonate (OS-CAL) 600 MG TABS tablet Take 600 mg by mouth daily.  . Cholecalciferol (VITAMIN D-3) 1000 UNITS CAPS Take 1 capsule by mouth daily.  Marland Kitchen letrozole (FEMARA) 2.5 MG tablet Take 2.5 mg by mouth daily.  Marland Kitchen losartan-hydrochlorothiazide (HYZAAR) 100-25 MG tablet Take 1 tablet by mouth daily.  . metFORMIN (GLUCOPHAGE) 500 MG tablet Take by mouth daily.  Marland Kitchen omeprazole (PRILOSEC) 20 MG capsule TAKE 1 CAPSULE BY MOUTH DAILY  . potassium chloride SA (K-DUR,KLOR-CON) 20 MEQ tablet Take 1 tablet (20 mEq total) by mouth daily. (Patient taking differently: Take 20 mEq by mouth as needed. )   No facility-administered encounter medications on file as of 04/24/2017.     Surgical History: Past Surgical History:  Procedure Laterality Date  . BREAST BIOPSY Right 04-05-13   positive  . BREAST SURGERY Right 05/20/13   mastectomy  . CESAREAN SECTION  1991  . MASTECTOMY Right 2015   chemo and radiation    Medical History: Past Medical History:  Diagnosis Date  . Arthritis   .  Breast cancer ([place]) 2015   chemo and radiation  . Cancer ([place]) 05-20-13   T1 N1 ER positive/PR negative HER2 negative. right breast  . Diabetes mellitus without complication ([place])   . GERD (gastroesophageal reflux disease)   . Hypertension   . PE (pulmonary embolism)   . Renal calculi     Family History: Family History  Problem Relation Age of Onset  . Lung cancer Father   . Breast cancer Maternal Grandmother 68    Social History   Socioeconomic History  . Marital status: Married    Spouse name: Not on file  . Number of children: Not on file  . Years of education: Not on file  . Highest education level: Not on file  Social Needs  . Financial resource strain: Not on file  . Food insecurity - worry: Not on file  . Food insecurity - inability: Not on file  . Transportation needs - medical: Not on file  . Transportation needs - non-medical: Not on file  Occupational History  . Not on file  Tobacco Use  . Smoking status: Never Smoker  . Smokeless tobacco: Never Used  Substance and Sexual Activity  . Alcohol use: No  . Drug use: No  . Sexual activity: Yes  Other Topics Concern  . Not on file  Social History Narrative  . Not on file    Today's Vitals   04/24/17 1358  BP: (!) 151/85  Pulse: (!) 105  Resp: 16  SpO2: 99%  Weight: 158 lb 6.4 oz (71.8 kg)  Height: 5' 1" (1.549 m)    Review of Systems  Constitutional: Negative for chills and fever.  HENT: Negative for congestion and sinus pain.   Eyes: Negative.   Respiratory: Negative for cough and wheezing.   Cardiovascular: Negative for chest pain and palpitations.  Gastrointestinal: Negative for constipation.       Abdominal hernia which does not bother her   Genitourinary: Negative.   Musculoskeletal: Negative for back pain and myalgias.  Skin: Negative for itching and rash.  Neurological: Negative for dizziness, weakness and headaches.  Psychiatric/Behavioral: Negative for suicidal ideas.  All other  systems reviewed and are negative.    Physical Exam  Constitutional: She is oriented to person, place, and time and well-developed, well-nourished, and in no distress.  HENT:  Head: Normocephalic and atraumatic.  Eyes: Pupils are equal, round, and reactive to light.  Neck: Normal range of motion. Neck supple. No thyromegaly present.  Cardiovascular: Normal rate, regular rhythm and normal heart sounds.  Pulmonary/Chest: [place] normal and breath sounds normal. She has no wheezes.  Abdominal: Soft. Bowel sounds are normal. There is no tenderness.  Musculoskeletal: Normal range of motion.  Neurological: She is alert and oriented to person, place, and time.  Skin: Skin is warm and dry.  Psychiatric: Affect normal.  Nursing note and vitals reviewed.    ICD-10-CM   1. Encounter for health maintenance examination with abnormal findings Z00.01 Urinalysis, Routine w reflex microscopic    Lipid Profile    TSH + free T4  2. Vitamin D deficiency E55.9 Vitamin D 1,25 dihydroxy  3. Type 2 diabetes mellitus without complication, without long-term current use of insulin (HCC) E11.9   4. Essential hypertension I10   5. Malignant neoplasm of right female breast, unspecified estrogen receptor status, unspecified site of breast ([place]) C50.911    . 1. Annual wellness visit today.  2. DM2 - HgbA1c 5.9 today. Continue metformin as prescribed. Monitor sugars daily.  3. bp stable. Continue bp medications as prescribed.  4. Check vitamin d level and treat as indicated.  5. Most recent mammoram benign. Continue regular visits with surgery and oncology as scheduled for monitoring.   General Counseling: I have discussed the findings of the evaluation and examination with Abigail Wheeler.  I have also discussed any further diagnostic evaluation that may be needed or ordered today. Abigail Wheeler verbalizes understanding of the findings of todays visit. We also reviewed her medications today. she has been encouraged to call the  office with any questions or concerns that should arise related to todays visit.  This patient was seen by Leretha Pol, FNP- C in Collaboration with Dr Lavera Guise as a part of collaborative care agreement  Follow up 4 months and sooner if needed  Patient Active Problem List   Diagnosis Date Noted  . Essential hypertension 04/23/2017  . Urinary tract infection 04/23/2017  . Type 2 diabetes mellitus without complications ([employer]) 54/62/7035  . Vitamin D deficiency 04/23/2017  . Cardiac arrhythmia 04/23/2017  . Dyspnea 04/23/2017  . Hypokalemia 04/23/2017  . Osteopenia 02/25/2017  . Pulmonary embolism on left ([place]) 08/08/2013  . Other pulmonary embolism without acute cor pulmonale ([place]) 08/08/2013  . Breast cancer, right ([place]) 05/11/2013  . Malignant neoplasm of breast (female) ([place]) 04/09/2013

## 2017-04-25 LAB — LIPID PANEL

## 2017-04-25 LAB — MICROALBUMIN / CREATININE URINE RATIO
CREATININE, UR: 105.3 mg/dL
MICROALBUM., U, RANDOM: 14.1 ug/mL
Microalb/Creat Ratio: 13.4 mg/g creat (ref 0.0–30.0)

## 2017-04-25 LAB — TSH+FREE T4

## 2017-04-25 LAB — VITAMIN D 1,25 DIHYDROXY

## 2017-07-03 ENCOUNTER — Other Ambulatory Visit: Payer: Self-pay | Admitting: Internal Medicine

## 2017-07-22 ENCOUNTER — Other Ambulatory Visit: Payer: Self-pay | Admitting: Internal Medicine

## 2017-08-18 ENCOUNTER — Telehealth: Payer: Self-pay

## 2017-08-18 ENCOUNTER — Ambulatory Visit: Payer: 59 | Admitting: Nurse Practitioner

## 2017-08-18 VITALS — BP 136/98 | HR 111 | Temp 98.6°F | Resp 16 | Ht 61.0 in | Wt 158.8 lb

## 2017-08-18 DIAGNOSIS — I10 Essential (primary) hypertension: Secondary | ICD-10-CM

## 2017-08-18 DIAGNOSIS — C50911 Malignant neoplasm of unspecified site of right female breast: Secondary | ICD-10-CM

## 2017-08-18 DIAGNOSIS — Z794 Long term (current) use of insulin: Secondary | ICD-10-CM | POA: Diagnosis not present

## 2017-08-18 DIAGNOSIS — N390 Urinary tract infection, site not specified: Secondary | ICD-10-CM

## 2017-08-18 DIAGNOSIS — E119 Type 2 diabetes mellitus without complications: Secondary | ICD-10-CM

## 2017-08-18 DIAGNOSIS — R300 Dysuria: Secondary | ICD-10-CM

## 2017-08-18 LAB — POCT URINALYSIS DIPSTICK
Bilirubin, UA: NEGATIVE
GLUCOSE UA: NEGATIVE
Nitrite, UA: NEGATIVE
Spec Grav, UA: 1.01 (ref 1.010–1.025)
Urobilinogen, UA: 0.2 E.U./dL
pH, UA: 6.5 (ref 5.0–8.0)

## 2017-08-18 MED ORDER — METFORMIN HCL ER 500 MG PO TB24: 500.0000 mg | ORAL_TABLET | Freq: Every day | ORAL | 5 refills | Status: DC

## 2017-08-18 MED ORDER — AMOXICILLIN-POT CLAVULANATE 875-125 MG PO TABS: 1.0000 | ORAL_TABLET | Freq: Two times a day (BID) | ORAL | 0 refills | Status: DC

## 2017-08-18 MED ORDER — PHENAZOPYRIDINE HCL 200 MG PO TABS: 200.0000 mg | ORAL_TABLET | Freq: Three times a day (TID) | ORAL | 0 refills | Status: DC | PRN

## 2017-08-18 NOTE — Patient Instructions (Signed)

## 2017-08-18 NOTE — Telephone Encounter (Signed)
Sent note to provider.  dbs 

## 2017-08-18 NOTE — Progress Notes (Signed)
[facility] [place], [place] [address]  Internal MEDICINE  Office Visit Note  Patient Name: Abigail Wheeler  [id number]  [id number]  Date of Service: 08/19/2017   Pt is here for routine follow up.   Chief Complaint  Patient presents with  . Urinary Tract Infection    burning during urination     Urinary Tract Infection   This is a new problem. The current episode started in the past 7 days. The problem occurs every urination. The problem has been unchanged. The quality of the pain is described as burning. The pain is at a severity of 3/10. The pain is moderate. There has been no fever. Associated symptoms include flank pain, frequency, nausea and urgency. Pertinent negatives include no chills or vomiting. She has tried nothing for the symptoms.       Current Medication: Outpatient Encounter Medications as of 08/18/2017  Medication Sig  . acetaminophen (TYLENOL) 500 MG tablet Take 500 mg by mouth every 6 (six) hours as needed.  Marland Kitchen amoxicillin-clavulanate (AUGMENTIN) 875-125 MG tablet Take 1 tablet by mouth 2 (two) times daily.  . calcium carbonate (OS-CAL) 600 MG TABS tablet Take 600 mg by mouth daily.  . Cholecalciferol (VITAMIN D-3) 1000 UNITS CAPS Take 1 capsule by mouth daily.  Marland Kitchen letrozole (FEMARA) 2.5 MG tablet TAKE 1 TABLET BY MOUTH ONCE DAILY  . losartan (COZAAR) 25 MG tablet TAKE 1 TABLET BY MOUTH ONCE DAILY FOR BLOOD PRESSURE  . losartan-hydrochlorothiazide (HYZAAR) 100-25 MG tablet Take 1 tablet by mouth daily.  . metFORMIN (GLUCOPHAGE-XR) 500 MG 24 hr tablet Take 1 tablet (500 mg total) by mouth daily with breakfast.  . omeprazole (PRILOSEC) 20 MG capsule TAKE 1 CAPSULE BY MOUTH ONCE DAILY  . phenazopyridine (PYRIDIUM) 200 MG tablet Take 1 tablet (200 mg total) by mouth 3 (three) times daily as needed for pain.  . potassium chloride SA (K-DUR,KLOR-CON) 20 MEQ tablet Take 1 tablet (20 mEq total) by mouth daily. (Patient taking differently: Take  20 mEq by mouth as needed. )  . [DISCONTINUED] metFORMIN (GLUCOPHAGE) 500 MG tablet Take by mouth daily.   No facility-administered encounter medications on file as of 08/18/2017.     Surgical History: Past Surgical History:  Procedure Laterality Date  . BREAST BIOPSY Right 04-05-13   positive  . BREAST SURGERY Right 05/20/13   mastectomy  . CESAREAN SECTION  1991  . MASTECTOMY Right 2015   chemo and radiation    Medical History: Past Medical History:  Diagnosis Date  . Arthritis   . Breast cancer ([place]) 2015   chemo and radiation  . Cancer ([employer]) 05-20-13   T1 N1 ER positive/PR negative HER2 negative. right breast  . Diabetes mellitus without complication ([place])   . GERD (gastroesophageal reflux disease)   . Hypertension   . PE (pulmonary embolism)   . Renal calculi     Family History: Family History  Problem Relation Age of Onset  . Lung cancer Father   . Breast cancer Maternal Grandmother 68    Social History   Socioeconomic History  . Marital status: Married    Spouse name: Not on file  . Number of children: Not on file  . Years of education: Not on file  . Highest education level: Not on file  Occupational History  . Not on file  Social Needs  . Financial resource strain: Not on file  . Food insecurity:    Worry: Not on file  Inability: Not on file  . Transportation needs:    Medical: Not on file    Non-medical: Not on file  Tobacco Use  . Smoking status: Never Smoker  . Smokeless tobacco: Never Used  Substance and Sexual Activity  . Alcohol use: No  . Drug use: No  . Sexual activity: Yes  Lifestyle  . Physical activity:    Days per week: Not on file    Minutes per session: Not on file  . Stress: Not on file  Relationships  . Social connections:    Talks on phone: Not on file    Gets together: Not on file    Attends religious service: Not on file    Active member of club or organization: Not on file    Attends meetings of clubs or  organizations: Not on file    Relationship status: Not on file  . Intimate partner violence:    Fear of current or ex partner: Not on file    Emotionally abused: Not on file    Physically abused: Not on file    Forced sexual activity: Not on file  Other Topics Concern  . Not on file  Social History Narrative  . Not on file      Review of Systems  Constitutional: Negative for activity change, appetite change, chills, fatigue, fever and unexpected weight change.  HENT: Negative for congestion, postnasal drip, rhinorrhea, sneezing and sore throat.   Eyes: Negative.  Negative for redness.  Respiratory: Negative for cough, chest tightness and shortness of breath.   Cardiovascular: Negative for chest pain and palpitations.  Gastrointestinal: Positive for nausea. Negative for abdominal pain, constipation, diarrhea and vomiting.  Endocrine:       Blood sugars doing well   Genitourinary: Positive for dysuria, flank pain, frequency and urgency.  Musculoskeletal: Positive for back pain. Negative for arthralgias, joint swelling and neck pain.  Skin: Negative for rash.  Allergic/Immunologic: Negative.   Neurological: Positive for headaches. Negative for tremors and numbness.  Hematological: Negative for adenopathy. Does not bruise/bleed easily.  Psychiatric/Behavioral: Negative for behavioral problems (Depression), sleep disturbance and suicidal ideas. The patient is not nervous/anxious.    Today's Vitals   08/18/17 1540  BP: (!) 136/98  Pulse: (!) 111  Resp: 16  Temp: 98.6 F (37 C)  SpO2: 99%  Weight: 158 lb 12.8 oz (72 kg)  Height: _0  (1.549 m)     Physical Exam  Constitutional: She is oriented to person, place, and time. She appears well-developed and well-nourished. No distress.  HENT:  Head: Normocephalic and atraumatic.  Mouth/Throat: Oropharynx is clear and moist. No oropharyngeal exudate.  Eyes: Pupils are equal, round, and reactive to light. EOM are normal.  Neck:  Normal range of motion. Neck supple. No JVD present. No tracheal deviation present. No thyromegaly present.  Cardiovascular: Normal rate, regular rhythm and normal heart sounds. Exam reveals no gallop and no friction rub.  No murmur heard. Mild tachycardia  Pulmonary/Chest: Effort normal and breath sounds normal. No respiratory distress. She has no wheezes. She has no rales. She exhibits no tenderness.  Abdominal: Soft. Bowel sounds are normal.  Genitourinary:  Genitourinary Comments: Urine sample is positive for large WBC and large blood.   Musculoskeletal: Normal range of motion.  Lymphadenopathy:    She has no cervical adenopathy.  Neurological: She is alert and oriented to person, place, and time. No cranial nerve deficit.  Skin: Skin is warm and dry. She is not diaphoretic.  Psychiatric:  She has a normal mood and affect. Her behavior is normal. Judgment and thought content normal.  Nursing note and vitals reviewed.   Assessment/Plan: 1. Urinary tract infection without hematuria, site unspecified Start augmentin 822m twice daily for 10 days. Will adjust antibiotic based on culture and sensitivity results.  - amoxicillin-clavulanate (AUGMENTIN) 875-125 MG tablet; Take 1 tablet by mouth 2 (two) times daily.  Dispense: 20 tablet; Refill: 0  2. Dysuria - POCT Urinalysis Dipstick - CULTURE, URINE COMPREHENSIVE - phenazopyridine (PYRIDIUM) 200 MG tablet; Take 1 tablet (200 mg total) by mouth 3 (three) times daily as needed for pain.  Dispense: 10 tablet; Refill: 0  3. Type 2 diabetes mellitus without complication, with long-term current use of insulin ([place] Continue diabetic medication as prescribed.  - metFORMIN (GLUCOPHAGE-XR) 500 MG 24 hr tablet; Take 1 tablet (500 mg total) by mouth daily with breakfast.  Dispense: 30 tablet; Refill: 5  4. Essential hypertension Stable. Continue bp medication as prescribed.  5. Malignant neoplasm of right female breast, unspecified estrogen  receptor status, unspecified site of breast ([facility] reglar visits with oncology and surgery as scheduled.   General Counseling: Lumi verbalizes understanding of the findings of todays visit and agrees with plan of treatment. I have discussed any further diagnostic evaluation that may be needed or ordered today. We also reviewed her medications today. she has been encouraged to call the office with any questions or concerns that should arise related to todays visit.  This patient was seen by HLeretha Pol FNP- C in Collaboration with Dr FLavera Guiseas a part of collaborative care agreement    Orders Placed This Encounter  Procedures  . CULTURE, URINE COMPREHENSIVE  . POCT Urinalysis Dipstick    Meds ordered this encounter  Medications  . phenazopyridine (PYRIDIUM) 200 MG tablet    Sig: Take 1 tablet (200 mg total) by mouth 3 (three) times daily as needed for pain.    Dispense:  10 tablet    Refill:  0    Order Specific Question:   Supervising Provider    Answer:   KLavera Guise[[[id number]] . amoxicillin-clavulanate (AUGMENTIN) 875-125 MG tablet    Sig: Take 1 tablet by mouth 2 (two) times daily.    Dispense:  20 tablet    Refill:  0    Order Specific Question:   Supervising Provider    Answer:   KLavera Guise[[[id number]] . metFORMIN (GLUCOPHAGE-XR) 500 MG 24 hr tablet    Sig: Take 1 tablet (500 mg total) by mouth daily with breakfast.    Dispense:  30 tablet    Refill:  5    Order Specific Question:   Supervising Provider    Answer:   KLavera Guise[[[id number]]   Time spent: 15 Minutes

## 2017-08-19 ENCOUNTER — Encounter: Payer: Self-pay | Admitting: Nurse Practitioner

## 2017-08-19 DIAGNOSIS — R300 Dysuria: Secondary | ICD-10-CM | POA: Insufficient documentation

## 2017-08-21 LAB — CULTURE, URINE COMPREHENSIVE

## 2017-08-25 ENCOUNTER — Ambulatory Visit: Payer: Self-pay | Admitting: Nurse Practitioner

## 2017-09-04 ENCOUNTER — Inpatient Hospital Stay: Payer: 59 | Admitting: Hematology and Oncology

## 2017-09-04 ENCOUNTER — Inpatient Hospital Stay: Payer: 59

## 2017-09-18 ENCOUNTER — Other Ambulatory Visit: Payer: Self-pay | Admitting: Podiatry

## 2017-09-18 ENCOUNTER — Encounter: Payer: Self-pay | Admitting: Podiatry

## 2017-09-18 ENCOUNTER — Ambulatory Visit (INDEPENDENT_AMBULATORY_CARE_PROVIDER_SITE_OTHER): Payer: Medicare Other

## 2017-09-18 ENCOUNTER — Ambulatory Visit (INDEPENDENT_AMBULATORY_CARE_PROVIDER_SITE_OTHER): Payer: 59 | Admitting: Podiatry

## 2017-09-18 VITALS — BP 155/91 | HR 98

## 2017-09-18 DIAGNOSIS — M2011 Hallux valgus (acquired), right foot: Secondary | ICD-10-CM | POA: Diagnosis not present

## 2017-09-18 DIAGNOSIS — M2012 Hallux valgus (acquired), left foot: Secondary | ICD-10-CM | POA: Diagnosis not present

## 2017-09-18 DIAGNOSIS — M79672 Pain in left foot: Secondary | ICD-10-CM | POA: Diagnosis not present

## 2017-09-18 DIAGNOSIS — M205X2 Other deformities of toe(s) (acquired), left foot: Secondary | ICD-10-CM | POA: Diagnosis not present

## 2017-09-18 MED ORDER — MELOXICAM 7.5 MG PO TABS: 7.5000 mg | ORAL_TABLET | Freq: Every day | ORAL | 1 refills | Status: DC

## 2017-09-19 ENCOUNTER — Ambulatory Visit: Payer: 59 | Admitting: Nurse Practitioner

## 2017-09-19 ENCOUNTER — Encounter: Payer: Self-pay | Admitting: Podiatry

## 2017-09-19 ENCOUNTER — Ambulatory Visit: Payer: Self-pay | Admitting: Nurse Practitioner

## 2017-09-19 ENCOUNTER — Encounter: Payer: Self-pay | Admitting: Nurse Practitioner

## 2017-09-19 VITALS — BP 142/77 | HR 91 | Resp 16 | Ht 62.0 in | Wt 157.0 lb

## 2017-09-19 DIAGNOSIS — N390 Urinary tract infection, site not specified: Secondary | ICD-10-CM | POA: Diagnosis not present

## 2017-09-19 DIAGNOSIS — E11649 Type 2 diabetes mellitus with hypoglycemia without coma: Secondary | ICD-10-CM | POA: Diagnosis not present

## 2017-09-19 DIAGNOSIS — E119 Type 2 diabetes mellitus without complications: Secondary | ICD-10-CM | POA: Diagnosis not present

## 2017-09-19 DIAGNOSIS — R300 Dysuria: Secondary | ICD-10-CM

## 2017-09-19 LAB — POCT URINALYSIS DIPSTICK
Bilirubin, UA: NEGATIVE
Glucose, UA: NEGATIVE
Ketones, UA: NEGATIVE
NITRITE UA: NEGATIVE
PH UA: 6.5 (ref 5.0–8.0)
Protein, UA: NEGATIVE
Spec Grav, UA: 1.01 (ref 1.010–1.025)
UROBILINOGEN UA: 0.2 U/dL

## 2017-09-19 LAB — POCT GLYCOSYLATED HEMOGLOBIN (HGB A1C): HEMOGLOBIN A1C: 5.9 % — AB (ref 4.0–5.6)

## 2017-09-19 MED ORDER — NITROFURANTOIN MONOHYD MACRO 100 MG PO CAPS: 100.0000 mg | ORAL_CAPSULE | Freq: Two times a day (BID) | ORAL | 0 refills | Status: DC

## 2017-09-19 NOTE — Progress Notes (Signed)
.   This patient presents the office with chief complaint of a painful big toe area on the left foot.  She says it has been actively painful for the last 4 weeks walking and wearing her shoes.  She says she is very active and walks significant amount at work.  She does give a history of having previously broken the toe.  She says the toe nail becomes painful and swelling after activity.  She presents the office today with no history of present injury and no self treatment, noted.  She presents the office today for an evaluation and treatment of this painful area in the big toe joint, left foot.  General Appearance  Alert, conversant and in no acute stress.  Vascular  Dorsalis pedis and posterior tibial  pulses are palpable  bilaterally.  Capillary return is within normal limits  bilaterally. Temperature is within normal limits  bilaterally.  Neurologic  Senn-Weinstein monofilament wire test within normal limits  bilaterally. Muscle power within normal limits bilaterally.  Nails Normotropic nails with no evidence of fungal or bacterial infection.  Orthopedic  No limitations of motion of motion feet .  No crepitus or effusions noted.  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  No pain or crepitus 1st MPJ right foot.  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  Limited ROM noted 1st MPJ left foot.  Palpable pain on dorsal palpation 1st MPJ  Left foot.  Skin  normotropic skin with no porokeratosis noted bilaterally.  No signs of infections or ulcers noted.  Inflamed  HAV 1st MPJ left foot.  Hallux limitus 1st MPJ left foot.  IE    . 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  Discussed this condition with this patient.  Since this pain has only been We decided to treat with Mobic.  , We will consider injection therapy in surgery in the future as needed.  RTC prn.   Gardiner Barefoot DPM

## 2017-09-19 NOTE — Progress Notes (Signed)
[facility] [place], [place] [address]  Internal MEDICINE  Office Visit Note  Patient Name: Abigail Wheeler  [id number]  [id number]  Date of Service: 10/08/2017    Pt is here for routine follow up.    Chief Complaint  Patient presents with  . Urinary Tract Infection    1 month follow up  . Diabetes    Urinary Tract Infection   This is a recurrent problem. The current episode started more than 1 month ago. The problem has been gradually improving. The pain is at a severity of 0/10. The patient is experiencing no pain. There has been no fever. There is no history of pyelonephritis. Associated symptoms include frequency, nausea and urgency. Pertinent negatives include no chills, flank pain or vomiting. She has tried antibiotics for the symptoms. The treatment provided moderate relief.  Diabetes  She presents for her follow-up diabetic visit. She has type 2 diabetes mellitus. Her disease course has been stable. There are no hypoglycemic associated symptoms. Pertinent negatives for hypoglycemia include no nervousness/anxiousness or tremors. There are no diabetic associated symptoms. Pertinent negatives for diabetes include no chest pain and no fatigue. There are no hypoglycemic complications. Symptoms are stable. There are no diabetic complications. Risk factors for coronary artery disease include hypertension and post-menopausal. Current diabetic treatment includes oral agent (monotherapy). She is compliant with treatment all of the time. She is following a generally healthy diet. Meal planning includes avoidance of concentrated sweets. She has not had a previous visit with a dietitian. She participates in exercise intermittently. There is no change in her home blood glucose trend. An ACE inhibitor/angiotensin II receptor blocker is being taken. She does not see a podiatrist.Eye exam is current.     Current Medication: Outpatient Encounter Medications as of  09/19/2017  Medication Sig  . acetaminophen (TYLENOL) 500 MG tablet Take 500 mg by mouth every 6 (six) hours as needed.  . calcium carbonate (OS-CAL) 600 MG TABS tablet Take 600 mg by mouth daily.  . Cholecalciferol (VITAMIN D-3) 1000 UNITS CAPS Take 1 capsule by mouth daily.  Marland Kitchen letrozole (FEMARA) 2.5 MG tablet TAKE 1 TABLET BY MOUTH ONCE DAILY  . losartan (COZAAR) 25 MG tablet TAKE 1 TABLET BY MOUTH ONCE DAILY FOR BLOOD PRESSURE  . losartan-hydrochlorothiazide (HYZAAR) 100-25 MG tablet Take 1 tablet by mouth daily.  . metFORMIN (GLUCOPHAGE-XR) 500 MG 24 hr tablet Take 1 tablet (500 mg total) by mouth daily with breakfast.  . omeprazole (PRILOSEC) 20 MG capsule TAKE 1 CAPSULE BY MOUTH ONCE DAILY  . potassium chloride SA (K-DUR,KLOR-CON) 20 MEQ tablet Take 1 tablet (20 mEq total) by mouth daily. (Patient taking differently: Take 20 mEq by mouth as needed. )  . [DISCONTINUED] amoxicillin-clavulanate (AUGMENTIN) 875-125 MG tablet Take 1 tablet by mouth 2 (two) times daily.  . [DISCONTINUED] phenazopyridine (PYRIDIUM) 200 MG tablet Take 1 tablet (200 mg total) by mouth 3 (three) times daily as needed for pain. (Patient not taking: Reported on 10/02/2017)  . meloxicam (MOBIC) 7.5 MG tablet Take 1 tablet (7.5 mg total) by mouth daily.  . [DISCONTINUED] metFORMIN (GLUCOPHAGE) 500 MG tablet Take by mouth daily.  . [DISCONTINUED] nitrofurantoin, macrocrystal-monohydrate, (MACROBID) 100 MG capsule Take 1 capsule (100 mg total) by mouth 2 (two) times daily. (Patient not taking: Reported on 10/02/2017)   No facility-administered encounter medications on file as of 09/19/2017.     Surgical History: Past Surgical History:  Procedure Laterality Date  . BREAST BIOPSY Right 04-05-13  positive  . BREAST SURGERY Right 05/20/13   mastectomy  . CESAREAN SECTION  1991  . MASTECTOMY Right 2015   chemo and radiation    Medical History: Past Medical History:  Diagnosis Date  . Arthritis   . Breast cancer  ([place]) 2015   chemo and radiation  . Cancer ([place]) 05-20-13   T1 N1 ER positive/PR negative HER2 negative. right breast  . Diabetes mellitus without complication ([place])   . GERD (gastroesophageal reflux disease)   . Hypertension   . PE (pulmonary embolism)   . Renal calculi     Family History: Family History  Problem Relation Age of Onset  . Lung cancer Father   . Breast cancer Maternal Grandmother 68    Social History   Socioeconomic History  . Marital status: Married    Spouse name: Not on file  . Number of children: Not on file  . Years of education: Not on file  . Highest education level: Not on file  Occupational History  . Not on file  Social Needs  . Financial resource strain: Not on file  . Food insecurity:    Worry: Not on file    Inability: Not on file  . Transportation needs:    Medical: Not on file    Non-medical: Not on file  Tobacco Use  . Smoking status: Never Smoker  . Smokeless tobacco: Never Used  Substance and Sexual Activity  . Alcohol use: No  . Drug use: No  . Sexual activity: Yes  Lifestyle  . Physical activity:    Days per week: Not on file    Minutes per session: Not on file  . Stress: Not on file  Relationships  . Social connections:    Talks on phone: Not on file    Gets together: Not on file    Attends religious service: Not on file    Active member of club or organization: Not on file    Attends meetings of clubs or organizations: Not on file    Relationship status: Not on file  . Intimate partner violence:    Fear of current or ex partner: Not on file    Emotionally abused: Not on file    Physically abused: Not on file    Forced sexual activity: Not on file  Other Topics Concern  . Not on file  Social History Narrative  . Not on file      Review of Systems  Constitutional: Negative for activity change, appetite change, chills, fatigue, fever and unexpected weight change.  HENT: Negative for congestion, postnasal drip,  rhinorrhea, sneezing and sore throat.   Eyes: Negative.  Negative for redness.  Respiratory: Negative for cough, chest tightness and shortness of breath.   Cardiovascular: Negative for chest pain and palpitations.  Gastrointestinal: Positive for nausea. Negative for abdominal pain, constipation, diarrhea and vomiting.  Endocrine:       Blood sugars doing well   Genitourinary: Positive for frequency and urgency. Negative for dysuria and flank pain.  Musculoskeletal: Positive for back pain. Negative for arthralgias, joint swelling and neck pain.  Skin: Negative for rash.  Allergic/Immunologic: Negative.   Neurological: Negative for tremors and numbness.  Hematological: Negative for adenopathy. Does not bruise/bleed easily.  Psychiatric/Behavioral: Negative for behavioral problems (Depression), sleep disturbance and suicidal ideas. The patient is not nervous/anxious.    Today's Vitals   09/19/17 1521  BP: (!) 142/77  Pulse: 91  Resp: 16  SpO2: 98%  Weight: 157 lb (  71.2 kg)  Height: 5' 2" (1.575 m)    Physical Exam  Constitutional: She is oriented to person, place, and time. She appears well-developed and well-nourished. No distress.  HENT:  Head: Normocephalic and atraumatic.  Mouth/Throat: Oropharynx is clear and moist. No oropharyngeal exudate.  Eyes: Pupils are equal, round, and reactive to light. Conjunctivae and EOM are normal.  Neck: Normal range of motion. Neck supple. No JVD present. No tracheal deviation present. No thyromegaly present.  Cardiovascular: Normal rate, regular rhythm and normal heart sounds. Exam reveals no gallop and no friction rub.  No murmur heard. Mild tachycardia  Pulmonary/Chest: Effort normal and breath sounds normal. No respiratory distress. She has no wheezes. She has no rales. She exhibits no tenderness.  Abdominal: Soft. Bowel sounds are normal.  Genitourinary:  Genitourinary Comments: Urine sample positive for moderate WBC and trace blood.    Musculoskeletal: Normal range of motion.  Lymphadenopathy:    She has no cervical adenopathy.  Neurological: She is alert and oriented to person, place, and time. No cranial nerve deficit.  Skin: Skin is warm and dry. She is not diaphoretic.  Psychiatric: She has a normal mood and affect. Her behavior is normal. Judgment and thought content normal.  Nursing note and vitals reviewed.  Assessment/Plan: 1. Urinary tract infection without hematuria, site unspecified Urine sample positive for moderate WBC and trace blood. Start macrobid 194m twice daily for 5 days. Send fur culture and sensitivity and adjust antibiotics as indicated.   2. Dysuria Treat for persistent infection. Adjust antibiotics as indicated.  - POCT Urinalysis Dipstick - CULTURE, URINE COMPREHENSIVE  3. Type 2 diabetes mellitus without complication, without long-term current use of insulin ([facility]) - POCT HgB A1C 5.9 today. Continue diabetic medication as prescribed.   General Counseling: Loveah verbalizes understanding of the findings of todays visit and agrees with plan of treatment. I have discussed any further diagnostic evaluation that may be needed or ordered today. We also reviewed her medications today. she has been encouraged to call the office with any questions or concerns that should arise related to todays visit.    Counseling:  This patient was seen by HLeretha Pol FNP- C in Collaboration with Dr FLavera Guiseas a part of collaborative care agreement   Orders Placed This Encounter  Procedures  . CULTURE, URINE COMPREHENSIVE  . POCT Urinalysis Dipstick  . POCT HgB A1C    Meds ordered this encounter  Medications  . DISCONTD: nitrofurantoin, macrocrystal-monohydrate, (MACROBID) 100 MG capsule    Sig: Take 1 capsule (100 mg total) by mouth 2 (two) times daily.    Dispense:  10 capsule    Refill:  0    Order Specific Question:   Supervising Provider    Answer:   KLavera Guise[[[id number]]   Time  spent: 260Minutes      Dr FLavera GuiseInternal medicine

## 2017-09-22 LAB — CULTURE, URINE COMPREHENSIVE

## 2017-10-02 ENCOUNTER — Other Ambulatory Visit: Payer: Self-pay

## 2017-10-02 ENCOUNTER — Ambulatory Visit: Admission: RE | Admit: 2017-10-02 | Payer: 59 | Source: Ambulatory Visit | Admitting: Radiation Oncology

## 2017-10-02 ENCOUNTER — Inpatient Hospital Stay (HOSPITAL_BASED_OUTPATIENT_CLINIC_OR_DEPARTMENT_OTHER): Payer: 59 | Admitting: Hematology and Oncology

## 2017-10-02 ENCOUNTER — Inpatient Hospital Stay: Payer: 59 | Attending: Hematology and Oncology

## 2017-10-02 ENCOUNTER — Encounter: Payer: Self-pay | Admitting: Hematology and Oncology

## 2017-10-02 VITALS — BP 148/81 | HR 81 | Temp 98.3°F | Resp 18 | Ht 62.0 in | Wt 157.0 lb

## 2017-10-02 DIAGNOSIS — Z801 Family history of malignant neoplasm of trachea, bronchus and lung: Secondary | ICD-10-CM | POA: Diagnosis not present

## 2017-10-02 DIAGNOSIS — Z86711 Personal history of pulmonary embolism: Secondary | ICD-10-CM | POA: Diagnosis not present

## 2017-10-02 DIAGNOSIS — C50911 Malignant neoplasm of unspecified site of right female breast: Secondary | ICD-10-CM

## 2017-10-02 DIAGNOSIS — Z923 Personal history of irradiation: Secondary | ICD-10-CM | POA: Diagnosis not present

## 2017-10-02 DIAGNOSIS — Z79811 Long term (current) use of aromatase inhibitors: Secondary | ICD-10-CM | POA: Insufficient documentation

## 2017-10-02 DIAGNOSIS — Z7984 Long term (current) use of oral hypoglycemic drugs: Secondary | ICD-10-CM

## 2017-10-02 DIAGNOSIS — Z8673 Personal history of transient ischemic attack (TIA), and cerebral infarction without residual deficits: Secondary | ICD-10-CM | POA: Diagnosis not present

## 2017-10-02 DIAGNOSIS — Z79899 Other long term (current) drug therapy: Secondary | ICD-10-CM

## 2017-10-02 DIAGNOSIS — M129 Arthropathy, unspecified: Secondary | ICD-10-CM | POA: Insufficient documentation

## 2017-10-02 DIAGNOSIS — Z87442 Personal history of urinary calculi: Secondary | ICD-10-CM | POA: Insufficient documentation

## 2017-10-02 DIAGNOSIS — Z9221 Personal history of antineoplastic chemotherapy: Secondary | ICD-10-CM

## 2017-10-02 DIAGNOSIS — Z9011 Acquired absence of right breast and nipple: Secondary | ICD-10-CM | POA: Diagnosis not present

## 2017-10-02 DIAGNOSIS — Z803 Family history of malignant neoplasm of breast: Secondary | ICD-10-CM

## 2017-10-02 DIAGNOSIS — Z170 Estrogen receptor positive status [ER+]: Secondary | ICD-10-CM | POA: Diagnosis not present

## 2017-10-02 DIAGNOSIS — E119 Type 2 diabetes mellitus without complications: Secondary | ICD-10-CM

## 2017-10-02 DIAGNOSIS — K219 Gastro-esophageal reflux disease without esophagitis: Secondary | ICD-10-CM

## 2017-10-02 DIAGNOSIS — M85852 Other specified disorders of bone density and structure, left thigh: Secondary | ICD-10-CM

## 2017-10-02 DIAGNOSIS — Z853 Personal history of malignant neoplasm of breast: Secondary | ICD-10-CM

## 2017-10-02 DIAGNOSIS — I10 Essential (primary) hypertension: Secondary | ICD-10-CM | POA: Insufficient documentation

## 2017-10-02 LAB — CBC WITH DIFFERENTIAL/PLATELET
Basophils Absolute: 0 10*3/uL (ref 0–0.1)
Basophils Relative: 1 %
Eosinophils Absolute: 0 10*3/uL (ref 0–0.7)
Eosinophils Relative: 1 %
HCT: 40.9 % (ref 35.0–47.0)
Hemoglobin: 14.2 g/dL (ref 12.0–16.0)
Lymphocytes Relative: 45 %
Lymphs Abs: 1.7 10*3/uL (ref 1.0–3.6)
MCH: 30.2 pg (ref 26.0–34.0)
MCHC: 34.6 g/dL (ref 32.0–36.0)
MCV: 87.5 fL (ref 80.0–100.0)
Monocytes Absolute: 0.4 10*3/uL (ref 0.2–0.9)
Monocytes Relative: 10 %
Neutro Abs: 1.7 10*3/uL (ref 1.4–6.5)
Neutrophils Relative %: 43 %
Platelets: 194 10*3/uL (ref 150–440)
RBC: 4.68 MIL/uL (ref 3.80–5.20)
RDW: 13.9 % (ref 11.5–14.5)
WBC: 3.9 10*3/uL (ref 3.6–11.0)

## 2017-10-02 LAB — COMPREHENSIVE METABOLIC PANEL
ALT: 30 U/L (ref 14–54)
AST: 29 U/L (ref 15–41)
Albumin: 4.1 g/dL (ref 3.5–5.0)
Alkaline Phosphatase: 67 U/L (ref 38–126)
Anion gap: 11 (ref 5–15)
BUN: 12 mg/dL (ref 6–20)
CO2: 23 mmol/L (ref 22–32)
Calcium: 9.7 mg/dL (ref 8.9–10.3)
Chloride: 106 mmol/L (ref 101–111)
Creatinine, Ser: 0.85 mg/dL (ref 0.44–1.00)
GFR calc Af Amer: >60 mL/min (ref >60)
GFR calc non Af Amer: >60 mL/min (ref >60)
Glucose, Bld: 102 mg/dL — ABNORMAL HIGH (ref 65–99)
Potassium: 3.7 mmol/L (ref 3.5–5.1)
Sodium: 140 mmol/L (ref 135–145)
Total Bilirubin: 0.9 mg/dL (ref 0.3–1.2)
Total Protein: 7.9 g/dL (ref 6.5–8.1)

## 2017-10-02 NOTE — Progress Notes (Signed)
[facility] day:  10/02/17   Chief Complaint: Abigail Wheeler is an 67 y.o. female history of stage IIIB right breast cancer who is seen for 6 month assessment on Femara.  HPI: The patient was last seen in the medical oncology clinic on 02/25/2017.  At that time, she was doing well on Femara.  Exam was stable.  Labs were unremarkable.   Left mammogram on 04/21/2017 revealed no evidence of malignancy.  During the interim, patient is doing well today, and does not express any acute concerns. She notes that she had some issues with her toes. She was diagnosed with arthritis.   Patient does not verbalize any concerns with regards to her breasts today. Patient  does perform monthly self breast examinations as recommended. Patient continues on her Femara as prescribed with no perceived side effects. Patient denies B symptoms. She has not experienced any significant interval infections. Patient notes that she is eating well. Weight has increased by 2 pounds.   Patient denies pain in the clinic today.    Past Medical History:  Diagnosis Date  . Arthritis   . Breast cancer ([place]) 2015   chemo and radiation  . Cancer ([place]) 05-20-13   T1 N1 ER positive/PR negative HER2 negative. right breast  . Diabetes mellitus without complication ([place])   . GERD (gastroesophageal reflux disease)   . Hypertension   . PE (pulmonary embolism)   . Renal calculi    Past Surgical History:  Procedure Laterality Date  . BREAST BIOPSY Right 04-05-13   positive  . BREAST SURGERY Right 05/20/13   mastectomy  . CESAREAN SECTION  1991  . MASTECTOMY Right 2015   chemo and radiation   Family History  Problem Relation Age of Onset  . Lung cancer Father   . Breast cancer Maternal Grandmother 68   Social History:  reports that she has never smoked. She has never used smokeless tobacco. She reports that she does not drink alcohol or use drugs.  The patient lives in [place].  She is accompanied by her husband, Richard.  Allergies: No Known Allergies  Current Medications: Current Outpatient Medications  Medication Sig Dispense Refill  . acetaminophen (TYLENOL) 500 MG tablet Take 500 mg by mouth every 6 (six) hours as needed.    . calcium carbonate (OS-CAL) 600 MG TABS tablet Take 600 mg by mouth daily.    . Cholecalciferol (VITAMIN D-3) 1000 UNITS CAPS Take 1 capsule by mouth daily.    Marland Kitchen letrozole (FEMARA) 2.5 MG tablet TAKE 1 TABLET BY MOUTH ONCE DAILY 30 tablet 12  . losartan (COZAAR) 25 MG tablet TAKE 1 TABLET BY MOUTH ONCE DAILY FOR BLOOD PRESSURE 30 tablet 3  . losartan-hydrochlorothiazide (HYZAAR) 100-25 MG tablet Take 1 tablet by mouth daily.    . meloxicam (MOBIC) 7.5 MG tablet Take 1 tablet (7.5 mg total) by mouth daily. 30 tablet 1  . metFORMIN (GLUCOPHAGE-XR) 500 MG 24 hr tablet Take 1 tablet (500 mg total) by mouth daily with breakfast. 30 tablet 5  . omeprazole (PRILOSEC) 20 MG capsule TAKE 1 CAPSULE BY MOUTH ONCE DAILY 30 capsule 12  . potassium chloride SA (K-DUR,KLOR-CON) 20 MEQ tablet Take 1 tablet (20 mEq total) by mouth daily. (Patient taking differently: Take 20 mEq by mouth as needed. ) 3 tablet 0   No current facility-administered medications for this visit.     Review of Systems  Constitutional: Negative for diaphoresis, fever, malaise/fatigue and  weight loss (weight up 2 pounds).  HENT: Negative.   Eyes: Negative.   Respiratory: Negative for cough, hemoptysis, sputum production and shortness of breath.   Cardiovascular: Negative for chest pain, palpitations, orthopnea, leg swelling and PND.  Gastrointestinal: Negative for abdominal pain, blood in stool, constipation, diarrhea, melena, nausea and vomiting.  Genitourinary: Negative for dysuria, frequency, hematuria and urgency.  Musculoskeletal: Positive for joint pain (arthritic pain in toes). Negative for back pain, falls and myalgias.  Skin: Negative for itching and rash.   Neurological: Negative for dizziness, tremors, weakness and headaches.  Endo/Heme/Allergies: Does not bruise/bleed easily.  Psychiatric/Behavioral: Negative for depression, memory loss and suicidal ideas. The patient is not nervous/anxious and does not have insomnia.   All other systems reviewed and are negative.  Performance status (ECOG): 0 - Asymptomatic  Physical Exam: Blood pressure (!) 148/81, pulse 81, temperature 98.3 F (36.8 C), temperature source Tympanic, resp. rate 18, height '5\' 2"'$  (1.575 m), weight 157 lb (71.2 kg). GENERAL:  Well developed, well nourished, woman sitting comfortably in the exam room in no acute distress. MENTAL STATUS:  Alert and oriented to person, place and time. HEAD:  Dark curly hair.  Normocephalic, atraumatic, face symmetric, no Cushingoid features. EYES:  Glasses.  Brown eyes.  Arcus senilis.  Pupils equal round and reactive to light and accomodation.  No conjunctivitis or scleral icterus. ENT:  Oropharynx clear without lesion.  Torus.  Tongue normal. Mucous membranes moist.  RESPIRATORY:  Clear to auscultation without rales, wheezes or rhonchi. CARDIOVASCULAR:  Regular rate and rhythm without murmur, rub or gallop. BREAST:  Right mastectomy.  No erythema or nodularity.  Left breast with fibrocystic changes superiorly.  No discrete masses, skin changes or nipple discharge. Nipple inverted. ABDOMEN:  Soft, non-tender, with active bowel sounds, and no hepatosplenomegaly.  No masses. SKIN:  No rashes, ulcers or lesions. EXTREMITIES: No edema, no skin discoloration or tenderness.  No palpable cords. LYMPH NODES: No palpable cervical, supraclavicular, axillary or inguinal adenopathy  NEUROLOGICAL: Unremarkable. PSYCH:  Appropriate.    Orders Only on 10/02/2017  Component Date Value Ref Range Status  . WBC 10/02/2017 3.9  3.6 - 11.0 K/uL Final  . RBC 10/02/2017 4.68  3.80 - 5.20 MIL/uL Final  . Hemoglobin 10/02/2017 14.2  12.0 - 16.0 g/dL Final  . HCT  10/02/2017 40.9  35.0 - 47.0 % Final  . MCV 10/02/2017 87.5  80.0 - 100.0 fL Final  . MCH 10/02/2017 30.2  26.0 - 34.0 pg Final  . MCHC 10/02/2017 34.6  32.0 - 36.0 g/dL Final  . RDW 10/02/2017 13.9  11.5 - 14.5 % Final  . Platelets 10/02/2017 194  150 - 440 K/uL Final  . Neutrophils Relative % 10/02/2017 43  % Final  . Neutro Abs 10/02/2017 1.7  1.4 - 6.5 K/uL Final  . Lymphocytes Relative 10/02/2017 45  % Final  . Lymphs Abs 10/02/2017 1.7  1.0 - 3.6 K/uL Final  . Monocytes Relative 10/02/2017 10  % Final  . Monocytes Absolute 10/02/2017 0.4  0.2 - 0.9 K/uL Final  . Eosinophils Relative 10/02/2017 1  % Final  . Eosinophils Absolute 10/02/2017 0.0  0 - 0.7 K/uL Final  . Basophils Relative 10/02/2017 1  % Final  . Basophils Absolute 10/02/2017 0.0  0 - 0.1 K/uL Final   Performed at [facility], [address], [place], [place] [address]  . Sodium 10/02/2017 140  135 - 145 mmol/L Final  . Potassium 10/02/2017 3.7  3.5 -  5.1 mmol/L Final  . Chloride 10/02/2017 106  101 - 111 mmol/L Final  . CO2 10/02/2017 23  22 - 32 mmol/L Final  . Glucose, Bld 10/02/2017 102* 65 - 99 mg/dL Final  . BUN 10/02/2017 12  6 - 20 mg/dL Final  . Creatinine, Ser 10/02/2017 0.85  0.44 - 1.00 mg/dL Final  . Calcium 10/02/2017 9.7  8.9 - 10.3 mg/dL Final  . Total Protein 10/02/2017 7.9  6.5 - 8.1 g/dL Final  . Albumin 10/02/2017 4.1  3.5 - 5.0 g/dL Final  . AST 10/02/2017 29  15 - 41 U/L Final  . ALT 10/02/2017 30  14 - 54 U/L Final  . Alkaline Phosphatase 10/02/2017 67  38 - 126 U/L Final  . Total Bilirubin 10/02/2017 0.9  0.3 - 1.2 mg/dL Final  . GFR calc non Af Amer 10/02/2017 >60  >60 mL/min Final  . GFR calc Af Amer 10/02/2017 >60  >60 mL/min Final   Comment: (NOTE) The eGFR has been calculated using the CKD EPI equation. This calculation has not been validated in all clinical situations. eGFR's persistently <60 mL/min signify possible Chronic Kidney Disease.   Georgiann Hahn gap 10/02/2017 11  5 -  15 Final   Performed at [facility], [address], [place], [place] [address]   Assessment:  Abigail Wheeler is a 67 y.o. African American female with stage IIIB multifocal right breast cancer status post mastectomy and sentinel lymph node biopsy on 05/19/2013.  Pathology revealed a grade I lobular carcinoma.  The largest focus was 1.7 cm and invaded the pectoralis muscle.  Additional foci were 1.4 cm and 1 mm (two).  Sentinel lymph node revealed a 1.7 mm focus without extracapsular extension.  Tumor was ER positive, PR negative, and HER-2/neu  negative.  She received 4 cycles of Adriamycin and Cytoxan (06/18/2013 - 08/19/2013) followed by 12 weeks of Taxol (09/09/2013 - 11/25/2013).  She received radiation to the chest wall and right axillae from 12/28/2013 - 02/18/2014.  Following completion of radiation, she began Femara.  Left sided mammogram on 04/21/2017 revealed no evidence of malignancy.    CA27.29 has been followed: 11.7 on 05/12/2013, 3.8 on 12/30/2013, 23.6 on 09/09/2014, 19 on 01/10/2015, 13.5 on 05/12/2015, 11.8 on 09/26/2015, 10.9 on 01/30/2016, 11.7 on 08/20/2016, and 11.5 on 02/25/2017.  Bone density study on 03/22/2014 revealed osteopenia with a T score of -1.6 in the right femur neck and -1.3 in L1-L4 spine.  Bone density study on 08/27/2016 revealed osteopenia with a T-score of -1.9 in the left femoral neck and -1.2 in the AP spine L1-L4.  She is on calcium and vitamin D.  She was diagnosed with multiple left lower pulmonary emboli on 08/08/2013.  Hypercoagulable work-up on 01/10/2015 was normal (Factor V Leiden, prothrombin gene mutation, lupus anticoagulant panel, anticardiolipin antibodies, protein C antigen/activity, and protein S antigen/activity).  Chest CT angiogram on 05/21/2016 revealed no evidence of pulmonary embolism.  She is on Xarelto.   Symptomatically, she is doing well on Femara.  Exam is stable.  Labs are unremarkable.   Plan: 1. Labs today:  CBC  with diff, CMP, CA27.29. 2. Review interval mammogram- no evidence of malignancy. 3. Discuss osteopenia. Reviewed the use of Prolia to prevent further bone loss/thinnning associated with AI therapy. Patient states, "I don't think I am interested".  4. Continue Femara as previously prescribed.  5. Discuss BCI (breast cancer index) testing on the patient's original tumor that is stored in the pathology lab. Testing  will be used to assess the benefit of extended (5 versus 10 years) of adjuvant hormonal therapy. Patient provided with written information on the testing today, and was encouraged to contact their insurance company to inquire about coverage and out of pocket costs.  6. Schedule mammogram for 04/21/2018- already ordered. 7. RTC in 6 months for MD assessment and labs (CBC with diff, CMP, CA27.29).    Honor Loh, NP  10/02/2017, 10:16 AM   I saw and evaluated the patient, participating in the key portions of the service and reviewing pertinent diagnostic studies and records.  I reviewed the nurse practitioner's note and agree with the findings and the plan.  The assessment and plan were discussed with the patient.  Several questions were asked by the patient and answered.   Nolon Stalls, MD 10/02/2017, 10:16 AM

## 2017-10-02 NOTE — Progress Notes (Signed)
Pt in for 6 month follow up, husband is with patient.  Pt reports just finishing treatment regime for UTI.  Pt verbalized no concerns today.

## 2017-10-03 LAB — CA 27.29 (SERIAL MONITOR): CA 27.29: 11.3 U/mL (ref 0.0–38.6)

## 2017-11-18 ENCOUNTER — Other Ambulatory Visit: Payer: Self-pay

## 2017-11-18 ENCOUNTER — Other Ambulatory Visit: Payer: Self-pay | Admitting: Podiatry

## 2017-11-18 MED ORDER — LOSARTAN POTASSIUM 25 MG PO TABS: ORAL_TABLET | ORAL | 3 refills | Status: DC

## 2017-12-18 ENCOUNTER — Ambulatory Visit: Payer: Medicare Other | Admitting: Podiatry

## 2018-01-19 ENCOUNTER — Ambulatory Visit: Payer: Self-pay | Admitting: Nurse Practitioner

## 2018-02-16 ENCOUNTER — Other Ambulatory Visit: Payer: Self-pay | Admitting: Nurse Practitioner

## 2018-02-16 DIAGNOSIS — Z794 Long term (current) use of insulin: Principal | ICD-10-CM

## 2018-02-16 DIAGNOSIS — E119 Type 2 diabetes mellitus without complications: Secondary | ICD-10-CM

## 2018-03-16 ENCOUNTER — Telehealth: Payer: Self-pay | Admitting: Internal Medicine

## 2018-03-16 NOTE — Telephone Encounter (Signed)
CALLED PATIENT REGARDING DIABETIC EYE EXAM AND SHE TOLD ME THAT SHE HAD AN EYE EXAM IN April OR MAY AND THAT THE EYE DR. Rosanna Randy TO DO FUTHER TESTING AND SHE WAS NOT GOING TO HAVE IT DONE.  I THEN ASK IF I COULD SCHEDULE AND F/U FOR HER A1C THAT SHE CANCELLED IN SEPT. AND SHE TOLD ME THAT SHE WOULD CALL BACK TO SCHEDULE LATER.JW

## 2018-03-17 ENCOUNTER — Telehealth: Payer: Self-pay | Admitting: *Deleted

## 2018-03-17 NOTE — Telephone Encounter (Signed)
Called pt in regards to her scheduled 04/27/2018 MD appt. Patient stated that she wanted to continue to be seen in [place] But wasn't sure about which Provider she wanted to see. She stated that She would call back on 03/18/18 to let us know.

## 2018-03-18 ENCOUNTER — Other Ambulatory Visit: Payer: Self-pay

## 2018-03-18 MED ORDER — LOSARTAN POTASSIUM 25 MG PO TABS: ORAL_TABLET | ORAL | 3 refills | Status: DC

## 2018-04-23 ENCOUNTER — Ambulatory Visit
Admission: RE | Admit: 2018-04-23 | Discharge: 2018-04-23 | Disposition: A | Discharge status: Home / Self Care | Payer: 59 | Source: Ambulatory Visit | Attending: Urgent Care | Admitting: Urgent Care

## 2018-04-23 DIAGNOSIS — C50911 Malignant neoplasm of unspecified site of right female breast: Secondary | ICD-10-CM | POA: Diagnosis not present

## 2018-04-23 DIAGNOSIS — Z1231 Encounter for screening mammogram for malignant neoplasm of breast: Secondary | ICD-10-CM | POA: Diagnosis not present

## 2018-04-23 HISTORY — DX: Personal history of irradiation: Z92.3

## 2018-04-23 HISTORY — DX: Personal history of antineoplastic chemotherapy: Z92.21

## 2018-04-27 ENCOUNTER — Ambulatory Visit: Payer: 59 | Admitting: Hematology and Oncology

## 2018-04-30 ENCOUNTER — Ambulatory Visit: Payer: Medicare Other | Admitting: General Surgery

## 2018-04-30 ENCOUNTER — Other Ambulatory Visit: Payer: Self-pay

## 2018-04-30 DIAGNOSIS — Z853 Personal history of malignant neoplasm of breast: Secondary | ICD-10-CM

## 2018-04-30 DIAGNOSIS — Z170 Estrogen receptor positive status [ER+]: Principal | ICD-10-CM

## 2018-04-30 DIAGNOSIS — C50911 Malignant neoplasm of unspecified site of right female breast: Secondary | ICD-10-CM

## 2018-05-05 ENCOUNTER — Inpatient Hospital Stay (HOSPITAL_BASED_OUTPATIENT_CLINIC_OR_DEPARTMENT_OTHER): Payer: 59 | Admitting: Oncology

## 2018-05-05 ENCOUNTER — Encounter (INDEPENDENT_AMBULATORY_CARE_PROVIDER_SITE_OTHER): Payer: Self-pay

## 2018-05-05 ENCOUNTER — Inpatient Hospital Stay: Payer: 59 | Attending: Oncology

## 2018-05-05 ENCOUNTER — Encounter: Payer: Self-pay | Admitting: Oncology

## 2018-05-05 VITALS — BP 150/76 | HR 97 | Temp 97.3°F | Resp 20 | Wt 157.5 lb

## 2018-05-05 DIAGNOSIS — Z7984 Long term (current) use of oral hypoglycemic drugs: Secondary | ICD-10-CM | POA: Insufficient documentation

## 2018-05-05 DIAGNOSIS — Z86711 Personal history of pulmonary embolism: Secondary | ICD-10-CM

## 2018-05-05 DIAGNOSIS — K219 Gastro-esophageal reflux disease without esophagitis: Secondary | ICD-10-CM | POA: Insufficient documentation

## 2018-05-05 DIAGNOSIS — E119 Type 2 diabetes mellitus without complications: Secondary | ICD-10-CM | POA: Insufficient documentation

## 2018-05-05 DIAGNOSIS — Z923 Personal history of irradiation: Secondary | ICD-10-CM

## 2018-05-05 DIAGNOSIS — Z08 Encounter for follow-up examination after completed treatment for malignant neoplasm: Secondary | ICD-10-CM

## 2018-05-05 DIAGNOSIS — Z9011 Acquired absence of right breast and nipple: Secondary | ICD-10-CM | POA: Diagnosis not present

## 2018-05-05 DIAGNOSIS — Z170 Estrogen receptor positive status [ER+]: Secondary | ICD-10-CM | POA: Insufficient documentation

## 2018-05-05 DIAGNOSIS — M1990 Unspecified osteoarthritis, unspecified site: Secondary | ICD-10-CM

## 2018-05-05 DIAGNOSIS — Z79899 Other long term (current) drug therapy: Secondary | ICD-10-CM | POA: Insufficient documentation

## 2018-05-05 DIAGNOSIS — M8580 Other specified disorders of bone density and structure, unspecified site: Secondary | ICD-10-CM | POA: Insufficient documentation

## 2018-05-05 DIAGNOSIS — I10 Essential (primary) hypertension: Secondary | ICD-10-CM | POA: Insufficient documentation

## 2018-05-05 DIAGNOSIS — Z9221 Personal history of antineoplastic chemotherapy: Secondary | ICD-10-CM

## 2018-05-05 DIAGNOSIS — Z79811 Long term (current) use of aromatase inhibitors: Secondary | ICD-10-CM

## 2018-05-05 DIAGNOSIS — Z7901 Long term (current) use of anticoagulants: Secondary | ICD-10-CM

## 2018-05-05 DIAGNOSIS — Z853 Personal history of malignant neoplasm of breast: Secondary | ICD-10-CM

## 2018-05-05 DIAGNOSIS — C50911 Malignant neoplasm of unspecified site of right female breast: Secondary | ICD-10-CM

## 2018-05-05 DIAGNOSIS — Z801 Family history of malignant neoplasm of trachea, bronchus and lung: Secondary | ICD-10-CM | POA: Diagnosis not present

## 2018-05-05 DIAGNOSIS — Z803 Family history of malignant neoplasm of breast: Secondary | ICD-10-CM

## 2018-05-05 LAB — COMPREHENSIVE METABOLIC PANEL
ALT: 27 U/L (ref 0–44)
AST: 22 U/L (ref 15–41)
Albumin: 3.9 g/dL (ref 3.5–5.0)
Alkaline Phosphatase: 59 U/L (ref 38–126)
Anion gap: 9 (ref 5–15)
BUN: 12 mg/dL (ref 8–23)
CO2: 29 mmol/L (ref 22–32)
Calcium: 9.6 mg/dL (ref 8.9–10.3)
Chloride: 104 mmol/L (ref 98–111)
Creatinine, Ser: 0.91 mg/dL (ref 0.44–1.00)
GFR calc Af Amer: >60 mL/min (ref >60)
GFR calc non Af Amer: >60 mL/min (ref >60)
Glucose, Bld: 120 mg/dL — ABNORMAL HIGH (ref 70–99)
Potassium: 3.3 mmol/L — ABNORMAL LOW (ref 3.5–5.1)
Sodium: 142 mmol/L (ref 135–145)
Total Bilirubin: 0.8 mg/dL (ref 0.3–1.2)
Total Protein: 7.9 g/dL (ref 6.5–8.1)

## 2018-05-05 LAB — CBC WITH DIFFERENTIAL/PLATELET
Abs Immature Granulocytes: 0 10*3/uL (ref 0.00–0.07)
Basophils Absolute: 0 10*3/uL (ref 0.0–0.1)
Basophils Relative: 0 %
Eosinophils Absolute: 0 10*3/uL (ref 0.0–0.5)
Eosinophils Relative: 1 %
HCT: 38 % (ref 36.0–46.0)
Hemoglobin: 13 g/dL (ref 12.0–15.0)
Immature Granulocytes: 0 %
Lymphocytes Relative: 42 %
Lymphs Abs: 1.9 10*3/uL (ref 0.7–4.0)
MCH: 29.2 pg (ref 26.0–34.0)
MCHC: 34.2 g/dL (ref 30.0–36.0)
MCV: 85.4 fL (ref 80.0–100.0)
Monocytes Absolute: 0.5 10*3/uL (ref 0.1–1.0)
Monocytes Relative: 10 %
Neutro Abs: 2.1 10*3/uL (ref 1.7–7.7)
Neutrophils Relative %: 47 %
Platelets: 208 10*3/uL (ref 150–400)
RBC: 4.45 MIL/uL (ref 3.87–5.11)
RDW: 12.8 % (ref 11.5–15.5)
WBC: 4.5 10*3/uL (ref 4.0–10.5)
nRBC: 0 % (ref 0.0–0.2)

## 2018-05-05 NOTE — Progress Notes (Signed)
Patient doing well. No SE's from femara. Appetite and energy are good. No breast pain.

## 2018-05-05 NOTE — Progress Notes (Signed)
Hematology/Oncology Consult note [facility]  [phone] [phone]  Patient Care Team: Lavera Guise, MD as PCP - General (Internal Medicine) Christene Lye, MD (General Surgery) Lavera Guise, MD (Internal Medicine) Lequita Asal, MD as Referring Physician (Hematology and Oncology)   Name of the patient: Abigail Wheeler  [id number]  06-17-1950   Date of visit: 05/05/18  Diagnosis-history of stage IIIb right breast cancer currently on Femara  Chief complaint/ Reason for visit-routine follow-up of breast cancer on Femara  Heme/Onc history: Patient is a 68 year old female who sees Dr. Mike Gip for her history of stage IIIb right breast cancer which was diagnosed in January 2015.  She is status post right mastectomy and sentinel lymph node biopsy.  Pathology showed grade 1 lobular carcinoma multifocal, largest focus was 1.7 cm invading the pectoralis muscle additional foci were 1.4 cm and 1 mm respectively.  Sentinel lymph node was also positive with extracapsular extension.  Tumor was ER PR positive and HER-2/neu negative.  Patient received dose dense AC followed by 12 weeks of Taxol.  She also received adjuvant radiation to her chest and axilla.  She then started taking Femara.  Patient also diagnosed with multiple left lower lobe pulmonary emboli in April 2015.  Hypercoagulable work-up was negative and patient is on Xarelto.  Bone density scan in May 2018 showed osteopenia with a score of -1.9.  Patient was not a candidate for FRAX score due to Femara.  Interval history- she is tolerating her femara well and reports no significant side effects. She has mild chronic joint pain that is essentially stable  ECOG PS-1 Pain scale- 4  Review of systems- Review of Systems  Constitutional: Positive for malaise/fatigue.  Musculoskeletal: Positive for joint pain.       No Known Allergies   Past Medical History:  Diagnosis Date    . Arthritis   . Breast cancer ([place]) 2015   chemo and radiation  . Cancer ([place]) 05-20-13   T1 N1 ER positive/PR negative HER2 negative. right breast  . Diabetes mellitus without complication ([place])   . GERD (gastroesophageal reflux disease)   . Hypertension   . PE (pulmonary embolism)   . Personal history of chemotherapy 2015   Right breast  . Personal history of radiation therapy 2015   Right breast  . Renal calculi      Past Surgical History:  Procedure Laterality Date  . BREAST BIOPSY Right 04-05-13   positive  . BREAST SURGERY Right 05/20/13   mastectomy  . CESAREAN SECTION  1991  . MASTECTOMY Right 2015   chemo and radiation    Social History   Socioeconomic History  . Marital status: Married    Spouse name: Not on file  . Number of children: Not on file  . Years of education: Not on file  . Highest education level: Not on file  Occupational History  . Not on file  Social Needs  . Financial resource strain: Not on file  . Food insecurity:    Worry: Not on file    Inability: Not on file  . Transportation needs:    Medical: Not on file    Non-medical: Not on file  Tobacco Use  . Smoking status: Never Smoker  . Smokeless tobacco: Never Used  Substance and Sexual Activity  . Alcohol use: No  . Drug use: No  . Sexual activity: Yes  Lifestyle  . Physical activity:    Days per week:  Not on file    Minutes per session: Not on file  . Stress: Not on file  Relationships  . Social connections:    Talks on phone: Not on file    Gets together: Not on file    Attends religious service: Not on file    Active member of club or organization: Not on file    Attends meetings of clubs or organizations: Not on file    Relationship status: Not on file  . Intimate partner violence:    Fear of current or ex partner: Not on file    Emotionally abused: Not on file    Physically abused: Not on file    Forced sexual activity: Not on file  Other Topics Concern  . Not on  file  Social History Narrative  . Not on file    Family History  Problem Relation Age of Onset  . Lung cancer Father   . Breast cancer Maternal Grandmother 56     Current Outpatient Medications:  .  acetaminophen (TYLENOL) 500 MG tablet, Take 500 mg by mouth every 6 (six) hours as needed., Disp: , Rfl:  .  calcium carbonate (OS-CAL) 600 MG TABS tablet, Take 600 mg by mouth daily., Disp: , Rfl:  .  Cholecalciferol (VITAMIN D-3) 1000 UNITS CAPS, Take 1 capsule by mouth daily., Disp: , Rfl:  .  letrozole (FEMARA) 2.5 MG tablet, TAKE 1 TABLET BY MOUTH ONCE DAILY, Disp: 30 tablet, Rfl: 12 .  losartan-hydrochlorothiazide (HYZAAR) 100-25 MG tablet, Take 1 tablet by mouth daily., Disp: , Rfl:  .  meloxicam (MOBIC) 7.5 MG tablet, TAKE 1 TABLET (7.5 MG TOTAL) BY MOUTH DAILY., Disp: 30 tablet, Rfl: 1 .  metFORMIN (GLUCOPHAGE-XR) 500 MG 24 hr tablet, TAKE 1 TABLET BY MOUTH DAILY WITH BREAKFAST., Disp: 30 tablet, Rfl: 5 .  omeprazole (PRILOSEC) 20 MG capsule, TAKE 1 CAPSULE BY MOUTH ONCE DAILY, Disp: 30 capsule, Rfl: 12 .  potassium chloride SA (K-DUR,KLOR-CON) 20 MEQ tablet, Take 1 tablet (20 mEq total) by mouth daily. (Patient not taking: Reported on 05/05/2018), Disp: 3 tablet, Rfl: 0  Physical exam:  Vitals:   05/05/18 1024  BP: (!) 150/76  Pulse: 97  Resp: 20  Temp: (!) 97.3 F (36.3 C)  TempSrc: Tympanic  Weight: 157 lb 8 oz (71.4 kg)   Physical Exam HENT:     Head: Normocephalic and atraumatic.  Eyes:     Pupils: Pupils are equal, round, and reactive to light.  Neck:     Musculoskeletal: Normal range of motion.  Cardiovascular:     Rate and Rhythm: Normal rate and regular rhythm.     Heart sounds: Normal heart sounds.  Pulmonary:     Effort: Pulmonary effort is normal.     Breath sounds: Normal breath sounds.  Abdominal:     General: Bowel sounds are normal.     Palpations: Abdomen is soft.  Skin:    General: Skin is warm and dry.  Neurological:     Mental Status: She is  alert and oriented to person, place, and time.      CMP Latest Ref Rng & Units 05/05/2018  Glucose 70 - 99 mg/dL 120(H)  BUN 8 - 23 mg/dL 12  Creatinine 0.44 - 1.00 mg/dL 0.91  Sodium 135 - 145 mmol/L 142  Potassium 3.5 - 5.1 mmol/L 3.3(L)  Chloride 98 - 111 mmol/L 104  CO2 22 - 32 mmol/L 29  Calcium 8.9 - 10.3 mg/dL 9.6  Total Protein  6.5 - 8.1 g/dL 7.9  Total Bilirubin 0.3 - 1.2 mg/dL 0.8  Alkaline Phos 38 - 126 U/L 59  AST 15 - 41 U/L 22  ALT 0 - 44 U/L 27   CBC Latest Ref Rng & Units 05/05/2018  WBC 4.0 - 10.5 K/uL 4.5  Hemoglobin 12.0 - 15.0 g/dL 13.0  Hematocrit 36.0 - 46.0 % 38.0  Platelets 150 - 400 K/uL 208    No images are attached to the encounter.  Mm 3d Screen Breast Uni Left  Result Date: 04/23/2018 CLINICAL DATA:  Screening. EXAM: DIGITAL SCREENING UNILATERAL LEFT MAMMOGRAM WITH CAD AND TOMO COMPARISON:  Previous exam(s). ACR Breast Density Category b: There are scattered areas of fibroglandular density. FINDINGS: The patient has had a right mastectomy. There are no findings suspicious for malignancy. Images were processed with CAD. IMPRESSION: No mammographic evidence of malignancy. A result letter of this screening mammogram will be mailed directly to the patient. RECOMMENDATION: Screening mammogram in one year.  (Code:SM-L-17M) BI-RADS CATEGORY  1: Negative. Electronically Signed   By: Ammie Ferrier M.D.   On: 04/23/2018 14:45     Assessment and plan- Patient is a 68 y.o. female with history of stage III B right breast cancer.  Postmastectomy adjuvant chemotherapy and radiation currently on Femara  Patient is tolerating her femara well and with Stage III breast cancer I would recommend doing it for 10 years instead of stopping it at 5 years. If patient wishes to stop it at 5 years we could do a BCI test to see if continued femara would be beneficial.  It is unclear if BCI test was performed  We will look into that today  Patient does have some baseline  osteopenia and we will be repeating her bone density scan in May 2020.  Patient states that she did obtain dental clearance for bisphosphonates.we could do zoemta 5 mg once every 2 years for osteoporosis prevention if patient does not wish to take it Q6 months  She will continue xarelto for her PE  Mammogram from January 2020.  No evidence of malignancy in her left breast.  I will see her back in 6 months time with CBC CMP and CA-27-29   Visit Diagnosis 1. Encounter for follow-up surveillance of breast cancer   2. Osteopenia, unspecified location      Dr. Randa Evens, MD, MPH [facility] at [facility] [phone] 05/05/2018 1:04 PM

## 2018-05-06 LAB — CANCER ANTIGEN 27.29: CA 27.29: 10 U/mL (ref 0.0–38.6)

## 2018-05-12 ENCOUNTER — Telehealth: Payer: Self-pay

## 2018-05-12 NOTE — Telephone Encounter (Signed)
Spoke with Dentist, Lennox Pippins, with [facility] regarding dental clearance for Zometa. She states patient currently has periodontal disease and was scheduled to get a deep root cleaning 1 year ago and this has not been completed. Reports she will need to discuss with colleagues regarding clearance for Zometa since patient has not been seen since 08/2017 and will call back regarding her decision.

## 2018-05-15 ENCOUNTER — Telehealth: Payer: Self-pay | Admitting: *Deleted

## 2018-05-15 NOTE — Telephone Encounter (Signed)
Called and spoke to Abigail Wheeler in regards to her dental clearance.  I told her had gotten in touch with the [place] office and they had let me know that it has been a while since she had been in clinic.  She would need to go back to them to be seen and then they could determine whether or not after their  Examination patient may need something done to her teeth before they give [place] permisiointo start  Zometa.  Patient states that she has not been there in a while and she will try calling their office next week and getting set up for an appointment.

## 2018-05-26 ENCOUNTER — Other Ambulatory Visit: Payer: Self-pay

## 2018-05-26 ENCOUNTER — Ambulatory Visit (INDEPENDENT_AMBULATORY_CARE_PROVIDER_SITE_OTHER): Payer: 59 | Admitting: General Surgery

## 2018-05-26 ENCOUNTER — Ambulatory Visit: Payer: Medicare Other | Admitting: General Surgery

## 2018-05-26 ENCOUNTER — Encounter: Payer: Self-pay | Admitting: General Surgery

## 2018-05-26 VITALS — BP 153/107 | HR 99 | Temp 97.2°F | Ht 60.0 in | Wt 157.0 lb

## 2018-05-26 DIAGNOSIS — Z853 Personal history of malignant neoplasm of breast: Secondary | ICD-10-CM | POA: Diagnosis not present

## 2018-05-26 NOTE — Patient Instructions (Signed)
Patient will be asked to return to the office in one year with a left breast  screening mammogram. The patient is aware to call back for any questions or concerns.

## 2018-05-26 NOTE — Progress Notes (Signed)
Patient ID: Abigail Wheeler, female   DOB: Oct 21, 1950, 68 y.o.   MRN: [id number]  Chief Complaint  Patient presents with  . Follow-up    mammogram     HPI Abigail Wheeler is a 68 y.o. female who presents for a breast evaluation. The most recent mammogram was done on  04/23/2018.  Patient does perform regular self breast checks and gets regular mammograms done.    HPI  Past Medical History:  Diagnosis Date  . Arthritis   . Breast cancer ([place]) 2015   chemo and radiation  . Cancer ([facility]) 05-20-13   T1 N1 ER positive/PR negative HER2 negative. right breast  . Diabetes mellitus without complication ([place])   . GERD (gastroesophageal reflux disease)   . Hypertension   . PE (pulmonary embolism)   . Personal history of chemotherapy 2015   Right breast  . Personal history of radiation therapy 2015   Right breast  . Renal calculi     Past Surgical History:  Procedure Laterality Date  . BREAST BIOPSY Right 04-05-13   positive  . BREAST SURGERY Right 05/20/13   mastectomy  . CESAREAN SECTION  1991  . MASTECTOMY Right 2015   chemo and radiation    Family History  Problem Relation Age of Onset  . Lung cancer Father   . Breast cancer Maternal Grandmother 68    Social History Social History   Tobacco Use  . Smoking status: Never Smoker  . Smokeless tobacco: Never Used  Substance Use Topics  . Alcohol use: No  . Drug use: No    No Known Allergies  Current Outpatient Medications  Medication Sig Dispense Refill  . acetaminophen (TYLENOL) 500 MG tablet Take 500 mg by mouth every 6 (six) hours as needed.    . calcium carbonate (OS-CAL) 600 MG TABS tablet Take 600 mg by mouth daily.    . Cholecalciferol (VITAMIN D-3) 1000 UNITS CAPS Take 1 capsule by mouth daily.    Marland Kitchen letrozole (FEMARA) 2.5 MG tablet TAKE 1 TABLET BY MOUTH ONCE DAILY 30 tablet 12  . losartan-hydrochlorothiazide (HYZAAR) 100-25 MG tablet Take 1 tablet by mouth daily.    . metFORMIN (GLUCOPHAGE-XR) 500 MG 24  hr tablet TAKE 1 TABLET BY MOUTH DAILY WITH BREAKFAST. 30 tablet 5  . omeprazole (PRILOSEC) 20 MG capsule TAKE 1 CAPSULE BY MOUTH ONCE DAILY 30 capsule 12  . potassium chloride SA (K-DUR,KLOR-CON) 20 MEQ tablet Take 1 tablet (20 mEq total) by mouth daily. 3 tablet 0   No current facility-administered medications for this visit.     Review of Systems Review of Systems  Respiratory: Negative.   Cardiovascular: Negative.     Blood pressure (!) 153/107, pulse 99, temperature (!) 97.2 F (36.2 C), temperature source Skin, height 5' (1.524 m), weight 157 lb (71.2 kg), SpO2 97 %.  Physical Exam Physical Exam Exam conducted with a chaperone present.  Constitutional:      Appearance: She is well-developed.  Eyes:     General: No scleral icterus.    Conjunctiva/sclera: Conjunctivae normal.  Neck:     Musculoskeletal: Neck supple.  Cardiovascular:     Rate and Rhythm: Normal rate and regular rhythm.     Heart sounds: Normal heart sounds.  Pulmonary:     Effort: Pulmonary effort is normal.     Breath sounds: Normal breath sounds.  Chest:     Breasts:        Left: Normal. No inverted nipple, mass, nipple discharge, skin  change or tenderness.    Lymphadenopathy:     Cervical: No cervical adenopathy.  Skin:    General: Skin is warm and dry.  Neurological:     Mental Status: She is alert and oriented to person, place, and time.     Data Reviewed April 23, 2018 left breast screening mammogram was reviewed.  No interval change.  BI-RADS-1.  Assessment    No evidence of local/regional recurrence of her previously resected right breast cancer.    Plan  The patient has been asked to consider an additional 5 years of antiestrogen therapy.  The role of BCI testing was discussed and the patient was encouraged to continue letrozole if she is found to be a candidate for additional treatment.  Patient will be asked to return to the office in one year with a left breast  screening  mammogram order by Cancer center. The patient is aware to call back for any questions or concerns.  HPI, Physical Exam, Assessment and Plan have been scribed under the direction and in the presence of Hervey Ard, MD.  Gaspar Cola, CMA  I have completed the exam and reviewed the above documentation for accuracy and completeness.  I agree with the above.  Haematologist has been used and any errors in dictation or transcription are unintentional.  Hervey Ard, M.D., F.A.C.S.  Abigail Wheeler 05/27/2018, 3:19 PM

## 2018-06-25 DIAGNOSIS — M189 Osteoarthritis of first carpometacarpal joint, unspecified: Secondary | ICD-10-CM | POA: Insufficient documentation

## 2018-06-25 DIAGNOSIS — G5603 Carpal tunnel syndrome, bilateral upper limbs: Secondary | ICD-10-CM | POA: Insufficient documentation

## 2018-07-13 ENCOUNTER — Other Ambulatory Visit: Payer: Self-pay

## 2018-07-13 MED ORDER — LOSARTAN POTASSIUM 25 MG PO TABS: 25.0000 mg | ORAL_TABLET | Freq: Every day | ORAL | 0 refills | Status: DC

## 2018-07-13 MED ORDER — OMEPRAZOLE 20 MG PO CPDR: 20.0000 mg | DELAYED_RELEASE_CAPSULE | Freq: Every day | ORAL | 0 refills | Status: DC

## 2018-07-13 NOTE — Telephone Encounter (Signed)
Spoke need appt for further refills and also lmom for losartan she taking or she taking a combination hyzaar

## 2018-07-13 NOTE — Telephone Encounter (Signed)
Spoke with she taking losartan 25 mg not hyzaar as per heather send losartan 25mg  asnd pt need follow up for further refills

## 2018-07-16 ENCOUNTER — Ambulatory Visit (INDEPENDENT_AMBULATORY_CARE_PROVIDER_SITE_OTHER): Payer: 59 | Admitting: Nurse Practitioner

## 2018-07-16 ENCOUNTER — Encounter: Payer: Self-pay | Admitting: Nurse Practitioner

## 2018-07-16 VITALS — BP 132/70 | HR 90 | Resp 16 | Ht 61.0 in | Wt 160.0 lb

## 2018-07-16 DIAGNOSIS — I10 Essential (primary) hypertension: Secondary | ICD-10-CM

## 2018-07-16 DIAGNOSIS — E119 Type 2 diabetes mellitus without complications: Secondary | ICD-10-CM | POA: Diagnosis not present

## 2018-07-16 DIAGNOSIS — E1165 Type 2 diabetes mellitus with hyperglycemia: Secondary | ICD-10-CM | POA: Diagnosis not present

## 2018-07-16 LAB — POCT GLYCOSYLATED HEMOGLOBIN (HGB A1C): Hemoglobin A1C: 6.5 % — AB (ref 4.0–5.6)

## 2018-07-16 NOTE — Progress Notes (Signed)
[facility] [place], [place] [address]  Internal MEDICINE  Office Visit Note  Patient Name: Abigail Wheeler  [id number]  [id number]  Date of Service: 07/27/2018  Chief Complaint  Patient presents with  . Medical Management of Chronic Issues    medication follow up  . Hypertension  . Diabetes    Hypertension  This is a chronic problem. The problem is controlled. Pertinent negatives include no chest pain, headaches, neck pain, palpitations or shortness of breath. There are no associated agents to hypertension. Risk factors for coronary artery disease include diabetes mellitus and post-menopausal state. Past treatments include alpha 1 blockers and diuretics. The current treatment provides moderate improvement. There are no compliance problems.   Diabetes  She presents for her follow-up diabetic visit. She has type 2 diabetes mellitus. Her disease course has been stable. There are no hypoglycemic associated symptoms. Pertinent negatives for hypoglycemia include no dizziness, headaches, nervousness/anxiousness or tremors. Pertinent negatives for diabetes include no chest pain, no fatigue, no polydipsia and no polyuria. There are no hypoglycemic complications. Symptoms are stable. There are no diabetic complications. Risk factors for coronary artery disease include hypertension and post-menopausal. Current diabetic treatment includes oral agent (monotherapy). She is compliant with treatment all of the time. Meal planning includes avoidance of concentrated sweets. She participates in exercise three times a week. An ACE inhibitor/angiotensin II receptor blocker is being taken.       Current Medication: Outpatient Encounter Medications as of 07/16/2018  Medication Sig  . acetaminophen (TYLENOL) 500 MG tablet Take 500 mg by mouth every 6 (six) hours as needed.  . calcium carbonate (OS-CAL) 600 MG TABS tablet Take 600 mg by mouth daily.  . Cholecalciferol (VITAMIN D-3)  1000 UNITS CAPS Take 1 capsule by mouth daily.  Marland Kitchen letrozole (FEMARA) 2.5 MG tablet TAKE 1 TABLET BY MOUTH ONCE DAILY  . losartan (COZAAR) 25 MG tablet Take 1 tablet (25 mg total) by mouth daily.  Marland Kitchen losartan-hydrochlorothiazide (HYZAAR) 100-25 MG tablet Take 1 tablet by mouth daily.  . metFORMIN (GLUCOPHAGE-XR) 500 MG 24 hr tablet TAKE 1 TABLET BY MOUTH DAILY WITH BREAKFAST.  Marland Kitchen omeprazole (PRILOSEC) 20 MG capsule Take 1 capsule (20 mg total) by mouth daily.  . potassium chloride SA (K-DUR,KLOR-CON) 20 MEQ tablet Take 1 tablet (20 mEq total) by mouth daily.   No facility-administered encounter medications on file as of 07/16/2018.     Surgical History: Past Surgical History:  Procedure Laterality Date  . BREAST BIOPSY Right 04-05-13   positive  . BREAST SURGERY Right 05/20/13   mastectomy  . CESAREAN SECTION  1991  . MASTECTOMY Right 2015   chemo and radiation    Medical History: Past Medical History:  Diagnosis Date  . Arthritis   . Breast cancer ([facility]) 2015   chemo and radiation  . Cancer ([place]) 05-20-13   T1 N1 ER positive/PR negative HER2 negative. right breast  . Diabetes mellitus without complication ([place])   . GERD (gastroesophageal reflux disease)   . Hypertension   . PE (pulmonary embolism)   . Personal history of chemotherapy 2015   Right breast  . Personal history of radiation therapy 2015   Right breast  . Renal calculi     Family History: Family History  Problem Relation Age of Onset  . Lung cancer Father   . Breast cancer Maternal Grandmother 68    Social History   Socioeconomic History  . Marital status: Married    Spouse name: Not  on file  . Number of children: Not on file  . Years of education: Not on file  . Highest education level: Not on file  Occupational History  . Not on file  Social Needs  . Financial resource strain: Not on file  . Food insecurity:    Worry: Not on file    Inability: Not on file  . Transportation needs:    Medical: Not  on file    Non-medical: Not on file  Tobacco Use  . Smoking status: Never Smoker  . Smokeless tobacco: Never Used  Substance and Sexual Activity  . Alcohol use: No  . Drug use: No  . Sexual activity: Yes  Lifestyle  . Physical activity:    Days per week: Not on file    Minutes per session: Not on file  . Stress: Not on file  Relationships  . Social connections:    Talks on phone: Not on file    Gets together: Not on file    Attends religious service: Not on file    Active member of club or organization: Not on file    Attends meetings of clubs or organizations: Not on file    Relationship status: Not on file  . Intimate partner violence:    Fear of current or ex partner: Not on file    Emotionally abused: Not on file    Physically abused: Not on file    Forced sexual activity: Not on file  Other Topics Concern  . Not on file  Social History Narrative  . Not on file      Review of Systems  Constitutional: Negative for activity change, appetite change, chills, fatigue, fever and unexpected weight change.  HENT: Negative for congestion, postnasal drip, rhinorrhea, sneezing and sore throat.   Respiratory: Negative for cough, chest tightness and shortness of breath.   Cardiovascular: Negative for chest pain and palpitations.  Gastrointestinal: Negative for abdominal pain, constipation, diarrhea, nausea and vomiting.  Endocrine: Negative for cold intolerance, heat intolerance, polydipsia and polyuria.       Blood sugars doing well   Genitourinary: Negative for dysuria, flank pain, frequency and urgency.  Musculoskeletal: Negative for arthralgias, back pain, joint swelling and neck pain.  Skin: Negative for rash.  Allergic/Immunologic: Negative for environmental allergies.  Neurological: Negative for dizziness, tremors, numbness and headaches.  Hematological: Negative for adenopathy. Does not bruise/bleed easily.  Psychiatric/Behavioral: Negative for behavioral problems  (Depression), sleep disturbance and suicidal ideas. The patient is not nervous/anxious.     Today's Vitals   07/16/18 1628  BP: 132/70  Pulse: 90  Resp: 16  SpO2: 98%  Weight: 160 lb (72.6 kg)  Height: '5\' 1"'$  (1.549 m)   Body mass index is 30.23 kg/m.  Physical Exam Vitals signs and nursing note reviewed.  Constitutional:      General: She is not in acute distress.    Appearance: Normal appearance. She is well-developed. She is not diaphoretic.  HENT:     Head: Normocephalic and atraumatic.     Mouth/Throat:     Pharynx: No oropharyngeal exudate.  Eyes:     Conjunctiva/sclera: Conjunctivae normal.     Pupils: Pupils are equal, round, and reactive to light.  Neck:     Musculoskeletal: Normal range of motion and neck supple.     Thyroid: No thyromegaly.     Vascular: No JVD.     Trachea: No tracheal deviation.  Cardiovascular:     Rate and Rhythm: Normal rate and  regular rhythm.     Heart sounds: Normal heart sounds. No murmur. No friction rub. No gallop.      Comments: Mild tachycardia Pulmonary:     Effort: Pulmonary effort is normal. No respiratory distress.     Breath sounds: Normal breath sounds. No wheezing or rales.  Chest:     Chest wall: No tenderness.  Abdominal:     General: Bowel sounds are normal.     Palpations: Abdomen is soft.     Tenderness: There is no abdominal tenderness.  Musculoskeletal: Normal range of motion.  Lymphadenopathy:     Cervical: No cervical adenopathy.  Skin:    General: Skin is warm and dry.  Neurological:     Mental Status: She is alert and oriented to person, place, and time.     Cranial Nerves: No cranial nerve deficit.  Psychiatric:        Behavior: Behavior normal.        Thought Content: Thought content normal.        Judgment: Judgment normal.    Assessment/Plan: 1. Type 2 diabetes mellitus without complication, without long-term current use of insulin (HCC) - POCT HgB A1C 6.5today. continue metformin as prescribed.  Continue to limit intake of carbohydrates and sweets and incorporate exercise into daily routine.   2. Essential hypertension Stable. Continue blood pressure medication as prescribed   General Counseling: Lailana verbalizes understanding of the findings of todays visit and agrees with plan of treatment. I have discussed any further diagnostic evaluation that may be needed or ordered today. We also reviewed her medications today. she has been encouraged to call the office with any questions or concerns that should arise related to todays visit.  Diabetes Counseling:  1. Addition of ACE inh/ ARB'S for nephroprotection. Microalbumin is updated  2. Diabetic foot care, prevention of complications. Podiatry consult 3. Exercise and lose weight.  4. Diabetic eye examination, Diabetic eye exam is updated  5. Monitor blood sugar closlely. nutrition counseling.  6. Sign and symptoms of hypoglycemia including shaking sweating,confusion and headaches.  Hypertension Counseling:   The following hypertensive lifestyle modification were recommended and discussed:  1. Limiting alcohol intake to less than 1 oz/day of ethanol:(24 oz of beer or 8 oz of wine or 2 oz of 100-proof whiskey). 2. Take baby ASA 81 mg daily. 3. Importance of regular aerobic exercise and losing weight. 4. Reduce dietary saturated fat and cholesterol intake for overall cardiovascular health. 5. Maintaining adequate dietary potassium, calcium, and magnesium intake. 6. Regular monitoring of the blood pressure. 7. Reduce sodium intake to less than 100 mmol/day (less than 2.3 gm of sodium or less than 6 gm of sodium choride)   This patient was seen by [place] with Dr Lavera Guise as a part of collaborative care agreement  Orders Placed This Encounter  Procedures  . POCT HgB A1C      Time spent: 25 Minutes      Dr Lavera Guise Internal medicine

## 2018-07-16 NOTE — Progress Notes (Signed)
Pt blood pressure elevated, taken twice 1st reading 182/87 2nd reading 168/88 Informed provider

## 2018-07-23 DIAGNOSIS — M189 Osteoarthritis of first carpometacarpal joint, unspecified: Secondary | ICD-10-CM | POA: Diagnosis not present

## 2018-07-23 DIAGNOSIS — G5603 Carpal tunnel syndrome, bilateral upper limbs: Secondary | ICD-10-CM | POA: Diagnosis not present

## 2018-08-12 ENCOUNTER — Other Ambulatory Visit: Payer: Self-pay | Admitting: Nurse Practitioner

## 2018-08-12 DIAGNOSIS — Z794 Long term (current) use of insulin: Principal | ICD-10-CM

## 2018-08-12 DIAGNOSIS — E119 Type 2 diabetes mellitus without complications: Secondary | ICD-10-CM

## 2018-08-12 MED ORDER — LETROZOLE 2.5 MG PO TABS: 2.5000 mg | ORAL_TABLET | Freq: Every day | ORAL | 12 refills | Status: DC

## 2018-08-12 MED ORDER — LOSARTAN POTASSIUM 25 MG PO TABS: 25.0000 mg | ORAL_TABLET | Freq: Every day | ORAL | 0 refills | Status: DC

## 2018-08-12 MED ORDER — OMEPRAZOLE 20 MG PO CPDR: 20.0000 mg | DELAYED_RELEASE_CAPSULE | Freq: Every day | ORAL | 0 refills | Status: DC

## 2018-08-12 MED ORDER — METFORMIN HCL ER 500 MG PO TB24: 500.0000 mg | ORAL_TABLET | Freq: Every day | ORAL | 5 refills | Status: DC

## 2018-08-31 ENCOUNTER — Other Ambulatory Visit: Payer: 59

## 2018-09-16 ENCOUNTER — Other Ambulatory Visit: Payer: Self-pay

## 2018-09-16 DIAGNOSIS — Z794 Long term (current) use of insulin: Secondary | ICD-10-CM

## 2018-09-16 DIAGNOSIS — E119 Type 2 diabetes mellitus without complications: Secondary | ICD-10-CM

## 2018-09-16 MED ORDER — METFORMIN HCL ER 500 MG PO TB24: 500.0000 mg | ORAL_TABLET | Freq: Every day | ORAL | 5 refills | Status: DC

## 2018-09-16 MED ORDER — LOSARTAN POTASSIUM 25 MG PO TABS: 25.0000 mg | ORAL_TABLET | Freq: Every day | ORAL | 5 refills | Status: DC

## 2018-09-24 ENCOUNTER — Other Ambulatory Visit: Payer: Self-pay

## 2018-09-24 MED ORDER — OMEPRAZOLE 20 MG PO CPDR: 20.0000 mg | DELAYED_RELEASE_CAPSULE | Freq: Every day | ORAL | 3 refills | Status: DC

## 2018-10-19 ENCOUNTER — Ambulatory Visit (INDEPENDENT_AMBULATORY_CARE_PROVIDER_SITE_OTHER): Payer: 59 | Admitting: Nurse Practitioner

## 2018-10-19 ENCOUNTER — Other Ambulatory Visit: Payer: Self-pay

## 2018-10-19 ENCOUNTER — Encounter: Payer: Self-pay | Admitting: Nurse Practitioner

## 2018-10-19 VITALS — BP 150/84 | HR 103 | Resp 16 | Ht 61.0 in | Wt 157.6 lb

## 2018-10-19 DIAGNOSIS — Z0001 Encounter for general adult medical examination with abnormal findings: Secondary | ICD-10-CM | POA: Diagnosis not present

## 2018-10-19 DIAGNOSIS — R300 Dysuria: Secondary | ICD-10-CM | POA: Diagnosis not present

## 2018-10-19 DIAGNOSIS — Z1211 Encounter for screening for malignant neoplasm of colon: Secondary | ICD-10-CM

## 2018-10-19 DIAGNOSIS — E1165 Type 2 diabetes mellitus with hyperglycemia: Secondary | ICD-10-CM

## 2018-10-19 DIAGNOSIS — I10 Essential (primary) hypertension: Secondary | ICD-10-CM

## 2018-10-19 LAB — POCT GLYCOSYLATED HEMOGLOBIN (HGB A1C): Hemoglobin A1C: 6.4 % — AB (ref 4.0–5.6)

## 2018-10-19 NOTE — Progress Notes (Signed)
[facility] [place], [place] [address]  Internal MEDICINE  Office Visit Note  Patient Name: Abigail Wheeler  [id number]  [id number]  Date of Service: 10/24/2018   Pt is here for routine health maintenance examination   Chief Complaint  Patient presents with  . Arthritis  . Hypertension  . Gastroesophageal Reflux  . Diabetes     The patient is here for health maintenance exam. She states that she is doing well. Blood pressure slightly elevated today. States that it is always elevated when she comes to the doctor. Takes her blood pressure everyday at home. Generally running 190/80. Has five year visit with oncology coming up. Hoping to discontinue letrozole. She states blood sugars are doing well. She is due to have diabetic eye exam. She states that eye exam was cancelled per the eye clinic. She is also due to have colon cancer screening.    Current Medication: Outpatient Encounter Medications as of 10/19/2018  Medication Sig  . acetaminophen (TYLENOL) 500 MG tablet Take 500 mg by mouth every 6 (six) hours as needed.  . calcium carbonate (OS-CAL) 600 MG TABS tablet Take 600 mg by mouth daily.  . Cholecalciferol (VITAMIN D-3) 1000 UNITS CAPS Take 1 capsule by mouth daily.  Marland Kitchen letrozole (FEMARA) 2.5 MG tablet Take 1 tablet (2.5 mg total) by mouth daily.  Marland Kitchen losartan (COZAAR) 25 MG tablet Take 1 tablet (25 mg total) by mouth daily.  . metFORMIN (GLUCOPHAGE-XR) 500 MG 24 hr tablet Take 1 tablet (500 mg total) by mouth daily with breakfast.  . omeprazole (PRILOSEC) 20 MG capsule Take 1 capsule (20 mg total) by mouth daily.  . potassium chloride SA (K-DUR,KLOR-CON) 20 MEQ tablet Take 1 tablet (20 mEq total) by mouth daily.  . [DISCONTINUED] losartan-hydrochlorothiazide (HYZAAR) 100-25 MG tablet Take 1 tablet by mouth daily.   No facility-administered encounter medications on file as of 10/19/2018.     Surgical History: Past Surgical History:  Procedure  Laterality Date  . BREAST BIOPSY Right 04-05-13   positive  . BREAST SURGERY Right 05/20/13   mastectomy  . CESAREAN SECTION  1991  . MASTECTOMY Right 2015   chemo and radiation    Medical History: Past Medical History:  Diagnosis Date  . Arthritis   . Breast cancer ([place]) 2015   chemo and radiation  . Cancer ([place]) 05-20-13   T1 N1 ER positive/PR negative HER2 negative. right breast  . Diabetes mellitus without complication ([place])   . GERD (gastroesophageal reflux disease)   . Hypertension   . PE (pulmonary embolism)   . Personal history of chemotherapy 2015   Right breast  . Personal history of radiation therapy 2015   Right breast  . Renal calculi     Family History: Family History  Problem Relation Age of Onset  . Lung cancer Father   . Breast cancer Maternal Grandmother 68      Review of Systems  Constitutional: Negative for activity change, appetite change, chills, fatigue, fever and unexpected weight change.  HENT: Negative for congestion, postnasal drip, rhinorrhea, sneezing and sore throat.   Respiratory: Negative for cough, chest tightness, shortness of breath and wheezing.   Cardiovascular: Negative for chest pain and palpitations.       Blood pressure elevated today.   Gastrointestinal: Negative for abdominal pain, constipation, diarrhea, nausea and vomiting.  Endocrine: Negative for cold intolerance, heat intolerance, polydipsia and polyuria.       Blood sugars doing well   Genitourinary:  Negative for dysuria.  Musculoskeletal: Negative for arthralgias, back pain, joint swelling and neck pain.  Skin: Negative for rash.  Allergic/Immunologic: Negative for environmental allergies.  Neurological: Negative for dizziness, tremors, numbness and headaches.  Hematological: Negative for adenopathy. Does not bruise/bleed easily.  Psychiatric/Behavioral: Negative for behavioral problems (Depression), sleep disturbance and suicidal ideas. The patient is not  nervous/anxious.     Today's Vitals   10/19/18 1520  BP: (!) 150/84  Pulse: (!) 103  Resp: 16  SpO2: 97%  Weight: 157 lb 9.6 oz (71.5 kg)  Height: _0  (1.549 m)   Body mass index is 29.78 kg/m.   Physical Exam Vitals signs and nursing note reviewed.  Constitutional:      General: She is not in acute distress.    Appearance: Normal appearance. She is well-developed. She is not diaphoretic.  HENT:     Head: Normocephalic and atraumatic.     Mouth/Throat:     Pharynx: No oropharyngeal exudate.  Eyes:     Conjunctiva/sclera: Conjunctivae normal.     Pupils: Pupils are equal, round, and reactive to light.  Neck:     Musculoskeletal: Normal range of motion and neck supple.     Thyroid: No thyromegaly.     Vascular: No JVD.     Trachea: No tracheal deviation.  Cardiovascular:     Rate and Rhythm: Normal rate and regular rhythm.     Pulses:          Dorsalis pedis pulses are 2+ on the right side and 2+ on the left side.       Posterior tibial pulses are 2+ on the right side and 2+ on the left side.     Heart sounds: Normal heart sounds. No murmur. No friction rub. No gallop.      Comments: Mild tachycardia Pulmonary:     Effort: Pulmonary effort is normal. No respiratory distress.     Breath sounds: Normal breath sounds. No wheezing or rales.  Chest:     Chest wall: No tenderness.  Abdominal:     General: Bowel sounds are normal.     Palpations: Abdomen is soft.     Tenderness: There is no abdominal tenderness.  Musculoskeletal: Normal range of motion.     Right foot: Normal range of motion. No deformity.     Left foot: Normal range of motion. No deformity.  Feet:     Right foot:     Protective Sensation: 10 sites tested. 10 sites sensed.     Skin integrity: Skin integrity normal.     Toenail Condition: Right toenails are normal.     Left foot:     Protective Sensation: 10 sites tested. 10 sites sensed.     Skin integrity: Skin integrity normal.     Toenail  Condition: Left toenails are normal.  Lymphadenopathy:     Cervical: No cervical adenopathy.  Skin:    General: Skin is warm and dry.  Neurological:     Mental Status: She is alert and oriented to person, place, and time.     Cranial Nerves: No cranial nerve deficit.  Psychiatric:        Behavior: Behavior normal.        Thought Content: Thought content normal.        Judgment: Judgment normal.    Depression screen [facility] 2/9 10/19/2018 07/16/2018 09/19/2017 08/18/2017 04/24/2017  Decreased Interest 0 0 0 0 0  Down, Depressed, Hopeless 0 0 0 0 0  PHQ -  2 Score 0 0 0 0 0    Functional Status Survey: Is the patient deaf or have difficulty hearing?: No Does the patient have difficulty seeing, even when wearing glasses/contacts?: No Does the patient have difficulty concentrating, remembering, or making decisions?: No Does the patient have difficulty walking or climbing stairs?: No Does the patient have difficulty dressing or bathing?: No Does the patient have difficulty doing errands alone such as visiting a doctor's office or shopping?: No  MMSE - [place] Exam 10/19/2018  Orientation to time 5  Orientation to Place 5  Registration 3  Attention/ Calculation 5  Recall 3  Language- name 2 objects 2  Language- repeat 1  Language- follow 3 step command 3  Language- read & follow direction 1  Write a sentence 1  Copy design 1  Total score 30    Fall Risk  10/19/2018 07/16/2018 07/16/2018 05/26/2018 09/19/2017  Falls in the past year? 0 0 0 0 No      LABS: Recent Results (from the past 2160 hour(s))  Urinalysis, Routine w reflex microscopic     Status: Abnormal   Collection Time: 10/19/18  3:21 AM  Result Value Ref Range   Specific Gravity, UA 1.012 1.005 - 1.030   pH, UA 6.0 5.0 - 7.5   Color, UA Yellow Yellow   Appearance Ur Clear Clear   Leukocytes,UA 2+ (A) Negative   Protein,UA Negative Negative/Trace   Glucose, UA Negative Negative   Ketones, UA Negative Negative    RBC, UA Negative Negative   Bilirubin, UA Negative Negative   Urobilinogen, Ur 0.2 0.2 - 1.0 mg/dL   Nitrite, UA Negative Negative   Microscopic Examination See below:     Comment: Microscopic was indicated and was performed.  Microscopic Examination     Status: Abnormal   Collection Time: 10/19/18  3:21 AM   URINE  Result Value Ref Range   WBC, UA 6-10 (A) 0 - 5 /hpf   RBC 0-2 0 - 2 /hpf   Epithelial Cells (non renal) 0-10 0 - 10 /hpf   Casts None seen None seen /lpf   Mucus, UA Present Not Estab.   Bacteria, UA Few None seen/Few  POCT HgB A1C     Status: Abnormal   Collection Time: 10/19/18  3:37 PM  Result Value Ref Range   Hemoglobin A1C 6.4 (A) 4.0 - 5.6 %   HbA1c POC (<> result, manual entry)     HbA1c, POC (prediabetic range)     HbA1c, POC (controlled diabetic range)      Assessment/Plan: 1. Encounter for health maintenance examination with abnormal findings Annual health maintenance exam today.  2. Type 2 diabetes mellitus with hyperglycemia, unspecified whether long term insulin use (HCC) - POCT HgB A1C 6.4 today. Continue metformin as prescribed.   3. Essential hypertension Stable. Continue bp medication as prescribed   4. Screening for colon cancer Refer to GI for screening colonoscopy.  - Ambulatory referral to Gastroenterology  5. Dysuria - Urinalysis, Routine w reflex microscopic  General Counseling: Rozell verbalizes understanding of the findings of todays visit and agrees with plan of treatment. I have discussed any further diagnostic evaluation that may be needed or ordered today. We also reviewed her medications today. she has been encouraged to call the office with any questions or concerns that should arise related to todays visit.    Counseling:  Diabetes Counseling:  1. Addition of ACE inh/ ARB'S for nephroprotection. Microalbumin is updated  2. Diabetic  foot care, prevention of complications. Podiatry consult 3. Exercise and lose weight.   4. Diabetic eye examination, Diabetic eye exam is updated  5. Monitor blood sugar closlely. nutrition counseling.  6. Sign and symptoms of hypoglycemia including shaking sweating,confusion and headaches.  This patient was seen by Leretha Pol FNP Collaboration with Dr Lavera Guise as a part of collaborative care agreement  Orders Placed This Encounter  Procedures  . Microscopic Examination  . Urinalysis, Routine w reflex microscopic  . Ambulatory referral to Gastroenterology  . POCT HgB A1C      Time spent: Simpson, MD  Internal Medicine

## 2018-10-20 LAB — URINALYSIS, ROUTINE W REFLEX MICROSCOPIC
Bilirubin, UA: NEGATIVE
Glucose, UA: NEGATIVE
Ketones, UA: NEGATIVE
Nitrite, UA: NEGATIVE
Protein,UA: NEGATIVE
RBC, UA: NEGATIVE
Specific Gravity, UA: 1.012 (ref 1.005–1.030)
Urobilinogen, Ur: 0.2 mg/dL (ref 0.2–1.0)
pH, UA: 6 (ref 5.0–7.5)

## 2018-10-20 LAB — MICROSCOPIC EXAMINATION: Casts: NONE SEEN /lpf

## 2018-10-24 DIAGNOSIS — E1165 Type 2 diabetes mellitus with hyperglycemia: Secondary | ICD-10-CM | POA: Insufficient documentation

## 2018-10-24 DIAGNOSIS — Z1211 Encounter for screening for malignant neoplasm of colon: Secondary | ICD-10-CM | POA: Insufficient documentation

## 2018-11-03 ENCOUNTER — Inpatient Hospital Stay (HOSPITAL_BASED_OUTPATIENT_CLINIC_OR_DEPARTMENT_OTHER): Payer: 59 | Admitting: Oncology

## 2018-11-03 ENCOUNTER — Inpatient Hospital Stay: Payer: 59 | Attending: Oncology

## 2018-11-03 ENCOUNTER — Encounter: Payer: Self-pay | Admitting: Oncology

## 2018-11-03 ENCOUNTER — Other Ambulatory Visit: Payer: Self-pay

## 2018-11-03 VITALS — BP 149/84 | HR 99 | Temp 98.7°F | Resp 18 | Ht 61.0 in | Wt 155.0 lb

## 2018-11-03 DIAGNOSIS — Z7901 Long term (current) use of anticoagulants: Secondary | ICD-10-CM

## 2018-11-03 DIAGNOSIS — C50911 Malignant neoplasm of unspecified site of right female breast: Secondary | ICD-10-CM | POA: Diagnosis not present

## 2018-11-03 DIAGNOSIS — Z08 Encounter for follow-up examination after completed treatment for malignant neoplasm: Secondary | ICD-10-CM

## 2018-11-03 DIAGNOSIS — Z9011 Acquired absence of right breast and nipple: Secondary | ICD-10-CM | POA: Diagnosis not present

## 2018-11-03 DIAGNOSIS — Z79899 Other long term (current) drug therapy: Secondary | ICD-10-CM | POA: Insufficient documentation

## 2018-11-03 DIAGNOSIS — Z923 Personal history of irradiation: Secondary | ICD-10-CM | POA: Diagnosis not present

## 2018-11-03 DIAGNOSIS — Z7984 Long term (current) use of oral hypoglycemic drugs: Secondary | ICD-10-CM | POA: Insufficient documentation

## 2018-11-03 DIAGNOSIS — Z170 Estrogen receptor positive status [ER+]: Secondary | ICD-10-CM | POA: Diagnosis not present

## 2018-11-03 DIAGNOSIS — Z86711 Personal history of pulmonary embolism: Secondary | ICD-10-CM | POA: Insufficient documentation

## 2018-11-03 DIAGNOSIS — M129 Arthropathy, unspecified: Secondary | ICD-10-CM | POA: Diagnosis not present

## 2018-11-03 DIAGNOSIS — Z79811 Long term (current) use of aromatase inhibitors: Secondary | ICD-10-CM

## 2018-11-03 DIAGNOSIS — K219 Gastro-esophageal reflux disease without esophagitis: Secondary | ICD-10-CM | POA: Insufficient documentation

## 2018-11-03 DIAGNOSIS — M8580 Other specified disorders of bone density and structure, unspecified site: Secondary | ICD-10-CM

## 2018-11-03 DIAGNOSIS — E119 Type 2 diabetes mellitus without complications: Secondary | ICD-10-CM

## 2018-11-03 DIAGNOSIS — I10 Essential (primary) hypertension: Secondary | ICD-10-CM | POA: Diagnosis not present

## 2018-11-03 DIAGNOSIS — Z9221 Personal history of antineoplastic chemotherapy: Secondary | ICD-10-CM | POA: Insufficient documentation

## 2018-11-03 DIAGNOSIS — Z853 Personal history of malignant neoplasm of breast: Secondary | ICD-10-CM

## 2018-11-03 LAB — CBC WITH DIFFERENTIAL/PLATELET
Abs Immature Granulocytes: 0.01 10*3/uL (ref 0.00–0.07)
Basophils Absolute: 0 10*3/uL (ref 0.0–0.1)
Basophils Relative: 0 %
Eosinophils Absolute: 0.1 10*3/uL (ref 0.0–0.5)
Eosinophils Relative: 1 %
HCT: 38.8 % (ref 36.0–46.0)
Hemoglobin: 13.2 g/dL (ref 12.0–15.0)
Immature Granulocytes: 0 %
Lymphocytes Relative: 32 %
Lymphs Abs: 1.4 10*3/uL (ref 0.7–4.0)
MCH: 29.5 pg (ref 26.0–34.0)
MCHC: 34 g/dL (ref 30.0–36.0)
MCV: 86.6 fL (ref 80.0–100.0)
Monocytes Absolute: 0.4 10*3/uL (ref 0.1–1.0)
Monocytes Relative: 8 %
Neutro Abs: 2.6 10*3/uL (ref 1.7–7.7)
Neutrophils Relative %: 59 %
Platelets: 216 10*3/uL (ref 150–400)
RBC: 4.48 MIL/uL (ref 3.87–5.11)
RDW: 12.7 % (ref 11.5–15.5)
WBC: 4.4 10*3/uL (ref 4.0–10.5)
nRBC: 0 % (ref 0.0–0.2)

## 2018-11-03 LAB — COMPREHENSIVE METABOLIC PANEL
ALT: 32 U/L (ref 0–44)
AST: 26 U/L (ref 15–41)
Albumin: 4 g/dL (ref 3.5–5.0)
Alkaline Phosphatase: 54 U/L (ref 38–126)
Anion gap: 9 (ref 5–15)
BUN: 14 mg/dL (ref 8–23)
CO2: 27 mmol/L (ref 22–32)
Calcium: 10 mg/dL (ref 8.9–10.3)
Chloride: 107 mmol/L (ref 98–111)
Creatinine, Ser: 0.99 mg/dL (ref 0.44–1.00)
GFR calc Af Amer: >60 mL/min (ref >60)
GFR calc non Af Amer: 58 mL/min — ABNORMAL LOW (ref >60)
Glucose, Bld: 141 mg/dL — ABNORMAL HIGH (ref 70–99)
Potassium: 3.5 mmol/L (ref 3.5–5.1)
Sodium: 143 mmol/L (ref 135–145)
Total Bilirubin: 0.6 mg/dL (ref 0.3–1.2)
Total Protein: 7.9 g/dL (ref 6.5–8.1)

## 2018-11-03 NOTE — Progress Notes (Signed)
Hematology/Oncology Consult note [facility]  [phone] [phone]  Patient Care Team: Lavera Guise, MD as PCP - General (Internal Medicine) Christene Lye, MD (General Surgery) Lavera Guise, MD (Internal Medicine) Lequita Asal, MD as Referring Physician (Hematology and Oncology)   Name of the patient: Abigail Wheeler  [id number]  08-22-1950   Date of visit: 11/03/18  Diagnosis- history of stage IIIb right breast cancer currently on Femara  Chief complaint/ Reason for visit-routine follow-up of breast cancer  Heme/Onc history: Patient is a 68 year old female who sees Dr. Mike Gip for her history of stage IIIb right breast cancer which was diagnosed in January 2015.  She is status post right mastectomy and sentinel lymph node biopsy.  Pathology showed grade 1 lobular carcinoma multifocal, largest focus was 1.7 cm invading the pectoralis muscle additional foci were 1.4 cm and 1 mm respectively.  Sentinel lymph node was also positive with extracapsular extension.  Tumor was ER PR positive and HER-2/neu negative.  Patient received dose dense AC followed by 12 weeks of Taxol.  She also received adjuvant radiation to her chest and axilla.  She then started taking Femara.  Patient also diagnosed with multiple left lower lobe pulmonary emboli in April 2015.  Hypercoagulable work-up was negative and patient is on Xarelto.  Bone density scan in May 2018 showed osteopenia with a score of -1.9.  Patient was not a candidate for FRAX score due to Femara.  Interval history-she is tolerating Femara well.  Appetite and weight are stable.  She denies any new concerns at this time  ECOG PS- 1 Pain scale- 0   Review of systems- Review of Systems  Constitutional: Negative for chills, fever, malaise/fatigue and weight loss.  HENT: Negative for congestion, ear discharge and nosebleeds.   Eyes: Negative for blurred vision.  Respiratory:  Negative for cough, hemoptysis, sputum production, shortness of breath and wheezing.   Cardiovascular: Negative for chest pain, palpitations, orthopnea and claudication.  Gastrointestinal: Negative for abdominal pain, blood in stool, constipation, diarrhea, heartburn, melena, nausea and vomiting.  Genitourinary: Negative for dysuria, flank pain, frequency, hematuria and urgency.  Musculoskeletal: Negative for back pain, joint pain and myalgias.  Skin: Negative for rash.  Neurological: Negative for dizziness, tingling, focal weakness, seizures, weakness and headaches.  Endo/Heme/Allergies: Does not bruise/bleed easily.  Psychiatric/Behavioral: Negative for depression and suicidal ideas. The patient does not have insomnia.        No Known Allergies   Past Medical History:  Diagnosis Date  . Arthritis   . Breast cancer ([place]) 2015   chemo and radiation  . Cancer ([place]) 05-20-13   T1 N1 ER positive/PR negative HER2 negative. right breast  . Diabetes mellitus without complication ([place])   . GERD (gastroesophageal reflux disease)   . Hypertension   . PE (pulmonary embolism)   . Personal history of chemotherapy 2015   Right breast  . Personal history of radiation therapy 2015   Right breast  . Renal calculi      Past Surgical History:  Procedure Laterality Date  . BREAST BIOPSY Right 04-05-13   positive  . BREAST SURGERY Right 05/20/13   mastectomy  . CESAREAN SECTION  1991  . MASTECTOMY Right 2015   chemo and radiation    Social History   Socioeconomic History  . Marital status: Married    Spouse name: Not on file  . Number of children: Not on file  . Years of education: Not on  file  . Highest education level: Not on file  Occupational History  . Not on file  Social Needs  . Financial resource strain: Not on file  . Food insecurity    Worry: Not on file    Inability: Not on file  . Transportation needs    Medical: Not on file    Non-medical: Not on file  Tobacco  Use  . Smoking status: Never Smoker  . Smokeless tobacco: Never Used  Substance and Sexual Activity  . Alcohol use: No  . Drug use: No  . Sexual activity: Yes  Lifestyle  . Physical activity    Days per week: Not on file    Minutes per session: Not on file  . Stress: Not on file  Relationships  . Social Herbalist on phone: Not on file    Gets together: Not on file    Attends religious service: Not on file    Active member of club or organization: Not on file    Attends meetings of clubs or organizations: Not on file    Relationship status: Not on file  . Intimate partner violence    Fear of current or ex partner: Not on file    Emotionally abused: Not on file    Physically abused: Not on file    Forced sexual activity: Not on file  Other Topics Concern  . Not on file  Social History Narrative  . Not on file    Family History  Problem Relation Age of Onset  . Lung cancer Father   . Breast cancer Maternal Grandmother 53     Current Outpatient Medications:  .  acetaminophen (TYLENOL) 500 MG tablet, Take 500 mg by mouth every 6 (six) hours as needed., Disp: , Rfl:  .  calcium carbonate (OS-CAL) 600 MG TABS tablet, Take 600 mg by mouth daily., Disp: , Rfl:  .  Cholecalciferol (VITAMIN D-3) 1000 UNITS CAPS, Take 1 capsule by mouth daily., Disp: , Rfl:  .  letrozole (FEMARA) 2.5 MG tablet, Take 1 tablet (2.5 mg total) by mouth daily., Disp: 30 tablet, Rfl: 12 .  losartan (COZAAR) 25 MG tablet, Take 1 tablet (25 mg total) by mouth daily., Disp: 30 tablet, Rfl: 5 .  metFORMIN (GLUCOPHAGE-XR) 500 MG 24 hr tablet, Take 1 tablet (500 mg total) by mouth daily with breakfast., Disp: 30 tablet, Rfl: 5 .  omeprazole (PRILOSEC) 20 MG capsule, Take 1 capsule (20 mg total) by mouth daily., Disp: 30 capsule, Rfl: 3 .  potassium chloride SA (K-DUR,KLOR-CON) 20 MEQ tablet, Take 1 tablet (20 mEq total) by mouth daily., Disp: 3 tablet, Rfl: 0  Physical exam:  Vitals:   11/03/18  1116  BP: (!) 149/84  Pulse: 99  Resp: 18  Temp: 98.7 F (37.1 C)  TempSrc: Tympanic  Weight: 155 lb (70.3 kg)  Height: _0  (1.549 m)   Physical Exam Constitutional:      General: She is not in acute distress. HENT:     Head: Normocephalic and atraumatic.  Eyes:     Pupils: Pupils are equal, round, and reactive to light.  Neck:     Musculoskeletal: Normal range of motion.  Cardiovascular:     Rate and Rhythm: Normal rate and regular rhythm.     Heart sounds: Normal heart sounds.  Pulmonary:     Effort: Pulmonary effort is normal.     Breath sounds: Normal breath sounds.  Abdominal:     General: Bowel  sounds are normal.     Palpations: Abdomen is soft.  Skin:    General: Skin is warm and dry.  Neurological:     Mental Status: She is alert and oriented to person, place, and time.   Patient is status post right mastectomy without reconstruction.  No evidence of chest wall recurrence.  No palpable left breast mass.  No palpable bilateral axillary adenopathy.  CMP Latest Ref Rng & Units 11/03/2018  Glucose 70 - 99 mg/dL 141(H)  BUN 8 - 23 mg/dL 14  Creatinine 0.44 - 1.00 mg/dL 0.99  Sodium 135 - 145 mmol/L 143  Potassium 3.5 - 5.1 mmol/L 3.5  Chloride 98 - 111 mmol/L 107  CO2 22 - 32 mmol/L 27  Calcium 8.9 - 10.3 mg/dL 10.0  Total Protein 6.5 - 8.1 g/dL 7.9  Total Bilirubin 0.3 - 1.2 mg/dL 0.6  Alkaline Phos 38 - 126 U/L 54  AST 15 - 41 U/L 26  ALT 0 - 44 U/L 32   CBC Latest Ref Rng & Units 11/03/2018  WBC 4.0 - 10.5 K/uL 4.4  Hemoglobin 12.0 - 15.0 g/dL 13.2  Hematocrit 36.0 - 46.0 % 38.8  Platelets 150 - 400 K/uL 216      Assessment and plan- Patient is a 68 y.o. female with history of stage III B right breast cancer.  Postmastectomy adjuvant chemotherapy and radiation currently on Femara  Patient completed 5 years of adjuvant hormone therapy and is wondering if she needs to take it for 5 more years.  I recommended that with stage III breast cancer that  required adjuvant chemotherapy I would ideally recommend 10 years of adjuvant hormone therapy.  The patient really does not wish to take it beyond 5 years we could consider doing BCI testing to determine if she would benefit from additional 5 years of treatment.  Ideally I would like a repeat 10 years regardless of BCI testing.  Patient is willing to continue taking Femara at this time for another 5 years.  Patient does have baseline osteopenia and was supposed to get a bone density scan in May 2020 which was delayed due to [place].  We will reschedule at this time  I will see her back in 6 months no labs.  No role for routine tumor marker testing and nonmetastatic breast cancer   Visit Diagnosis 1. Use of letrozole (Femara)   2. Encounter for follow-up surveillance of breast cancer      Dr. Randa Evens, MD, MPH [facility] at [facility] [phone] 11/03/2018 3:17 PM

## 2018-11-04 LAB — CANCER ANTIGEN 27.29: CA 27.29: 10 U/mL (ref 0.0–38.6)

## 2018-11-05 ENCOUNTER — Other Ambulatory Visit: Payer: Self-pay

## 2018-11-05 ENCOUNTER — Ambulatory Visit
Admission: RE | Admit: 2018-11-05 | Discharge: 2018-11-05 | Disposition: A | Discharge status: Home / Self Care | Payer: 59 | Source: Ambulatory Visit | Attending: Oncology | Admitting: Oncology

## 2018-11-05 DIAGNOSIS — M8580 Other specified disorders of bone density and structure, unspecified site: Secondary | ICD-10-CM | POA: Diagnosis not present

## 2018-11-05 DIAGNOSIS — M8589 Other specified disorders of bone density and structure, multiple sites: Secondary | ICD-10-CM | POA: Diagnosis not present

## 2018-11-05 DIAGNOSIS — Z853 Personal history of malignant neoplasm of breast: Secondary | ICD-10-CM | POA: Diagnosis not present

## 2018-11-05 DIAGNOSIS — Z08 Encounter for follow-up examination after completed treatment for malignant neoplasm: Secondary | ICD-10-CM | POA: Insufficient documentation

## 2018-11-05 DIAGNOSIS — E119 Type 2 diabetes mellitus without complications: Secondary | ICD-10-CM | POA: Insufficient documentation

## 2018-11-05 DIAGNOSIS — Z780 Asymptomatic menopausal state: Secondary | ICD-10-CM | POA: Insufficient documentation

## 2018-11-05 DIAGNOSIS — Z13820 Encounter for screening for osteoporosis: Secondary | ICD-10-CM | POA: Diagnosis not present

## 2018-11-05 DIAGNOSIS — E559 Vitamin D deficiency, unspecified: Secondary | ICD-10-CM | POA: Insufficient documentation

## 2018-11-17 ENCOUNTER — Encounter: Payer: Self-pay | Admitting: General Surgery

## 2018-12-02 ENCOUNTER — Encounter: Payer: Self-pay | Admitting: *Deleted

## 2018-12-17 ENCOUNTER — Other Ambulatory Visit: Payer: Self-pay

## 2018-12-17 ENCOUNTER — Telehealth: Payer: Self-pay | Admitting: Gastroenterology

## 2018-12-17 DIAGNOSIS — Z1211 Encounter for screening for malignant neoplasm of colon: Secondary | ICD-10-CM

## 2018-12-17 MED ORDER — NA SULFATE-K SULFATE-MG SULF 17.5-3.13-1.6 GM/177ML PO SOLN: 1.0000 | Freq: Once | ORAL | 0 refills | Status: AC

## 2018-12-17 NOTE — Telephone Encounter (Signed)
Gastroenterology Pre-Procedure Review  Request Date: 12/30/18 Requesting Physician: Dr. Bonna Gains  PATIENT REVIEW QUESTIONS: The patient responded to the following health history questions as indicated:    1. Are you having any GI issues? no 2. Do you have a personal history of Polyps? no 3. Do you have a family history of Colon Cancer or Polyps? no 4. Diabetes Mellitus? yes (type 2 oral meds) 5. Joint replacements in the past 12 months?no 6. Major health problems in the past 3 months?no 7. Any artificial heart valves, MVP, or defibrillator?no    MEDICATIONS & ALLERGIES:    Patient reports the following regarding taking any anticoagulation/antiplatelet therapy:   Plavix, Coumadin, Eliquis, Xarelto, Lovenox, Pradaxa, Brilinta, or Effient? no Aspirin? no  Patient confirms/reports the following medications:  Current Outpatient Medications  Medication Sig Dispense Refill  . acetaminophen (TYLENOL) 500 MG tablet Take 500 mg by mouth every 6 (six) hours as needed.    . calcium carbonate (OS-CAL) 600 MG TABS tablet Take 600 mg by mouth daily.    . Cholecalciferol (VITAMIN D-3) 1000 UNITS CAPS Take 1 capsule by mouth daily.    Marland Kitchen letrozole (FEMARA) 2.5 MG tablet Take 1 tablet (2.5 mg total) by mouth daily. 30 tablet 12  . losartan (COZAAR) 25 MG tablet Take 1 tablet (25 mg total) by mouth daily. 30 tablet 5  . metFORMIN (GLUCOPHAGE-XR) 500 MG 24 hr tablet Take 1 tablet (500 mg total) by mouth daily with breakfast. 30 tablet 5  . omeprazole (PRILOSEC) 20 MG capsule Take 1 capsule (20 mg total) by mouth daily. 30 capsule 3  . potassium chloride SA (K-DUR,KLOR-CON) 20 MEQ tablet Take 1 tablet (20 mEq total) by mouth daily. 3 tablet 0   No current facility-administered medications for this visit.     Patient confirms/reports the following allergies:  No Known Allergies  No orders of the defined types were placed in this encounter.   AUTHORIZATION INFORMATION Primary  Insurance: 1D#: Group #:  Secondary Insurance: 1D#: Group #:  SCHEDULE INFORMATION: Date: 12/30/18 Time: Location:[facility]

## 2018-12-17 NOTE — Telephone Encounter (Signed)
Patient called after receiving a letter unable to contact to schedule a colonoscopy. On waiting list.

## 2018-12-24 ENCOUNTER — Telehealth: Payer: Self-pay

## 2018-12-24 NOTE — Telephone Encounter (Signed)
Returned patients call to let her know she should have her COVID test tomorrow at the [facility] located on [facility] between 10:30am and 12:30 pm.  Also asked her to call the office on Monday if she has not received her instructions by the weekend, or she can come by the office to pick up.  Thanks [employer]

## 2018-12-25 ENCOUNTER — Other Ambulatory Visit
Admission: RE | Admit: 2018-12-25 | Discharge: 2018-12-25 | Disposition: A | Discharge status: Home / Self Care | Payer: Medicare Other | Source: Ambulatory Visit | Attending: Gastroenterology | Admitting: Gastroenterology

## 2018-12-25 ENCOUNTER — Other Ambulatory Visit: Payer: Self-pay

## 2018-12-25 DIAGNOSIS — Z20828 Contact with and (suspected) exposure to other viral communicable diseases: Secondary | ICD-10-CM | POA: Insufficient documentation

## 2018-12-25 DIAGNOSIS — Z01812 Encounter for preprocedural laboratory examination: Secondary | ICD-10-CM | POA: Diagnosis not present

## 2018-12-26 LAB — SARS CORONAVIRUS 2 (TAT 6-24 HRS): SARS Coronavirus 2: NEGATIVE

## 2018-12-30 ENCOUNTER — Other Ambulatory Visit: Payer: Self-pay

## 2018-12-30 ENCOUNTER — Ambulatory Visit: Payer: Medicare Other | Admitting: Certified Registered"

## 2018-12-30 ENCOUNTER — Encounter: Payer: Self-pay | Admitting: *Deleted

## 2018-12-30 ENCOUNTER — Encounter
Admission: RE | Disposition: A | Discharge status: Home / Self Care | Payer: Self-pay | Source: Home / Self Care | Attending: Gastroenterology

## 2018-12-30 ENCOUNTER — Ambulatory Visit
Admission: RE | Admit: 2018-12-30 | Discharge: 2018-12-30 | Disposition: A | Discharge status: Home / Self Care | Payer: Medicare Other | Attending: Gastroenterology | Admitting: Gastroenterology

## 2018-12-30 DIAGNOSIS — K635 Polyp of colon: Secondary | ICD-10-CM | POA: Diagnosis not present

## 2018-12-30 DIAGNOSIS — Z79811 Long term (current) use of aromatase inhibitors: Secondary | ICD-10-CM | POA: Insufficient documentation

## 2018-12-30 DIAGNOSIS — M1990 Unspecified osteoarthritis, unspecified site: Secondary | ICD-10-CM | POA: Insufficient documentation

## 2018-12-30 DIAGNOSIS — K5730 Diverticulosis of large intestine without perforation or abscess without bleeding: Secondary | ICD-10-CM | POA: Diagnosis not present

## 2018-12-30 DIAGNOSIS — Z7984 Long term (current) use of oral hypoglycemic drugs: Secondary | ICD-10-CM | POA: Insufficient documentation

## 2018-12-30 DIAGNOSIS — K5790 Diverticulosis of intestine, part unspecified, without perforation or abscess without bleeding: Secondary | ICD-10-CM | POA: Diagnosis not present

## 2018-12-30 DIAGNOSIS — Z853 Personal history of malignant neoplasm of breast: Secondary | ICD-10-CM | POA: Diagnosis not present

## 2018-12-30 DIAGNOSIS — Z86711 Personal history of pulmonary embolism: Secondary | ICD-10-CM | POA: Diagnosis not present

## 2018-12-30 DIAGNOSIS — K621 Rectal polyp: Secondary | ICD-10-CM

## 2018-12-30 DIAGNOSIS — Z1211 Encounter for screening for malignant neoplasm of colon: Secondary | ICD-10-CM

## 2018-12-30 DIAGNOSIS — K648 Other hemorrhoids: Secondary | ICD-10-CM | POA: Insufficient documentation

## 2018-12-30 DIAGNOSIS — I10 Essential (primary) hypertension: Secondary | ICD-10-CM | POA: Diagnosis not present

## 2018-12-30 DIAGNOSIS — K219 Gastro-esophageal reflux disease without esophagitis: Secondary | ICD-10-CM | POA: Diagnosis not present

## 2018-12-30 DIAGNOSIS — D125 Benign neoplasm of sigmoid colon: Secondary | ICD-10-CM | POA: Diagnosis not present

## 2018-12-30 DIAGNOSIS — E119 Type 2 diabetes mellitus without complications: Secondary | ICD-10-CM | POA: Diagnosis not present

## 2018-12-30 DIAGNOSIS — D127 Benign neoplasm of rectosigmoid junction: Secondary | ICD-10-CM | POA: Insufficient documentation

## 2018-12-30 DIAGNOSIS — E1165 Type 2 diabetes mellitus with hyperglycemia: Secondary | ICD-10-CM | POA: Diagnosis not present

## 2018-12-30 DIAGNOSIS — Z79899 Other long term (current) drug therapy: Secondary | ICD-10-CM | POA: Insufficient documentation

## 2018-12-30 DIAGNOSIS — K644 Residual hemorrhoidal skin tags: Secondary | ICD-10-CM | POA: Diagnosis not present

## 2018-12-30 HISTORY — PX: COLONOSCOPY WITH PROPOFOL: SHX5780

## 2018-12-30 LAB — GLUCOSE, CAPILLARY: Glucose-Capillary: 146 mg/dL — ABNORMAL HIGH (ref 70–99)

## 2018-12-30 SURGERY — COLONOSCOPY WITH PROPOFOL
Anesthesia: General

## 2018-12-30 MED ORDER — LIDOCAINE HCL (CARDIAC) PF 100 MG/5ML IV SOSY
PREFILLED_SYRINGE | INTRAVENOUS | Status: DC | PRN
  Administered 2018-12-30: 60 mg via INTRAVENOUS

## 2018-12-30 MED ORDER — PROPOFOL 500 MG/50ML IV EMUL
INTRAVENOUS | Status: AC
  Filled 2018-12-30: qty 50

## 2018-12-30 MED ORDER — PROPOFOL 10 MG/ML IV BOLUS
INTRAVENOUS | Status: DC | PRN
  Administered 2018-12-30: 40 mg via INTRAVENOUS
  Administered 2018-12-30: 50 mg via INTRAVENOUS
  Administered 2018-12-30: 30 mg via INTRAVENOUS
  Administered 2018-12-30: 50 mg via INTRAVENOUS
  Administered 2018-12-30: 30 mg via INTRAVENOUS

## 2018-12-30 MED ORDER — LIDOCAINE HCL (PF) 1 % IJ SOLN
INTRAMUSCULAR | Status: AC
  Filled 2018-12-30: qty 2

## 2018-12-30 MED ORDER — SODIUM CHLORIDE 0.9 % IV SOLN
INTRAVENOUS | Status: DC
  Administered 2018-12-30: 08:00:00 via INTRAVENOUS

## 2018-12-30 MED ORDER — LIDOCAINE HCL (PF) 2 % IJ SOLN
INTRAMUSCULAR | Status: AC
  Filled 2018-12-30: qty 10

## 2018-12-30 MED ORDER — PROPOFOL 500 MG/50ML IV EMUL
INTRAVENOUS | Status: DC | PRN
  Administered 2018-12-30: 80 ug/kg/min via INTRAVENOUS

## 2018-12-30 NOTE — Anesthesia Preprocedure Evaluation (Signed)
Anesthesia Evaluation  Patient identified by MRN, date of birth, ID band Patient awake    Reviewed: Allergy & Precautions, H&P , NPO status , Patient's Chart, lab work & pertinent test results, reviewed documented beta blocker date and time   Airway Mallampati: II   Neck ROM: full    Dental  (+) Poor Dentition   Pulmonary shortness of breath and with exertion,    Pulmonary exam normal        Cardiovascular Exercise Tolerance: Good hypertension, On Medications negative cardio ROS Normal cardiovascular exam Rhythm:regular Rate:Normal     Neuro/Psych  Neuromuscular disease negative psych ROS   GI/Hepatic Neg liver ROS, GERD  Medicated,  Endo/Other  negative endocrine ROSdiabetes  Renal/GU Renal disease  negative genitourinary   Musculoskeletal   Abdominal   Peds  Hematology negative hematology ROS (+)   Anesthesia Other Findings Past Medical History: No date: Arthritis 2015: Breast cancer ([place])     Comment:  chemo and radiation 05-20-13: Cancer ([place])     Comment:  T1 N1 ER positive/PR negative HER2 negative. right               breast No date: Diabetes mellitus without complication (HCC) No date: GERD (gastroesophageal reflux disease) No date: Hypertension No date: PE (pulmonary embolism) 2015: Personal history of chemotherapy     Comment:  Right breast 2015: Personal history of radiation therapy     Comment:  Right breast No date: Renal calculi Past Surgical History: 04-05-13: BREAST BIOPSY; Right     Comment:  positive 05/20/13: BREAST SURGERY; Right     Comment:  mastectomy 1991: CESAREAN SECTION 2015: MASTECTOMY; Right     Comment:  chemo and radiation BMI    Body Mass Index: 29.48 kg/m     Reproductive/Obstetrics negative OB ROS                             Anesthesia Physical Anesthesia Plan  ASA: III  Anesthesia Plan: General   Post-op Pain Management:     Induction:   PONV Risk Score and Plan:   Airway Management Planned:   Additional Equipment:   Intra-op Plan:   Post-operative Plan:   Informed Consent: I have reviewed the patients History and Physical, chart, labs and discussed the procedure including the risks, benefits and alternatives for the proposed anesthesia with the patient or authorized representative who has indicated his/her understanding and acceptance.     Dental Advisory Given  Plan Discussed with: CRNA  Anesthesia Plan Comments:         Anesthesia Quick Evaluation

## 2018-12-30 NOTE — Op Note (Addendum)
[facility] Gastroenterology Patient Name: Abigail Wheeler Procedure Date: 12/30/2018 8:00 AM MRN: [id number] Account #: [id number] Date of Birth: 15-May-1950 Admit Type: Outpatient Age: 68 Room: [facility] ENDO ROOM 4 Gender: Female Note Status: Finalized Procedure:            Colonoscopy Indications:          Screening for colorectal malignant neoplasm Providers:            Darlean Warmoth B. Bonna Gains MD, MD Referring MD:         Lavera Guise, MD (Referring MD) Medicines:            Monitored Anesthesia Care Complications:        No immediate complications. Procedure:            Pre-Anesthesia Assessment:                       - ASA Grade Assessment: II - A patient with mild                        systemic disease.                       - Prior to the procedure, a History and Physical was                        performed, and patient medications, allergies and                        sensitivities were reviewed. The patient's tolerance of                        previous anesthesia was reviewed.                       - The risks and benefits of the procedure and the                        sedation options and risks were discussed with the                        patient. All questions were answered and informed                        consent was obtained.                       - Patient identification and proposed procedure were                        verified prior to the procedure by the physician, the                        nurse, the anesthesiologist, the anesthetist and the                        technician. The procedure was verified in the procedure                        room.  After obtaining informed consent, the colonoscope was                        passed under direct vision. Throughout the procedure,                        the patient's blood pressure, pulse, and oxygen                        saturations were monitored continuously. The                    Colonoscope was introduced through the anus and                        advanced to the the cecum, identified by appendiceal                        orifice and ileocecal valve. The colonoscopy was                        performed with ease. The patient tolerated the                        procedure well. The quality of the bowel preparation                        was good except the ascending colon was fair. Cleaned                        with water and suctioning. Findings:      The perianal and digital rectal examinations were normal.      A 6 mm polyp was found in the sigmoid colon. The polyp was sessile. The       polyp was removed with a cold snare. Resection and retrieval were       complete.      A 11 mm polyp was found in the rectum. The polyp was sessile. The polyp       was removed with a hot snare. Resection and retrieval were complete.      Multiple diverticula were found in the sigmoid colon and ascending colon.      The exam was otherwise without abnormality.      The rectum, sigmoid colon, descending colon, transverse colon, ascending       colon and cecum appeared normal.      Non-bleeding internal hemorrhoids were found during retroflexion. Impression:           - One 6 mm polyp in the sigmoid colon, removed with a                        cold snare. Resected and retrieved.                       - One 11 mm polyp in the rectum, removed with a hot                        snare. Resected and retrieved.                       - Diverticulosis in the sigmoid  colon and in the                        ascending colon.                       - The examination was otherwise normal.                       - The rectum, sigmoid colon, descending colon,                        transverse colon, ascending colon and cecum are normal.                       - The distal rectum and anal verge are normal on                        retroflexion view. Recommendation:       -  Discharge patient to home (with escort).                       - Advance diet as tolerated.                       - Continue present medications.                       - Await pathology results.                       - Repeat colonoscopy in 1 year due to fair prep in                        ascending colon (cleaned with water and suctioning).                       - The findings and recommendations were discussed with                        the patient.                       - The findings and recommendations were discussed with                        the patient's family.                       - Return to primary care physician as previously                        scheduled.                       - High fiber diet. Procedure Code(s):    --- Professional ---                       [phone], Colonoscopy, flexible; with removal of tumor(s),                        polyp(s), or other lesion(s) by snare technique Diagnosis Code(s):    --- Professional ---  Z12.11, Encounter for screening for malignant neoplasm                        of colon                       K63.5, Polyp of colon                       K62.1, Rectal polyp                       K57.30, Diverticulosis of large intestine without                        perforation or abscess without bleeding CPT copyright 2019 American Medical Association. All rights reserved. The codes documented in this report are preliminary and upon coder review may  be revised to meet current compliance requirements.  Vonda Antigua, MD Margretta Sidle B. Bonna Gains MD, MD 12/30/2018 9:04:01 AM This report has been signed electronically. Number of Addenda: 0 Note Initiated On: 12/30/2018 8:00 AM Scope Withdrawal Time: 0 hours 31 minutes 55 seconds  Total Procedure Duration: 0 hours 37 minutes 1 second  Estimated Blood Loss: Estimated blood loss: none.      [facility]

## 2018-12-30 NOTE — Anesthesia Post-op Follow-up Note (Signed)
Anesthesia QCDR form completed.        

## 2018-12-30 NOTE — Transfer of Care (Signed)
Immediate Anesthesia Transfer of Care Note  Patient: SENOVIA VANKOEVERING  Procedure(s) Performed: COLONOSCOPY WITH PROPOFOL (N/A )  Patient Location: PACU  Anesthesia Type:General  Level of Consciousness: sedated  Airway & Oxygen Therapy: Patient Spontanous Breathing and Patient connected to nasal cannula oxygen  Post-op Assessment: Report given to RN and Post -op Vital signs reviewed and stable  Post vital signs: Reviewed and stable  Last Vitals:  Vitals Value Taken Time  BP 116/57 12/30/18 0901  Temp 37.1 C 12/30/18 0900  Pulse 84 12/30/18 0903  Resp 19 12/30/18 0903  SpO2 95 % 12/30/18 0903  Vitals shown include unvalidated device data.  Last Pain:  Vitals:   12/30/18 0900  TempSrc: Tympanic         Complications: No apparent anesthesia complications

## 2018-12-30 NOTE — H&P (Signed)
Abigail Antigua, MD [address], [place], [place], [place], [address] [address], [place], [place], [place], [address] Phone: [phone]  Fax: [phone]  Primary Care Physician:  Lavera Guise, MD   Pre-Procedure History & Physical: HPI:  Abigail Wheeler is a 68 y.o. female is here for a colonoscopy.   Past Medical History:  Diagnosis Date  . Arthritis   . Breast cancer ([place]) 2015   chemo and radiation  . Cancer ([place]) 05-20-13   T1 N1 ER positive/PR negative HER2 negative. right breast  . Diabetes mellitus without complication ([place])   . GERD (gastroesophageal reflux disease)   . Hypertension   . PE (pulmonary embolism)   . Personal history of chemotherapy 2015   Right breast  . Personal history of radiation therapy 2015   Right breast  . Renal calculi     Past Surgical History:  Procedure Laterality Date  . BREAST BIOPSY Right 04-05-13   positive  . BREAST SURGERY Right 05/20/13   mastectomy  . CESAREAN SECTION  1991  . MASTECTOMY Right 2015   chemo and radiation    Prior to Admission medications   Medication Sig Start Date End Date Taking? Authorizing Provider  losartan (COZAAR) 25 MG tablet Take 1 tablet (25 mg total) by mouth daily. 09/16/18  Yes Ronnell Freshwater, NP  metFORMIN (GLUCOPHAGE-XR) 500 MG 24 hr tablet Take 1 tablet (500 mg total) by mouth daily with breakfast. 09/16/18  Yes Boscia, Heather E, NP  omeprazole (PRILOSEC) 20 MG capsule Take 1 capsule (20 mg total) by mouth daily. 09/24/18  Yes Boscia, Heather E, NP  potassium chloride SA (K-DUR,KLOR-CON) 20 MEQ tablet Take 1 tablet (20 mEq total) by mouth daily. 09/26/15  Yes Corcoran, Drue Second, MD  acetaminophen (TYLENOL) 500 MG tablet Take 500 mg by mouth every 6 (six) hours as needed.    [provider]  calcium carbonate (OS-CAL) 600 MG TABS tablet Take 600 mg by mouth daily.    [provider]  Cholecalciferol (VITAMIN D-3) 1000 UNITS CAPS Take 1 capsule by mouth daily.     [provider]  letrozole (FEMARA) 2.5 MG tablet Take 1 tablet (2.5 mg total) by mouth daily. 08/12/18   Ronnell Freshwater, NP    Allergies as of 12/17/2018  . (No Known Allergies)    Family History  Problem Relation Age of Onset  . Lung cancer Father   . Breast cancer Maternal Grandmother 68    Social History   Socioeconomic History  . Marital status: Married    Spouse name: Not on file  . Number of children: Not on file  . Years of education: Not on file  . Highest education level: Not on file  Occupational History  . Not on file  Social Needs  . Financial resource strain: Not on file  . Food insecurity    Worry: Not on file    Inability: Not on file  . Transportation needs    Medical: Not on file    Non-medical: Not on file  Tobacco Use  . Smoking status: Never Smoker  . Smokeless tobacco: Never Used  Substance and Sexual Activity  . Alcohol use: No  . Drug use: No  . Sexual activity: Yes  Lifestyle  . Physical activity    Days per week: Not on file    Minutes per session: Not on file  . Stress: Not on file  Relationships  . Social connections    Talks  on phone: Not on file    Gets together: Not on file    Attends religious service: Not on file    Active member of club or organization: Not on file    Attends meetings of clubs or organizations: Not on file    Relationship status: Not on file  . Intimate partner violence    Fear of current or ex partner: Not on file    Emotionally abused: Not on file    Physically abused: Not on file    Forced sexual activity: Not on file  Other Topics Concern  . Not on file  Social History Narrative  . Not on file    Review of Systems: See HPI, otherwise negative ROS  Physical Exam: BP (!) 150/78   Pulse 92   Temp 98.8 F (37.1 C) (Tympanic)   Ht _0  (1.549 m)   Wt 70.8 kg   SpO2 99%   BMI 29.48 kg/m  General:   Alert,  pleasant and cooperative in NAD Head:  Normocephalic and atraumatic. Neck:   Supple; no masses or thyromegaly. Lungs:  Clear throughout to auscultation, normal respiratory effort.    Heart:  +S1, +S2, Regular rate and rhythm, No edema. Abdomen:  Soft, nontender and nondistended. Normal bowel sounds, without guarding, and without rebound.   Neurologic:  Alert and  oriented x4;  grossly normal neurologically.  Impression/Plan: Abigail Wheeler is here for a colonoscopy to be performed for average risk screening.  Risks, benefits, limitations, and alternatives regarding  colonoscopy have been reviewed with the patient.  Questions have been answered.  All parties agreeable.   Virgel Manifold, MD  12/30/2018, 8:09 AM

## 2018-12-31 ENCOUNTER — Encounter: Payer: Self-pay | Admitting: Gastroenterology

## 2018-12-31 LAB — SURGICAL PATHOLOGY

## 2018-12-31 NOTE — Anesthesia Postprocedure Evaluation (Signed)
Anesthesia Post Note  Patient: Abigail Wheeler  Procedure(s) Performed: COLONOSCOPY WITH PROPOFOL (N/A )  Patient location during evaluation: PACU Anesthesia Type: General Level of consciousness: awake and alert Pain management: pain level controlled Vital Signs Assessment: post-procedure vital signs reviewed and stable Respiratory status: spontaneous breathing, nonlabored ventilation, respiratory function stable and patient connected to nasal cannula oxygen Cardiovascular status: blood pressure returned to baseline and stable Postop Assessment: no apparent nausea or vomiting Anesthetic complications: no     Last Vitals:  Vitals:   12/30/18 0910 12/30/18 0920  BP: (!) 141/71 123/65  Pulse:  78  Resp: 18 14  Temp:    SpO2: 100% 100%    Last Pain:  Vitals:   12/31/18 0738  TempSrc:   PainSc: 0-No pain                 Molli Barrows

## 2019-01-04 DIAGNOSIS — Z23 Encounter for immunization: Secondary | ICD-10-CM | POA: Diagnosis not present

## 2019-01-05 ENCOUNTER — Encounter: Payer: Self-pay | Admitting: Gastroenterology

## 2019-01-08 DIAGNOSIS — E119 Type 2 diabetes mellitus without complications: Secondary | ICD-10-CM | POA: Diagnosis not present

## 2019-01-18 ENCOUNTER — Other Ambulatory Visit: Payer: Self-pay

## 2019-01-18 MED ORDER — OMEPRAZOLE 20 MG PO CPDR: 20.0000 mg | DELAYED_RELEASE_CAPSULE | Freq: Every day | ORAL | 3 refills | Status: DC

## 2019-02-15 ENCOUNTER — Other Ambulatory Visit: Payer: Self-pay

## 2019-02-15 DIAGNOSIS — Z794 Long term (current) use of insulin: Secondary | ICD-10-CM

## 2019-02-15 DIAGNOSIS — E119 Type 2 diabetes mellitus without complications: Secondary | ICD-10-CM

## 2019-02-15 MED ORDER — METFORMIN HCL ER 500 MG PO TB24: 500.0000 mg | ORAL_TABLET | Freq: Every day | ORAL | 5 refills | Status: DC

## 2019-02-21 NOTE — Progress Notes (Signed)
Follow up visit scheduled 02/23/2019. Will order appropriate testing

## 2019-02-23 ENCOUNTER — Other Ambulatory Visit: Payer: Self-pay

## 2019-02-23 ENCOUNTER — Ambulatory Visit: Payer: Medicare Other | Admitting: Internal Medicine

## 2019-02-23 ENCOUNTER — Encounter: Payer: Self-pay | Admitting: Internal Medicine

## 2019-02-23 VITALS — BP 138/80 | HR 82 | Temp 97.6°F | Resp 16 | Ht 61.0 in | Wt 156.0 lb

## 2019-02-23 DIAGNOSIS — C50911 Malignant neoplasm of unspecified site of right female breast: Secondary | ICD-10-CM

## 2019-02-23 DIAGNOSIS — E1165 Type 2 diabetes mellitus with hyperglycemia: Secondary | ICD-10-CM

## 2019-02-23 DIAGNOSIS — D126 Benign neoplasm of colon, unspecified: Secondary | ICD-10-CM | POA: Diagnosis not present

## 2019-02-23 DIAGNOSIS — I10 Essential (primary) hypertension: Secondary | ICD-10-CM | POA: Diagnosis not present

## 2019-02-23 LAB — POCT GLYCOSYLATED HEMOGLOBIN (HGB A1C): Hemoglobin A1C: 6 % — AB (ref 4.0–5.6)

## 2019-02-23 NOTE — Progress Notes (Signed)
[facility] [place], [place] [address]  Internal MEDICINE  Office Visit Note  Patient Name: Abigail Wheeler  [id number]  [id number]  Date of Service: 02/23/2019  Chief Complaint  Patient presents with  . Diabetes  . Hypertension    HPI Pt is here for routine follow up. Feels well. Recent BMD by oncology did show osteopenia. She is on calcium/vitd and letrozole ( right breast cancer). Her colonoscopy showed tubulovillous adenoma and will need colonoscopy in one year. DM and HTN is under good control. Her husband is scheduled for back surgery and she is stressed about it  Current Medication: Outpatient Encounter Medications as of 02/23/2019  Medication Sig  . acetaminophen (TYLENOL) 500 MG tablet Take 500 mg by mouth every 6 (six) hours as needed.  . calcium carbonate (OS-CAL) 600 MG TABS tablet Take 600 mg by mouth daily.  . Cholecalciferol (VITAMIN D-3) 1000 UNITS CAPS Take 1 capsule by mouth daily.  Marland Kitchen letrozole (FEMARA) 2.5 MG tablet Take 1 tablet (2.5 mg total) by mouth daily.  Marland Kitchen losartan (COZAAR) 25 MG tablet Take 1 tablet (25 mg total) by mouth daily.  . metFORMIN (GLUCOPHAGE-XR) 500 MG 24 hr tablet Take 1 tablet (500 mg total) by mouth daily with breakfast.  . omeprazole (PRILOSEC) 20 MG capsule Take 1 capsule (20 mg total) by mouth daily.  . potassium chloride SA (K-DUR,KLOR-CON) 20 MEQ tablet Take 1 tablet (20 mEq total) by mouth daily.   No facility-administered encounter medications on file as of 02/23/2019.     Surgical History: Past Surgical History:  Procedure Laterality Date  . BREAST BIOPSY Right 04-05-13   positive  . BREAST SURGERY Right 05/20/13   mastectomy  . CESAREAN SECTION  1991  . COLONOSCOPY WITH PROPOFOL N/A 12/30/2018   Procedure: COLONOSCOPY WITH PROPOFOL;  Surgeon: Virgel Manifold, MD;  Location: [facility] ENDOSCOPY;  Service: Endoscopy;  Laterality: N/A;  . MASTECTOMY Right 2015   chemo and radiation    Medical  History: Past Medical History:  Diagnosis Date  . Arthritis   . Breast cancer ([place]) 2015   chemo and radiation  . Cancer ([place]) 05-20-13   T1 N1 ER positive/PR negative HER2 negative. right breast  . Diabetes mellitus without complication ([place])   . GERD (gastroesophageal reflux disease)   . Hypertension   . PE (pulmonary embolism)   . Personal history of chemotherapy 2015   Right breast  . Personal history of radiation therapy 2015   Right breast  . Renal calculi     Family History: Family History  Problem Relation Age of Onset  . Lung cancer Father   . Breast cancer Maternal Grandmother 68    Social History   Socioeconomic History  . Marital status: Married    Spouse name: Not on file  . Number of children: Not on file  . Years of education: Not on file  . Highest education level: Not on file  Occupational History  . Not on file  Social Needs  . Financial resource strain: Not on file  . Food insecurity    Worry: Not on file    Inability: Not on file  . Transportation needs    Medical: Not on file    Non-medical: Not on file  Tobacco Use  . Smoking status: Never Smoker  . Smokeless tobacco: Never Used  Substance and Sexual Activity  . Alcohol use: No  . Drug use: No  . Sexual activity: Yes  Lifestyle  .  Physical activity    Days per week: Not on file    Minutes per session: Not on file  . Stress: Not on file  Relationships  . Social Herbalist on phone: Not on file    Gets together: Not on file    Attends religious service: Not on file    Active member of club or organization: Not on file    Attends meetings of clubs or organizations: Not on file    Relationship status: Not on file  . Intimate partner violence    Fear of current or ex partner: Not on file    Emotionally abused: Not on file    Physically abused: Not on file    Forced sexual activity: Not on file  Other Topics Concern  . Not on file  Social History Narrative  . Not on file    Review of Systems  Constitutional: Negative for chills, diaphoresis and fatigue.  HENT: Negative for ear pain, postnasal drip and sinus pressure.   Eyes: Negative for photophobia, discharge, redness, itching and visual disturbance.  Respiratory: Negative for cough, shortness of breath and wheezing.   Cardiovascular: Negative for chest pain, palpitations and leg swelling.  Gastrointestinal: Negative for abdominal pain, constipation, diarrhea, nausea and vomiting.  Genitourinary: Negative for dysuria and flank pain.  Musculoskeletal: Negative for arthralgias, back pain, gait problem and neck pain.  Skin: Negative for color change.  Allergic/Immunologic: Negative for environmental allergies and food allergies.  Neurological: Negative for dizziness and headaches.  Hematological: Does not bruise/bleed easily.  Psychiatric/Behavioral: Negative for agitation, behavioral problems (depression) and hallucinations.    Vital Signs: BP 138/80   Pulse 82   Temp 97.6 F (36.4 C)   Resp 16   Ht 5' 1" (1.549 m)   Wt 156 lb (70.8 kg)   SpO2 94%   BMI 29.48 kg/m    Physical Exam Constitutional:      General: She is not in acute distress.    Appearance: She is well-developed. She is not diaphoretic.  HENT:     Head: Normocephalic and atraumatic.     Mouth/Throat:     Pharynx: No oropharyngeal exudate.  Eyes:     Pupils: Pupils are equal, round, and reactive to light.  Neck:     Musculoskeletal: Normal range of motion and neck supple.     Thyroid: No thyromegaly.     Vascular: No JVD.     Trachea: No tracheal deviation.  Cardiovascular:     Rate and Rhythm: Normal rate and regular rhythm.     Heart sounds: Normal heart sounds. No murmur. No friction rub. No gallop.   Pulmonary:     Effort: Pulmonary effort is normal. No respiratory distress.     Breath sounds: No wheezing or rales.  Chest:     Chest wall: No tenderness.  Abdominal:     General: Bowel sounds are normal.      Palpations: Abdomen is soft.  Musculoskeletal: Normal range of motion.  Lymphadenopathy:     Cervical: No cervical adenopathy.  Skin:    General: Skin is warm and dry.  Neurological:     Mental Status: She is alert and oriented to person, place, and time.     Cranial Nerves: No cranial nerve deficit.  Psychiatric:        Behavior: Behavior normal.        Thought Content: Thought content normal.        Judgment: Judgment normal.  Assessment/Plan: 1. Type 2 diabetes mellitus with hyperglycemia, unspecified whether long term insulin use (HCC) - controlled with meds  - POCT HgB A1C  2. Essential hypertension - Continue all meds as before   3. Malignant neoplasm of right female breast, unspecified estrogen receptor status, unspecified site of breast [facility]) -Per Oncology  4. Tubulovillous adenoma of colon - Pt will need one year follow up colonoscopy   General Counseling: Adea verbalizes understanding of the findings of todays visit and agrees with plan of treatment. I have discussed any further diagnostic evaluation that may be needed or ordered today. We also reviewed her medications today. she has been encouraged to call the office with any questions or concerns that should arise related to todays visit.  Orders Placed This Encounter  Procedures  . POCT HgB A1C   Time spent: 25 Minutes   Dr Lavera Guise Internal medicine

## 2019-03-04 ENCOUNTER — Other Ambulatory Visit: Payer: Self-pay | Admitting: Nurse Practitioner

## 2019-03-04 MED ORDER — GLUCOSE BLOOD VI STRP: 1.0000 | ORAL_STRIP | 11 refills | Status: DC | PRN

## 2019-03-04 MED ORDER — FREESTYLE LANCETS MISC: 1.0000 | 5 refills | Status: DC | PRN

## 2019-03-17 ENCOUNTER — Other Ambulatory Visit: Payer: Self-pay | Admitting: Nurse Practitioner

## 2019-03-17 ENCOUNTER — Other Ambulatory Visit: Payer: Self-pay | Admitting: Internal Medicine

## 2019-03-17 DIAGNOSIS — Z1231 Encounter for screening mammogram for malignant neoplasm of breast: Secondary | ICD-10-CM

## 2019-03-22 ENCOUNTER — Other Ambulatory Visit: Payer: Self-pay

## 2019-03-22 MED ORDER — LOSARTAN POTASSIUM 25 MG PO TABS: 25.0000 mg | ORAL_TABLET | Freq: Every day | ORAL | 3 refills | Status: DC

## 2019-04-07 ENCOUNTER — Other Ambulatory Visit: Payer: Self-pay

## 2019-04-07 MED ORDER — GLUCOSE BLOOD VI STRP: 1.0000 | ORAL_STRIP | 11 refills | Status: DC | PRN

## 2019-04-09 ENCOUNTER — Other Ambulatory Visit: Payer: Self-pay

## 2019-04-09 MED ORDER — GLUCOSE BLOOD VI STRP: 1.0000 | ORAL_STRIP | 11 refills | Status: DC | PRN

## 2019-04-19 ENCOUNTER — Other Ambulatory Visit: Payer: Self-pay | Admitting: Internal Medicine

## 2019-04-29 ENCOUNTER — Ambulatory Visit
Admission: RE | Admit: 2019-04-29 | Discharge: 2019-04-29 | Disposition: A | Discharge status: Home / Self Care | Payer: Medicare Other | Source: Ambulatory Visit | Attending: Nurse Practitioner | Admitting: Nurse Practitioner

## 2019-04-29 DIAGNOSIS — Z1231 Encounter for screening mammogram for malignant neoplasm of breast: Secondary | ICD-10-CM | POA: Diagnosis not present

## 2019-04-30 NOTE — Progress Notes (Signed)
Negative mammogram

## 2019-05-10 ENCOUNTER — Ambulatory Visit: Payer: Medicare Other | Admitting: Surgery

## 2019-05-13 ENCOUNTER — Other Ambulatory Visit: Payer: Self-pay

## 2019-05-13 ENCOUNTER — Encounter: Payer: Self-pay | Admitting: Oncology

## 2019-05-13 ENCOUNTER — Inpatient Hospital Stay: Payer: Medicare Other | Attending: Oncology | Admitting: Oncology

## 2019-05-13 VITALS — BP 143/72 | HR 100 | Temp 99.0°F | Resp 16 | Wt 158.3 lb

## 2019-05-13 DIAGNOSIS — Z79811 Long term (current) use of aromatase inhibitors: Secondary | ICD-10-CM | POA: Diagnosis not present

## 2019-05-13 DIAGNOSIS — Z79899 Other long term (current) drug therapy: Secondary | ICD-10-CM | POA: Insufficient documentation

## 2019-05-13 DIAGNOSIS — C50911 Malignant neoplasm of unspecified site of right female breast: Secondary | ICD-10-CM | POA: Insufficient documentation

## 2019-05-13 DIAGNOSIS — Z853 Personal history of malignant neoplasm of breast: Secondary | ICD-10-CM | POA: Diagnosis not present

## 2019-05-13 DIAGNOSIS — Z170 Estrogen receptor positive status [ER+]: Secondary | ICD-10-CM | POA: Insufficient documentation

## 2019-05-13 DIAGNOSIS — Z9221 Personal history of antineoplastic chemotherapy: Secondary | ICD-10-CM | POA: Insufficient documentation

## 2019-05-13 DIAGNOSIS — Z9011 Acquired absence of right breast and nipple: Secondary | ICD-10-CM | POA: Diagnosis not present

## 2019-05-13 DIAGNOSIS — Z7901 Long term (current) use of anticoagulants: Secondary | ICD-10-CM | POA: Insufficient documentation

## 2019-05-13 DIAGNOSIS — Z803 Family history of malignant neoplasm of breast: Secondary | ICD-10-CM | POA: Insufficient documentation

## 2019-05-13 DIAGNOSIS — Z923 Personal history of irradiation: Secondary | ICD-10-CM | POA: Insufficient documentation

## 2019-05-13 DIAGNOSIS — Z86718 Personal history of other venous thrombosis and embolism: Secondary | ICD-10-CM | POA: Diagnosis not present

## 2019-05-13 DIAGNOSIS — Z801 Family history of malignant neoplasm of trachea, bronchus and lung: Secondary | ICD-10-CM | POA: Diagnosis not present

## 2019-05-13 DIAGNOSIS — Z08 Encounter for follow-up examination after completed treatment for malignant neoplasm: Secondary | ICD-10-CM | POA: Diagnosis not present

## 2019-05-13 NOTE — Progress Notes (Signed)
Pt has no complaints from breast cancer.

## 2019-05-14 ENCOUNTER — Encounter: Payer: Self-pay | Admitting: Oncology

## 2019-05-14 NOTE — Progress Notes (Signed)
Hematology/Oncology Consult note [facility]  [phone] [phone]  Patient Care Team: Lavera Guise, MD as PCP - General (Internal Medicine) Christene Lye, MD (General Surgery) Lavera Guise, MD (Internal Medicine) Lequita Asal, MD as Referring Physician (Hematology and Oncology)   Name of the patient: Abigail Wheeler  [id number]  [birth year]   Date of visit: 05/14/19  Diagnosis- history of stage IIIb right breast cancer currently on Femara   Chief complaint/ Reason for visit-routine follow-up of breast cancer  Heme/Onc history: Patient is a 69 year old female who sees Dr. Mike Gip for her history of stage IIIb right breast cancer which was diagnosed in January 2015. She is status post right mastectomy and sentinel lymph node biopsy. Pathology showed grade 1 lobular carcinoma multifocal, largest focus was 1.7 cm invading the pectoralis muscle additional foci were 1.4 cm and 1 mm respectively. Sentinel lymph node was also positive with extracapsular extension. Tumor was ER PR positive and HER-2/neu negative. Patient received dose dense AC followed by 12 weeks of Taxol. She also received adjuvant radiation to her chest and axilla. She then started taking Femara. Patient also diagnosed with multiple left lower lobe pulmonary emboli in April 2015. Hypercoagulable work-up was negative and patient is on Xarelto. Bone density scan in May 2018 showed osteopenia with a score of -1.9. Patient was not a candidate for FRAX score due to Femara.   Interval history-patient has issues with tendinitis in her left hand.  Otherwise tolerating Femara well without any significant side effects.  Appetite and weight have remained stable.  Denies any new aches or pains anywhere  ECOG PS- 1 Pain scale- 0  Review of systems- Review of Systems  Constitutional: Negative for chills, fever, malaise/fatigue and weight loss.  HENT: Negative  for congestion, ear discharge and nosebleeds.   Eyes: Negative for blurred vision.  Respiratory: Negative for cough, hemoptysis, sputum production, shortness of breath and wheezing.   Cardiovascular: Negative for chest pain, palpitations, orthopnea and claudication.  Gastrointestinal: Negative for abdominal pain, blood in stool, constipation, diarrhea, heartburn, melena, nausea and vomiting.  Genitourinary: Negative for dysuria, flank pain, frequency, hematuria and urgency.  Musculoskeletal: Negative for back pain, joint pain and myalgias.  Skin: Negative for rash.  Neurological: Negative for dizziness, tingling, focal weakness, seizures, weakness and headaches.  Endo/Heme/Allergies: Does not bruise/bleed easily.  Psychiatric/Behavioral: Negative for depression and suicidal ideas. The patient does not have insomnia.       No Known Allergies   Past Medical History:  Diagnosis Date  . Arthritis   . Breast cancer ([place]) 2015   chemo and radiation  . Cancer ([place]) 05-20-13   T1 N1 ER positive/PR negative HER2 negative. right breast  . Diabetes mellitus without complication ([place])   . GERD (gastroesophageal reflux disease)   . Hypertension   . PE (pulmonary embolism)   . Personal history of chemotherapy 2015   Right breast  . Personal history of radiation therapy 2015   Right breast  . Renal calculi      Past Surgical History:  Procedure Laterality Date  . BREAST BIOPSY Right 04-05-13   positive  . BREAST SURGERY Right 05/20/13   mastectomy  . CESAREAN SECTION  1991  . COLONOSCOPY WITH PROPOFOL N/A 12/30/2018   Procedure: COLONOSCOPY WITH PROPOFOL;  Surgeon: Virgel Manifold, MD;  Location: [facility] ENDOSCOPY;  Service: Endoscopy;  Laterality: N/A;  . MASTECTOMY Right 2015   chemo and radiation    Social History  Socioeconomic History  . Marital status: Married    Spouse name: Not on file  . Number of children: Not on file  . Years of education: Not on file  . Highest  education level: Not on file  Occupational History  . Not on file  Tobacco Use  . Smoking status: Never Smoker  . Smokeless tobacco: Never Used  Substance and Sexual Activity  . Alcohol use: No  . Drug use: No  . Sexual activity: Yes  Other Topics Concern  . Not on file  Social History Narrative  . Not on file   Social Determinants of Health   Financial Resource Strain:   . Difficulty of Paying Living Expenses: Not on file  Food Insecurity:   . Worried About Charity fundraiser in the Last Year: Not on file  . Ran Out of Food in the Last Year: Not on file  Transportation Needs:   . Lack of Transportation (Medical): Not on file  . Lack of Transportation (Non-Medical): Not on file  Physical Activity:   . Days of Exercise per Week: Not on file  . Minutes of Exercise per Session: Not on file  Stress:   . Feeling of Stress : Not on file  Social Connections:   . Frequency of Communication with Friends and Family: Not on file  . Frequency of Social Gatherings with Friends and Family: Not on file  . Attends Religious Services: Not on file  . Active Member of Clubs or Organizations: Not on file  . Attends Archivist Meetings: Not on file  . Marital Status: Not on file  Intimate Partner Violence:   . Fear of Current or Ex-Partner: Not on file  . Emotionally Abused: Not on file  . Physically Abused: Not on file  . Sexually Abused: Not on file    Family History  Problem Relation Age of Onset  . Lung cancer Father   . Breast cancer Maternal Grandmother 41     Current Outpatient Medications:  .  acetaminophen (TYLENOL) 500 MG tablet, Take 500 mg by mouth every 6 (six) hours as needed., Disp: , Rfl:  .  calcium carbonate (OS-CAL) 600 MG TABS tablet, Take 600 mg by mouth daily., Disp: , Rfl:  .  Cholecalciferol (VITAMIN D-3) 1000 UNITS CAPS, Take 1 capsule by mouth daily., Disp: , Rfl:  .  glucose blood test strip, 1 each by Other route as needed for other. Use as  instructed dx E11.65, Disp: 100 each, Rfl: 11 .  Lancets (FREESTYLE) lancets, 1 each by Other route as needed for other. Use as instructed DX E11.65, Disp: 100 each, Rfl: 5 .  letrozole (FEMARA) 2.5 MG tablet, Take 1 tablet (2.5 mg total) by mouth daily., Disp: 30 tablet, Rfl: 12 .  losartan (COZAAR) 25 MG tablet, TAKE 1 TABLET BY MOUTH EVERY DAY, Disp: 90 tablet, Rfl: 1 .  metFORMIN (GLUCOPHAGE-XR) 500 MG 24 hr tablet, Take 1 tablet (500 mg total) by mouth daily with breakfast., Disp: 30 tablet, Rfl: 5 .  omeprazole (PRILOSEC) 20 MG capsule, Take 1 capsule (20 mg total) by mouth daily., Disp: 30 capsule, Rfl: 3  Physical exam:  Vitals:   05/13/19 1101  BP: (!) 143/72  Pulse: 100  Resp: 16  Temp: 99 F (37.2 C)  TempSrc: Tympanic  Weight: 158 lb 4.8 oz (71.8 kg)   Physical Exam HENT:     Head: Normocephalic and atraumatic.  Eyes:     Pupils: Pupils are equal,  round, and reactive to light.  Cardiovascular:     Rate and Rhythm: Normal rate and regular rhythm.     Heart sounds: Normal heart sounds.  Pulmonary:     Effort: Pulmonary effort is normal.     Breath sounds: Normal breath sounds.  Abdominal:     General: Bowel sounds are normal.     Palpations: Abdomen is soft.  Musculoskeletal:     Cervical back: Normal range of motion.  Skin:    General: Skin is warm and dry.  Neurological:     Mental Status: She is alert and oriented to person, place, and time.   Patient is s/p right mastectomy without reconstruction and a well-healed surgical scar.  No evidence of chest wall recurrence.  No palpable masses in the left breast.  No palpable bilateral axillary adenopathy.  CMP Latest Ref Rng & Units 11/03/2018  Glucose 70 - 99 mg/dL 141(H)  BUN 8 - 23 mg/dL 14  Creatinine 0.44 - 1.00 mg/dL 0.99  Sodium 135 - 145 mmol/L 143  Potassium 3.5 - 5.1 mmol/L 3.5  Chloride 98 - 111 mmol/L 107  CO2 22 - 32 mmol/L 27  Calcium 8.9 - 10.3 mg/dL 10.0  Total Protein 6.5 - 8.1 g/dL 7.9    Total Bilirubin 0.3 - 1.2 mg/dL 0.6  Alkaline Phos 38 - 126 U/L 54  AST 15 - 41 U/L 26  ALT 0 - 44 U/L 32   CBC Latest Ref Rng & Units 11/03/2018  WBC 4.0 - 10.5 K/uL 4.4  Hemoglobin 12.0 - 15.0 g/dL 13.2  Hematocrit 36.0 - 46.0 % 38.8  Platelets 150 - 400 K/uL 216    No images are attached to the encounter.  MM 3D SCREEN BREAST UNI LEFT  Result Date: 04/29/2019 CLINICAL DATA:  Screening. EXAM: DIGITAL SCREENING UNILATERAL LEFT MAMMOGRAM WITH CAD AND TOMO COMPARISON:  Previous exam(s). ACR Breast Density Category b: There are scattered areas of fibroglandular density. FINDINGS: The patient has had a right mastectomy. There are no findings suspicious for malignancy. Images were processed with CAD. IMPRESSION: No mammographic evidence of malignancy. A result letter of this screening mammogram will be mailed directly to the patient. RECOMMENDATION: Screening mammogram in one year.  (Code:SM-L-44M) BI-RADS CATEGORY  1: Negative. Electronically Signed   By: Claudie Revering M.D.   On: 04/29/2019 17:24     Assessment and plan- Patient is a 69 y.o. female with history of stage IIIB right breast cancer s/p adjuvant chemotherapy radiation treatment currently on Femara  Clinically patient is doing well and no concerning signs and symptoms of recurrence based on today's exam.  She will continue to take Femara for another 5 years given her high risk breast cancer.  She seems to be tolerating it well without any significant side effects along with calcium and vitamin D.  She did have a bone density scan July 2020 which did show osteopenia however her 10-year risk of Major osteoporotic risk fracture as well as hip fracture was less than 20% and 3% respectively.  She therefore does not require any adjuvant bisphosphonates.  I will see her back in 1 year no labs.  Her annual mammograms are being coordinated by her primary care doctor surveillance breast cancer   Visit Diagnosis 1. Encounter for follow-up  surveillance of breast cancer   2. Use of letrozole (Femara)      Dr. Randa Evens, MD, MPH [facility] [phone] 05/14/2019 12:52 PM

## 2019-05-19 ENCOUNTER — Ambulatory Visit (INDEPENDENT_AMBULATORY_CARE_PROVIDER_SITE_OTHER): Payer: Medicare Other | Admitting: Surgery

## 2019-05-19 ENCOUNTER — Other Ambulatory Visit: Payer: Self-pay

## 2019-05-19 ENCOUNTER — Encounter: Payer: Self-pay | Admitting: Surgery

## 2019-05-19 VITALS — BP 173/92 | HR 112 | Temp 97.9°F | Resp 12 | Ht 61.0 in | Wt 157.0 lb

## 2019-05-19 DIAGNOSIS — Z853 Personal history of malignant neoplasm of breast: Secondary | ICD-10-CM

## 2019-05-19 NOTE — Patient Instructions (Addendum)
You will hear from our office in December 2021 to schedule your mammogram and office visit for January 2022.  Please call the office if you have any questions or concerns.  Breast Self-Awareness Breast self-awareness means being familiar with how your breasts look and feel. It involves checking your breasts regularly and reporting any changes to your health care provider. Practicing breast self-awareness is important. Sometimes changes may not be harmful (are benign), but sometimes a change in your breasts can be a sign of a serious medical problem. It is important to learn how to do this procedure correctly so that you can catch problems early, when treatment is more likely to be successful. All women should practice breast self-awareness, including women who have had breast implants. What you need:  A mirror.  A well-lit room. How to do a breast self-exam A breast self-exam is one way to learn what is normal for your breasts and whether your breasts are changing. To do a breast self-exam: Look for changes  1. Remove all the clothing above your waist. 2. Stand in front of a mirror in a room with good lighting. 3. Put your hands on your hips. 4. Push your hands firmly downward. 5. Compare your breasts in the mirror. Look for differences between them (asymmetry), such as: ? Differences in shape. ? Differences in size. ? Puckers, dips, and bumps in one breast and not the other. 6. Look at each breast for changes in the skin, such as: ? Redness. ? Scaly areas. 7. Look for changes in your nipples, such as: ? Discharge. ? Bleeding. ? Dimpling. ? Redness. ? A change in position. Feel for changes Carefully feel your breasts for lumps and changes. It is best to do this while lying on your back on the floor, and again while sitting or standing in the tub or shower with soapy water on your skin. Feel each breast in the following way: 1. Place the arm on the side of the breast you are  examining above your head. 2. Feel your breast with the other hand. 3. Start in the nipple area and make -inch (2 cm) overlapping circles to feel your breast. Use the pads of your three middle fingers to do this. Apply light pressure, then medium pressure, then firm pressure. The light pressure will allow you to feel the tissue closest to the skin. The medium pressure will allow you to feel the tissue that is a little deeper. The firm pressure will allow you to feel the tissue close to the ribs. 4. Continue the overlapping circles, moving downward over the breast until you feel your ribs below your breast. 5. Move one finger-width toward the center of the body. Continue to use the -inch (2 cm) overlapping circles to feel your breast as you move slowly up toward your collarbone. 6. Continue the up-and-down exam using all three pressures until you reach your armpit.  Write down what you find Writing down what you find can help you remember what to discuss with your health care provider. Write down:  What is normal for each breast.  Any changes that you find in each breast, including: ? The kind of changes you find. ? Any pain or tenderness. ? Size and location of any lumps.  Where you are in your menstrual cycle, if you are still menstruating. General tips and recommendations  Examine your breasts every month.  If you are breastfeeding, the best time to examine your breasts is after a feeding or  after using a breast pump.  If you menstruate, the best time to examine your breasts is 5-7 days after your period. Breasts are generally lumpier during menstrual periods, and it may be more difficult to notice changes.  With time and practice, you will become more familiar with the variations in your breasts and more comfortable with the exam. Contact a health care provider if you:  See a change in the shape or size of your breasts or nipples.  See a change in the skin of your breast or  nipples, such as a reddened or scaly area.  Have unusual discharge from your nipples.  Find a lump or thick area that was not there before.  Have pain in your breasts.  Have any concerns related to your breast health. Summary  Breast self-awareness includes looking for physical changes in your breasts, as well as feeling for any changes within your breasts.  Breast self-awareness should be performed in front of a mirror in a well-lit room.  You should examine your breasts every month. If you menstruate, the best time to examine your breasts is 5-7 days after your menstrual period.  Let your health care provider know of any changes you notice in your breasts, including changes in size, changes on the skin, pain or tenderness, or unusual fluid from your nipples. This information is not intended to replace advice given to you by your health care provider. Make sure you discuss any questions you have with your health care provider. Document Revised: 11/25/2017 Document Reviewed: 11/25/2017 Elsevier Patient Education  [place].

## 2019-05-19 NOTE — Progress Notes (Signed)
Outpatient Surgical Follow Up  05/19/2019  Abigail Wheeler is an 69 y.o. female.   Chief Complaint  Patient presents with  . Follow-up    Breast Cancer-mammo 04/29/19    HPI:  Abigail Wheeler is a 69 year old female well-known to our practice with a history of mastectomy on the right side for invasive carcinoma by Dr. Jamal Collin 6 years ago.  She did receive adjuvant chemotherapy.  She is currently on Femara and Dr. Janese Banks is following her. she is doing very well.  Denies any breast masses.  No nipple discharge.  No fevers no chills no weight loss.  Mammogram personally reviewed showing no evidence of any concerning lesions.  Past Medical History:  Diagnosis Date  . Arthritis   . Breast cancer ([place]) 2015   chemo and radiation  . Cancer ([place]) 05-20-13   T1 N1 ER positive/PR negative HER2 negative. right breast  . Diabetes mellitus without complication ([place])   . GERD (gastroesophageal reflux disease)   . Hypertension   . PE (pulmonary embolism)   . Personal history of chemotherapy 2015   Right breast  . Personal history of radiation therapy 2015   Right breast  . Renal calculi     Past Surgical History:  Procedure Laterality Date  . BREAST BIOPSY Right 04-05-13   positive  . BREAST SURGERY Right 05/20/13   mastectomy  . CESAREAN SECTION  1991  . COLONOSCOPY WITH PROPOFOL N/A 12/30/2018   Procedure: COLONOSCOPY WITH PROPOFOL;  Surgeon: Virgel Manifold, MD;  Location: [facility] ENDOSCOPY;  Service: Endoscopy;  Laterality: N/A;  . MASTECTOMY Right 2015   chemo and radiation    Family History  Problem Relation Age of Onset  . Lung cancer Father   . Breast cancer Maternal Grandmother 68    Social History:  reports that she has never smoked. She has never used smokeless tobacco. She reports that she does not drink alcohol or use drugs.  Allergies: No Known Allergies  Medications reviewed.    ROS Full ROS performed and is otherwise negative other than what is stated in HPI   BP  (!) 173/92   Pulse (!) 112   Temp 97.9 F (36.6 C)   Resp 12   Ht '5\' 1"'$  (1.549 m)   Wt 157 lb (71.2 kg)   SpO2 95%   BMI 29.66 kg/m   Physical Exam Vitals and nursing note reviewed. Exam conducted with a chaperone present.  Constitutional:      General: She is not in acute distress.    Appearance: Normal appearance. She is normal weight.  Eyes:     General: No scleral icterus.       Right eye: No discharge.        Left eye: No discharge.  Cardiovascular:     Rate and Rhythm: Normal rate and regular rhythm.     Heart sounds: No murmur.  Pulmonary:     Effort: Pulmonary effort is normal. No respiratory distress.     Breath sounds: Normal breath sounds. No stridor.     Comments: BREAST: Right mastectomy scar. No masses No LAD. Left breast w/o concerning mass. Abdominal:     General: Abdomen is flat. There is no distension.     Palpations: Abdomen is soft. There is no mass.     Tenderness: There is no abdominal tenderness.     Hernia: No hernia is present.  Musculoskeletal:        General: No swelling or tenderness. Normal range of  motion.  Skin:    General: Skin is warm and dry.     Capillary Refill: Capillary refill takes less than 2 seconds.  Neurological:     General: No focal deficit present.     Mental Status: She is alert and oriented to person, place, and time.  Psychiatric:        Mood and Affect: Mood normal.        Behavior: Behavior normal.        Thought Content: Thought content normal.        Judgment: Judgment normal.         Assessment/Plan:  69 year old female with a history of breast cancer no evidence of local recurrence no evidence of new concerning breast pathology.  We will see her back in 1 year with physical examination mammogram.  Greater than 50% of the 25 minutes  visit was spent in counseling/coordination of care   Caroleen Hamman, MD [place] Surgeon

## 2019-06-01 ENCOUNTER — Other Ambulatory Visit: Payer: Self-pay

## 2019-06-01 MED ORDER — OMEPRAZOLE 20 MG PO CPDR: 20.0000 mg | DELAYED_RELEASE_CAPSULE | Freq: Every day | ORAL | 3 refills | Status: DC

## 2019-06-24 ENCOUNTER — Ambulatory Visit: Payer: Medicare Other | Admitting: Nurse Practitioner

## 2019-07-15 ENCOUNTER — Telehealth: Payer: Self-pay

## 2019-07-15 NOTE — Telephone Encounter (Signed)
CONFIRMED AND SCREENED FOR 07-20-19 OV.

## 2019-07-20 ENCOUNTER — Encounter: Payer: Self-pay | Admitting: Nurse Practitioner

## 2019-07-20 ENCOUNTER — Other Ambulatory Visit: Payer: Self-pay

## 2019-07-20 ENCOUNTER — Ambulatory Visit: Payer: Medicare Other | Admitting: Nurse Practitioner

## 2019-07-20 VITALS — BP 145/81 | HR 103 | Temp 98.1°F | Resp 16 | Ht 61.0 in | Wt 159.0 lb

## 2019-07-20 DIAGNOSIS — E1165 Type 2 diabetes mellitus with hyperglycemia: Secondary | ICD-10-CM | POA: Diagnosis not present

## 2019-07-20 DIAGNOSIS — H9202 Otalgia, left ear: Secondary | ICD-10-CM

## 2019-07-20 DIAGNOSIS — I10 Essential (primary) hypertension: Secondary | ICD-10-CM | POA: Diagnosis not present

## 2019-07-20 LAB — POCT GLYCOSYLATED HEMOGLOBIN (HGB A1C): Hemoglobin A1C: 6.2 % — AB (ref 4.0–5.6)

## 2019-07-20 NOTE — Progress Notes (Signed)
[facility] [address], [place] [address]  Internal MEDICINE  Office Visit Note  Patient Name: Abigail Wheeler  [id number]  [id number]  Date of Service: 08/02/2019  Chief Complaint  Patient presents with  . Hypertension  . Gastroesophageal Reflux  . Headache  . Ear Pain    left ear     The patient is here for routine follow up. Blood pressure and blood sugars are both doing well. Her HgbA1c is 6.2 today. She states that has noted some left ear pain. Feels full at times. This is intermittent. Nothing she can think of makes this better or worse. She denies nasal congestion or headache. She denies sore throat or fever. She is due to have check of routine, fasting labs.       Current Medication: Outpatient Encounter Medications as of 07/20/2019  Medication Sig  . acetaminophen (TYLENOL) 500 MG tablet Take 500 mg by mouth every 6 (six) hours as needed.  . calcium carbonate (OS-CAL) 600 MG TABS tablet Take 600 mg by mouth daily.  . Cholecalciferol (VITAMIN D-3) 1000 UNITS CAPS Take 1 capsule by mouth daily.  Marland Kitchen glucose blood test strip 1 each by Other route as needed for other. Use as instructed dx E11.65  . Lancets (FREESTYLE) lancets 1 each by Other route as needed for other. Use as instructed DX E11.65  . letrozole (FEMARA) 2.5 MG tablet Take 1 tablet (2.5 mg total) by mouth daily.  Marland Kitchen losartan (COZAAR) 25 MG tablet TAKE 1 TABLET BY MOUTH EVERY DAY  . metFORMIN (GLUCOPHAGE-XR) 500 MG 24 hr tablet Take 1 tablet (500 mg total) by mouth daily with breakfast.  . omeprazole (PRILOSEC) 20 MG capsule Take 1 capsule (20 mg total) by mouth daily.  Marland Kitchen pyridoxine (B-6) 100 MG tablet Vitamin B-6 100 mg tablet  Take 1 tablet twice a day by oral route.   No facility-administered encounter medications on file as of 07/20/2019.    Surgical History: Past Surgical History:  Procedure Laterality Date  . BREAST BIOPSY Right 04-05-13   positive  . BREAST SURGERY Right  05/20/13   mastectomy  . CESAREAN SECTION  1991  . COLONOSCOPY WITH PROPOFOL N/A 12/30/2018   Procedure: COLONOSCOPY WITH PROPOFOL;  Surgeon: Virgel Manifold, MD;  Location: [facility] ENDOSCOPY;  Service: Endoscopy;  Laterality: N/A;  . MASTECTOMY Right 2015   chemo and radiation    Medical History: Past Medical History:  Diagnosis Date  . Arthritis   . Breast cancer ([place]) 2015   chemo and radiation  . Cancer ([place]) 05-20-13   T1 N1 ER positive/PR negative HER2 negative. right breast  . Diabetes mellitus without complication ([place])   . GERD (gastroesophageal reflux disease)   . Hypertension   . PE (pulmonary embolism)   . Personal history of chemotherapy 2015   Right breast  . Personal history of radiation therapy 2015   Right breast  . Renal calculi     Family History: Family History  Problem Relation Age of Onset  . Lung cancer Father   . Breast cancer Maternal Grandmother 68    Social History   Socioeconomic History  . Marital status: Married    Spouse name: Not on file  . Number of children: Not on file  . Years of education: Not on file  . Highest education level: Not on file  Occupational History  . Not on file  Tobacco Use  . Smoking status: Never Smoker  . Smokeless tobacco: Never Used  Substance and Sexual Activity  . Alcohol use: No  . Drug use: No  . Sexual activity: Yes  Other Topics Concern  . Not on file  Social History Narrative  . Not on file   Social Determinants of Health   Financial Resource Strain:   . Difficulty of Paying Living Expenses:   Food Insecurity:   . Worried About Charity fundraiser in the Last Year:   . Arboriculturist in the Last Year:   Transportation Needs:   . Film/video editor (Medical):   Marland Kitchen Lack of Transportation (Non-Medical):   Physical Activity:   . Days of Exercise per Week:   . Minutes of Exercise per Session:   Stress:   . Feeling of Stress :   Social Connections:   . Frequency of Communication with  Friends and Family:   . Frequency of Social Gatherings with Friends and Family:   . Attends Religious Services:   . Active Member of Clubs or Organizations:   . Attends Archivist Meetings:   Marland Kitchen Marital Status:   Intimate Partner Violence:   . Fear of Current or Ex-Partner:   . Emotionally Abused:   Marland Kitchen Physically Abused:   . Sexually Abused:       Review of Systems  Constitutional: Negative for activity change, appetite change, chills, fatigue, fever and unexpected weight change.  HENT: Positive for ear pain. Negative for congestion, postnasal drip, rhinorrhea, sneezing and sore throat.        Left ear pain. Intermittent.   Respiratory: Negative for cough, chest tightness, shortness of breath and wheezing.   Cardiovascular: Negative for chest pain and palpitations.  Gastrointestinal: Negative for abdominal pain, constipation, diarrhea, nausea and vomiting.  Endocrine: Negative for cold intolerance, heat intolerance, polydipsia and polyuria.       Blood sugars doing well   Musculoskeletal: Negative for arthralgias, back pain, joint swelling and neck pain.  Skin: Negative for rash.  Allergic/Immunologic: Negative for environmental allergies.  Neurological: Negative for dizziness, tremors, numbness and headaches.  Hematological: Negative for adenopathy. Does not bruise/bleed easily.  Psychiatric/Behavioral: Negative for behavioral problems (Depression), sleep disturbance and suicidal ideas. The patient is not nervous/anxious.     Today's Vitals   07/20/19 1408  BP: (!) 145/81  Pulse: (!) 103  Resp: 16  Temp: 98.1 F (36.7 C)  SpO2: 98%  Weight: 159 lb (72.1 kg)  Height: _0  (1.549 m)   Body mass index is 30.04 kg/m.  Physical Exam Vitals and nursing note reviewed.  Constitutional:      General: She is not in acute distress.    Appearance: Normal appearance. She is well-developed. She is not diaphoretic.  HENT:     Head: Normocephalic and atraumatic.      Right Ear: Ear canal and external ear normal.     Left Ear: Tympanic membrane is bulging.     Ears:     Comments: Small amount of cerumen in the left outer ear canal.     Nose: No congestion or rhinorrhea.     Mouth/Throat:     Pharynx: No oropharyngeal exudate.  Eyes:     Extraocular Movements: Extraocular movements intact.     Pupils: Pupils are equal, round, and reactive to light.  Neck:     Thyroid: No thyromegaly.     Vascular: No carotid bruit or JVD.     Trachea: No tracheal deviation.  Cardiovascular:     Rate and Rhythm: Normal rate  and regular rhythm.     Heart sounds: Normal heart sounds. No murmur. No friction rub. No gallop.   Pulmonary:     Effort: Pulmonary effort is normal. No respiratory distress.     Breath sounds: Normal breath sounds. No wheezing or rales.  Chest:     Chest wall: No tenderness.  Abdominal:     General: Bowel sounds are normal.     Palpations: Abdomen is soft.  Musculoskeletal:        General: Normal range of motion.     Cervical back: Normal range of motion and neck supple.  Lymphadenopathy:     Cervical: No cervical adenopathy.  Skin:    General: Skin is warm and dry.  Neurological:     Mental Status: She is alert and oriented to person, place, and time.     Cranial Nerves: No cranial nerve deficit.  Psychiatric:        Mood and Affect: Mood normal.        Behavior: Behavior normal.        Thought Content: Thought content normal.        Judgment: Judgment normal.   Assessment/Plan: 1. Type 2 diabetes mellitus with hyperglycemia, without long-term current use of insulin (HCC) - POCT HgB A1C 6.2 today. Continue metformin as prescribed. Continue to monitor blood sugars closely.   2. Essential hypertension Stable. Continue to take blood pressure medications as prescribed   3. Otalgia of left ear Small amount of wax in left ear canal. Recommend OTC debrox and warm water lavage. Use as directed.    General Counseling: Charlotta  verbalizes understanding of the findings of todays visit and agrees with plan of treatment. I have discussed any further diagnostic evaluation that may be needed or ordered today. We also reviewed her medications today. she has been encouraged to call the office with any questions or concerns that should arise related to todays visit.  Diabetes Counseling:  1. Addition of ACE inh/ ARB'S for nephroprotection. Microalbumin is updated  2. Diabetic foot care, prevention of complications. Podiatry consult 3. Exercise and lose weight.  4. Diabetic eye examination, Diabetic eye exam is updated  5. Monitor blood sugar closlely. nutrition counseling.  6. Sign and symptoms of hypoglycemia including shaking sweating,confusion and headaches.  This patient was seen by Leretha Pol FNP Collaboration with Dr Lavera Guise as a part of collaborative care agreement  Orders Placed This Encounter  Procedures  . POCT HgB A1C      Total time spent: 30 Minutes   Time spent includes review of chart, medications, test results, and follow up plan with the patient.      Dr Lavera Guise Internal medicine

## 2019-08-02 DIAGNOSIS — H9202 Otalgia, left ear: Secondary | ICD-10-CM | POA: Insufficient documentation

## 2019-08-06 ENCOUNTER — Other Ambulatory Visit: Payer: Self-pay

## 2019-08-06 DIAGNOSIS — Z794 Long term (current) use of insulin: Secondary | ICD-10-CM

## 2019-08-06 DIAGNOSIS — E119 Type 2 diabetes mellitus without complications: Secondary | ICD-10-CM

## 2019-08-06 MED ORDER — METFORMIN HCL ER 500 MG PO TB24: 500.0000 mg | ORAL_TABLET | Freq: Every day | ORAL | 5 refills | Status: DC

## 2019-08-18 ENCOUNTER — Other Ambulatory Visit: Payer: Self-pay

## 2019-08-18 MED ORDER — OMEPRAZOLE 20 MG PO CPDR: 20.0000 mg | DELAYED_RELEASE_CAPSULE | Freq: Every day | ORAL | 3 refills | Status: DC

## 2019-09-13 ENCOUNTER — Other Ambulatory Visit: Payer: Self-pay

## 2019-09-21 ENCOUNTER — Other Ambulatory Visit: Payer: Self-pay | Admitting: *Deleted

## 2019-09-21 ENCOUNTER — Other Ambulatory Visit: Payer: Self-pay | Admitting: Hematology and Oncology

## 2019-09-21 ENCOUNTER — Other Ambulatory Visit: Payer: Self-pay

## 2019-09-21 ENCOUNTER — Telehealth: Payer: Self-pay

## 2019-09-21 MED ORDER — LETROZOLE 2.5 MG PO TABS: 2.5000 mg | ORAL_TABLET | Freq: Every day | ORAL | 12 refills | Status: DC

## 2019-09-21 NOTE — Telephone Encounter (Signed)
Called phar and canceled letrozole pres pt gets that from her oncology and advised pt to fax to dr Patsy [place] and lmom to pt to call dr Patsy [place] office for that med

## 2019-10-19 ENCOUNTER — Ambulatory Visit: Payer: Medicare Other | Admitting: Nurse Practitioner

## 2019-11-15 ENCOUNTER — Other Ambulatory Visit: Payer: Self-pay

## 2019-11-15 MED ORDER — OMEPRAZOLE 20 MG PO CPDR: 20.0000 mg | DELAYED_RELEASE_CAPSULE | Freq: Every day | ORAL | 3 refills | Status: DC

## 2019-12-04 ENCOUNTER — Other Ambulatory Visit: Payer: Self-pay | Admitting: Internal Medicine

## 2020-01-31 ENCOUNTER — Other Ambulatory Visit: Payer: Self-pay

## 2020-01-31 DIAGNOSIS — E119 Type 2 diabetes mellitus without complications: Secondary | ICD-10-CM

## 2020-01-31 MED ORDER — METFORMIN HCL ER 500 MG PO TB24: 500.0000 mg | ORAL_TABLET | Freq: Every day | ORAL | 0 refills | Status: DC

## 2020-02-09 ENCOUNTER — Other Ambulatory Visit: Payer: Self-pay

## 2020-02-09 MED ORDER — OMEPRAZOLE 20 MG PO CPDR: 20.0000 mg | DELAYED_RELEASE_CAPSULE | Freq: Every day | ORAL | 3 refills | Status: DC

## 2020-02-16 DIAGNOSIS — Z23 Encounter for immunization: Secondary | ICD-10-CM | POA: Diagnosis not present

## 2020-02-21 ENCOUNTER — Other Ambulatory Visit: Payer: Self-pay

## 2020-02-21 MED ORDER — OMEPRAZOLE 20 MG PO CPDR: 20.0000 mg | DELAYED_RELEASE_CAPSULE | Freq: Every day | ORAL | 0 refills | Status: DC

## 2020-02-25 ENCOUNTER — Other Ambulatory Visit: Payer: Self-pay

## 2020-02-25 DIAGNOSIS — E119 Type 2 diabetes mellitus without complications: Secondary | ICD-10-CM

## 2020-02-25 DIAGNOSIS — Z794 Long term (current) use of insulin: Secondary | ICD-10-CM

## 2020-02-25 MED ORDER — METFORMIN HCL ER 500 MG PO TB24: 500.0000 mg | ORAL_TABLET | Freq: Every day | ORAL | 0 refills | Status: DC

## 2020-02-29 ENCOUNTER — Other Ambulatory Visit: Payer: Self-pay

## 2020-02-29 ENCOUNTER — Encounter: Payer: Self-pay | Admitting: Internal Medicine

## 2020-02-29 ENCOUNTER — Ambulatory Visit: Payer: Medicare Other | Admitting: Internal Medicine

## 2020-02-29 DIAGNOSIS — Z0001 Encounter for general adult medical examination with abnormal findings: Secondary | ICD-10-CM

## 2020-02-29 DIAGNOSIS — Z9011 Acquired absence of right breast and nipple: Secondary | ICD-10-CM | POA: Diagnosis not present

## 2020-02-29 DIAGNOSIS — E1165 Type 2 diabetes mellitus with hyperglycemia: Secondary | ICD-10-CM

## 2020-02-29 DIAGNOSIS — E782 Mixed hyperlipidemia: Secondary | ICD-10-CM | POA: Diagnosis not present

## 2020-02-29 DIAGNOSIS — C50911 Malignant neoplasm of unspecified site of right female breast: Secondary | ICD-10-CM

## 2020-02-29 DIAGNOSIS — E119 Type 2 diabetes mellitus without complications: Secondary | ICD-10-CM

## 2020-02-29 DIAGNOSIS — Z170 Estrogen receptor positive status [ER+]: Secondary | ICD-10-CM

## 2020-02-29 DIAGNOSIS — I10 Essential (primary) hypertension: Secondary | ICD-10-CM

## 2020-02-29 LAB — POCT GLYCOSYLATED HEMOGLOBIN (HGB A1C): Hemoglobin A1C: 6.2 % — AB (ref 4.0–5.6)

## 2020-02-29 MED ORDER — METFORMIN HCL ER 500 MG PO TB24: 500.0000 mg | ORAL_TABLET | Freq: Every day | ORAL | 1 refills | Status: DC

## 2020-02-29 MED ORDER — LOSARTAN POTASSIUM 50 MG PO TABS: ORAL_TABLET | ORAL | 1 refills | Status: DC

## 2020-02-29 NOTE — Progress Notes (Signed)
[facility] [place], [place] [address]  Internal MEDICINE  Office Visit Note  Patient Name: Abigail Wheeler  [id number]  [id number]  Date of Service: 03/03/2020  Chief Complaint  Patient presents with  . Annual wellness visit  . Hypertension  . policy update form    received  . Diabetes    HPI Pt is here for routine health maintenance examination. Overall she feels well, she needs refills on her medications. Diabetes is well controlled.BP is slightly elevated. She is not on a statin, no vascular study on file. Colonoscopy last year 2020, BMD in 2020 She history of mastectomy on the right side for invasive carcinoma by Dr. Jamal Collin 6 years ago.  She did receive adjuvant chemotherapy.  She is currently on Femara and Dr. Janese Banks is following her. she is doing very well.  Denies any breast masses.  No nipple discharge.  No fevers no chills no weight loss.  Mammogram personally reviewed showing no evidence of any concerning lesions. Refused covid -19 vaccine  Current Medication: Outpatient Encounter Medications as of 02/29/2020  Medication Sig  . acetaminophen (TYLENOL) 500 MG tablet Take 500 mg by mouth every 6 (six) hours as needed.  . calcium carbonate (OS-CAL) 600 MG TABS tablet Take 600 mg by mouth daily.  . Cholecalciferol (VITAMIN D-3) 1000 UNITS CAPS Take 1 capsule by mouth daily.  Marland Kitchen glucose blood test strip 1 each by Other route as needed for other. Use as instructed dx E11.65  . Lancets (FREESTYLE) lancets 1 each by Other route as needed for other. Use as instructed DX E11.65  . letrozole (FEMARA) 2.5 MG tablet Take 1 tablet (2.5 mg total) by mouth daily.  Marland Kitchen losartan (COZAAR) 50 MG tablet Take one tab a day for HTN  . metFORMIN (GLUCOPHAGE-XR) 500 MG 24 hr tablet Take 1 tablet (500 mg total) by mouth daily with breakfast.  . omeprazole (PRILOSEC) 20 MG capsule Take 1 capsule (20 mg total) by mouth daily.  Marland Kitchen pyridoxine (B-6) 100 MG tablet Vitamin B-6 100  mg tablet  Take 1 tablet twice a day by oral route.  . [DISCONTINUED] losartan (COZAAR) 25 MG tablet TAKE 1 TABLET BY MOUTH EVERY DAY  . [DISCONTINUED] metFORMIN (GLUCOPHAGE-XR) 500 MG 24 hr tablet Take 1 tablet (500 mg total) by mouth daily with breakfast.   No facility-administered encounter medications on file as of 02/29/2020.    Surgical History: Past Surgical History:  Procedure Laterality Date  . BREAST BIOPSY Right 04-05-13   positive  . BREAST SURGERY Right 05/20/13   mastectomy  . CESAREAN SECTION  1991  . COLONOSCOPY WITH PROPOFOL N/A 12/30/2018   Procedure: COLONOSCOPY WITH PROPOFOL;  Surgeon: Virgel Manifold, MD;  Location: [facility] ENDOSCOPY;  Service: Endoscopy;  Laterality: N/A;  . MASTECTOMY Right 2015   chemo and radiation    Medical History: Past Medical History:  Diagnosis Date  . Arthritis   . Breast cancer ([place]) 2015   chemo and radiation  . Cancer ([place]) 05-20-13   T1 N1 ER positive/PR negative HER2 negative. right breast  . Diabetes mellitus without complication ([place])   . GERD (gastroesophageal reflux disease)   . Hypertension   . PE (pulmonary embolism)   . Personal history of chemotherapy 2015   Right breast  . Personal history of radiation therapy 2015   Right breast  . Renal calculi     Family History: Family History  Problem Relation Age of Onset  . Lung cancer Father   .  Breast cancer Maternal Grandmother 68      Review of Systems  Constitutional: Negative for chills, diaphoresis and fatigue.  HENT: Negative for ear pain, postnasal drip and sinus pressure.   Eyes: Negative for photophobia, discharge, redness, itching and visual disturbance.  Respiratory: Negative for cough, shortness of breath and wheezing.   Cardiovascular: Negative for chest pain, palpitations and leg swelling.  Gastrointestinal: Negative for abdominal pain, constipation, diarrhea, nausea and vomiting.  Genitourinary: Negative for dysuria and flank pain.   Musculoskeletal: Negative for arthralgias, back pain, gait problem and neck pain.  Skin: Negative for color change.  Allergic/Immunologic: Negative for environmental allergies and food allergies.  Neurological: Negative for dizziness and headaches.  Hematological: Does not bruise/bleed easily.  Psychiatric/Behavioral: Negative for agitation, behavioral problems (depression) and hallucinations.     Vital Signs: BP (!) 146/92   Pulse 97   Temp 98.1 F (36.7 C)   Resp 16   Ht $R'5\' 1"'nt$  (1.549 m)   Wt 152 lb 6.4 oz (69.1 kg)   SpO2 98%   BMI 28.80 kg/m    Physical Exam Constitutional:      General: She is not in acute distress.    Appearance: She is well-developed. She is not diaphoretic.  HENT:     Head: Normocephalic and atraumatic.     Mouth/Throat:     Pharynx: No oropharyngeal exudate.  Eyes:     Pupils: Pupils are equal, round, and reactive to light.  Neck:     Thyroid: No thyromegaly.     Vascular: No JVD.     Trachea: No tracheal deviation.  Cardiovascular:     Rate and Rhythm: Normal rate and regular rhythm.     Pulses:          Dorsalis pedis pulses are 3+ on the right side and 3+ on the left side.       Posterior tibial pulses are 3+ on the right side and 3+ on the left side.     Heart sounds: Normal heart sounds. No murmur heard.  No friction rub. No gallop.   Pulmonary:     Effort: Pulmonary effort is normal. No respiratory distress.     Breath sounds: No wheezing or rales.  Chest:     Chest wall: No tenderness.     Breasts:        Left: No swelling or tenderness.     Comments: Right breast mastectomy //  Abdominal:     General: Bowel sounds are normal.     Palpations: Abdomen is soft.  Musculoskeletal:        General: Normal range of motion.     Cervical back: Normal range of motion and neck supple.     Right foot: Normal range of motion.     Left foot: Normal range of motion.  Feet:     Right foot:     Protective Sensation: 2 sites tested. 2 sites  sensed.     Skin integrity: Skin integrity normal.     Toenail Condition: Right toenails are normal.     Left foot:     Protective Sensation: 2 sites tested. 2 sites sensed.     Skin integrity: Skin integrity normal.     Toenail Condition: Left toenails are normal.  Lymphadenopathy:     Cervical: No cervical adenopathy.  Skin:    General: Skin is warm and dry.  Neurological:     Mental Status: She is alert and oriented to person, place, and time.  Cranial Nerves: No cranial nerve deficit.  Psychiatric:        Behavior: Behavior normal.        Thought Content: Thought content normal.        Judgment: Judgment normal.      LABS: Recent Results (from the past 2160 hour(s))  POCT HgB A1C     Status: Abnormal   Collection Time: 02/29/20  2:51 PM  Result Value Ref Range   Hemoglobin A1C 6.2 (A) 4.0 - 5.6 %   HbA1c POC (<> result, manual entry)     HbA1c, POC (prediabetic range)     HbA1c, POC (controlled diabetic range)       Assessment/Plan: 1. Encounter for general adult medical examination with abnormal findings All PHM is updated, age appropriate diagnostics ordered   2. Type 2 diabetes mellitus with hyperglycemia, without long-term current use of insulin (HCC) Normal diabetic foot exam, schedule for eye exam, continue diabetic care as before  - POCT HgB A1C - metFORMIN (GLUCOPHAGE-XR) 500 MG 24 hr tablet; Take 1 tablet (500 mg total) by mouth daily with breakfast.  Dispense: 90 tablet; Refill: 1 - Microalbumin, urine  3. Mixed hyperlipidemia Check fasting lipid profile, will need one vascular study to assess atherosclerosis  - Lipid Panel With LDL/HDL Ratio - TSH + free T4  4. Benign hypertension BP is elevated but repeat is 135/82, she will monitor at home  - losartan (COZAAR) 50 MG tablet; Take one tab a day for HTN  Dispense: 90 tablet; Refill: 1 - Microalbumin, urine  5. Malignant neoplasm of right breast in female, estrogen receptor positive, unspecified  site of breast ([place]) Followed by oncology.   6. History of right mastectomy Followed by Oncology    General Counseling: Abigail Wheeler verbalizes understanding of the findings of todays visit and agrees with plan of treatment. I have discussed any further diagnostic evaluation that may be needed or ordered today. We also reviewed her medications today. she has been encouraged to call the office with any questions or concerns that should arise related to todays visit.  Counseling: Diabetes Counseling:  1. Addition of ACE inh/ ARB'S for nephroprotection. Microalbumin is updated  2. Diabetic foot care, prevention of complications. Podiatry consult 3. Exercise and lose weight.  4. Diabetic eye examination, Diabetic eye exam is updated  5. Monitor blood sugar closlely. nutrition counseling.  6. Sign and symptoms of hypoglycemia including shaking sweating,confusion and headaches.   Orders Placed This Encounter  Procedures  . Lipid Panel With LDL/HDL Ratio  . TSH + free T4  . POCT HgB A1C    Meds ordered this encounter  Medications  . metFORMIN (GLUCOPHAGE-XR) 500 MG 24 hr tablet    Sig: Take 1 tablet (500 mg total) by mouth daily with breakfast.    Dispense:  90 tablet    Refill:  1    Pt need appt  For med  . losartan (COZAAR) 50 MG tablet    Sig: Take one tab a day for HTN    Dispense:  90 tablet    Refill:  1    Total time spent:35 Minutes  Time spent includes review of chart, medications, test results, and follow up plan with the patient.     Lavera Guise, MD  Internal Medicine

## 2020-03-22 ENCOUNTER — Other Ambulatory Visit: Payer: Self-pay

## 2020-03-22 DIAGNOSIS — E1165 Type 2 diabetes mellitus with hyperglycemia: Secondary | ICD-10-CM

## 2020-03-22 MED ORDER — METFORMIN HCL ER 500 MG PO TB24: 500.0000 mg | ORAL_TABLET | Freq: Every day | ORAL | 1 refills | Status: DC

## 2020-03-24 ENCOUNTER — Other Ambulatory Visit: Payer: Self-pay | Admitting: Internal Medicine

## 2020-03-24 DIAGNOSIS — H40003 Preglaucoma, unspecified, bilateral: Secondary | ICD-10-CM | POA: Diagnosis not present

## 2020-03-24 DIAGNOSIS — Z1231 Encounter for screening mammogram for malignant neoplasm of breast: Secondary | ICD-10-CM

## 2020-04-12 ENCOUNTER — Other Ambulatory Visit: Payer: Self-pay | Admitting: Internal Medicine

## 2020-04-12 ENCOUNTER — Other Ambulatory Visit: Payer: Self-pay

## 2020-04-12 MED ORDER — ACCU-CHEK GUIDE VI STRP
1.0000 | ORAL_STRIP | Freq: Every day | 1 refills | Status: DC
Start: 2020-04-12 — End: 2020-04-12

## 2020-04-12 NOTE — Telephone Encounter (Signed)
DX E11.65

## 2020-04-17 ENCOUNTER — Other Ambulatory Visit: Payer: Self-pay | Admitting: Internal Medicine

## 2020-05-01 ENCOUNTER — Ambulatory Visit
Admission: RE | Admit: 2020-05-01 | Discharge: 2020-05-01 | Disposition: A | Discharge status: Home / Self Care | Payer: Medicare Other | Source: Ambulatory Visit | Attending: Internal Medicine | Admitting: Internal Medicine

## 2020-05-01 ENCOUNTER — Other Ambulatory Visit: Payer: Self-pay

## 2020-05-01 DIAGNOSIS — Z1231 Encounter for screening mammogram for malignant neoplasm of breast: Secondary | ICD-10-CM | POA: Diagnosis not present

## 2020-05-02 ENCOUNTER — Other Ambulatory Visit: Payer: Self-pay

## 2020-05-02 MED ORDER — FREESTYLE LANCETS MISC: 1.0000 | 5 refills | Status: DC | PRN

## 2020-05-08 ENCOUNTER — Ambulatory Visit: Payer: Medicare Other | Admitting: Surgery

## 2020-05-12 ENCOUNTER — Encounter: Payer: Self-pay | Admitting: Oncology

## 2020-05-15 ENCOUNTER — Inpatient Hospital Stay: Payer: Medicare Other | Attending: Oncology | Admitting: Oncology

## 2020-05-15 VITALS — BP 152/81 | HR 101 | Temp 98.9°F | Resp 18 | Wt 152.0 lb

## 2020-05-15 DIAGNOSIS — Z86718 Personal history of other venous thrombosis and embolism: Secondary | ICD-10-CM | POA: Diagnosis not present

## 2020-05-15 DIAGNOSIS — Z853 Personal history of malignant neoplasm of breast: Secondary | ICD-10-CM | POA: Diagnosis not present

## 2020-05-15 DIAGNOSIS — Z7901 Long term (current) use of anticoagulants: Secondary | ICD-10-CM | POA: Diagnosis not present

## 2020-05-15 DIAGNOSIS — C50911 Malignant neoplasm of unspecified site of right female breast: Secondary | ICD-10-CM | POA: Insufficient documentation

## 2020-05-15 DIAGNOSIS — E119 Type 2 diabetes mellitus without complications: Secondary | ICD-10-CM | POA: Diagnosis not present

## 2020-05-15 DIAGNOSIS — Z08 Encounter for follow-up examination after completed treatment for malignant neoplasm: Secondary | ICD-10-CM

## 2020-05-15 DIAGNOSIS — K219 Gastro-esophageal reflux disease without esophagitis: Secondary | ICD-10-CM | POA: Insufficient documentation

## 2020-05-15 DIAGNOSIS — Z923 Personal history of irradiation: Secondary | ICD-10-CM | POA: Insufficient documentation

## 2020-05-15 DIAGNOSIS — Z86711 Personal history of pulmonary embolism: Secondary | ICD-10-CM | POA: Insufficient documentation

## 2020-05-15 DIAGNOSIS — Z9221 Personal history of antineoplastic chemotherapy: Secondary | ICD-10-CM | POA: Diagnosis not present

## 2020-05-15 DIAGNOSIS — M8580 Other specified disorders of bone density and structure, unspecified site: Secondary | ICD-10-CM | POA: Insufficient documentation

## 2020-05-15 DIAGNOSIS — Z170 Estrogen receptor positive status [ER+]: Secondary | ICD-10-CM | POA: Insufficient documentation

## 2020-05-15 DIAGNOSIS — Z79811 Long term (current) use of aromatase inhibitors: Secondary | ICD-10-CM

## 2020-05-15 DIAGNOSIS — Z7984 Long term (current) use of oral hypoglycemic drugs: Secondary | ICD-10-CM | POA: Diagnosis not present

## 2020-05-15 DIAGNOSIS — Z9011 Acquired absence of right breast and nipple: Secondary | ICD-10-CM | POA: Insufficient documentation

## 2020-05-17 ENCOUNTER — Ambulatory Visit: Payer: Medicare Other | Admitting: Surgery

## 2020-05-18 NOTE — Progress Notes (Signed)
Hematology/Oncology Consult note [facility]  Telephone:([phone] [phone]  Patient Care Team: Lavera Guise, MD as PCP - General (Internal Medicine) Christene Lye, MD (General Surgery) Lavera Guise, MD (Internal Medicine) Lequita Asal, MD as Referring Physician (Hematology and Oncology)   Name of the patient: Abigail Wheeler  [id number]  [birth year]   Date of visit: 05/18/20  Diagnosis- history of stage IIIb right breast cancer currently on Femara  Chief complaint/ Reason for visit-routine follow-up of breast cancer  Heme/Onc history: Patient is a 70 year old female who sees Dr. Mike Gip for her history of stage IIIb right breast cancer which was diagnosed in January 2015. She is status post right mastectomy and sentinel lymph node biopsy. Pathology showed grade 1 lobular carcinoma multifocal, largest focus was 1.7 cm invading the pectoralis muscle additional foci were 1.4 cm and 1 mm respectively. Sentinel lymph node was also positive with extracapsular extension. Tumor was ER PR positive and HER-2/neu negative. Patient received dose dense AC followed by 12 weeks of Taxol. She also received adjuvant radiation to her chest and axilla. She then started taking Femara. Patient also diagnosed with multiple left lower lobe pulmonary emboli in April 2015. Hypercoagulable work-up was negative and patient is on Xarelto. Bone density scan in May 2018 showed osteopenia with a score of -1.9. Patient was not a candidate for FRAX score due to Femara.  Interval history-patient reports doing well and denies any breast concerns today.  She is currently on Femara which she is tolerating well without any significant side effects.  ECOG PS- 1Pain scale- 0 Opioid associated constipation- no  Review of systems- Review of Systems  Constitutional: Positive for malaise/fatigue. Negative for chills, fever and weight loss.  HENT: Negative for  congestion, ear discharge and nosebleeds.   Eyes: Negative for blurred vision.  Respiratory: Negative for cough, hemoptysis, sputum production, shortness of breath and wheezing.   Cardiovascular: Negative for chest pain, palpitations, orthopnea and claudication.  Gastrointestinal: Negative for abdominal pain, blood in stool, constipation, diarrhea, heartburn, melena, nausea and vomiting.  Genitourinary: Negative for dysuria, flank pain, frequency, hematuria and urgency.  Musculoskeletal: Negative for back pain, joint pain and myalgias.  Skin: Negative for rash.  Neurological: Negative for dizziness, tingling, focal weakness, seizures, weakness and headaches.  Endo/Heme/Allergies: Does not bruise/bleed easily.  Psychiatric/Behavioral: Negative for depression and suicidal ideas. The patient does not have insomnia.        No Known Allergies   Past Medical History:  Diagnosis Date  . Arthritis   . Breast cancer ([place]) 2015   chemo and radiation  . Cancer ([place]) 05-20-13   T1 N1 ER positive/PR negative HER2 negative. right breast  . Diabetes mellitus without complication ([place])   . GERD (gastroesophageal reflux disease)   . Hypertension   . PE (pulmonary embolism)   . Personal history of chemotherapy 2015   Right breast  . Personal history of radiation therapy 2015   Right breast  . Renal calculi      Past Surgical History:  Procedure Laterality Date  . BREAST BIOPSY Right 04-05-13   positive  . BREAST SURGERY Right 05/20/13   mastectomy  . CESAREAN SECTION  1991  . COLONOSCOPY WITH PROPOFOL N/A 12/30/2018   Procedure: COLONOSCOPY WITH PROPOFOL;  Surgeon: Virgel Manifold, MD;  Location: [facility] ENDOSCOPY;  Service: Endoscopy;  Laterality: N/A;  . MASTECTOMY Right 2015   chemo and radiation    Social History   Socioeconomic History  .  Marital status: Married    Spouse name: Not on file  . Number of children: Not on file  . Years of education: Not on file  . Highest  education level: Not on file  Occupational History  . Not on file  Tobacco Use  . Smoking status: Never Smoker  . Smokeless tobacco: Never Used  Vaping Use  . Vaping Use: Never used  Substance and Sexual Activity  . Alcohol use: No  . Drug use: No  . Sexual activity: Yes  Other Topics Concern  . Not on file  Social History Narrative  . Not on file   Social Determinants of Health   Financial Resource Strain: Not on file  Food Insecurity: Not on file  Transportation Needs: Not on file  Physical Activity: Not on file  Stress: Not on file  Social Connections: Not on file  Intimate Partner Violence: Not on file    Family History  Problem Relation Age of Onset  . Lung cancer Father   . Breast cancer Maternal Grandmother 68     Current Outpatient Medications:  .  ACCU-CHEK GUIDE test strip, 1 EACH BY OTHER ROUTE DAILY. USE AS INSTRUCTED, Disp: 100 strip, Rfl: 1 .  acetaminophen (TYLENOL) 500 MG tablet, Take 500 mg by mouth every 6 (six) hours as needed., Disp: , Rfl:  .  calcium carbonate (OS-CAL) 600 MG TABS tablet, Take 600 mg by mouth daily., Disp: , Rfl:  .  Cholecalciferol (VITAMIN D-3) 1000 UNITS CAPS, Take 1 capsule by mouth daily., Disp: , Rfl:  .  Lancets (FREESTYLE) lancets, 1 each by Other route as needed for other. Use as instructed DX E11.65, Disp: 100 each, Rfl: 5 .  letrozole (FEMARA) 2.5 MG tablet, Take 1 tablet (2.5 mg total) by mouth daily., Disp: 30 tablet, Rfl: 12 .  losartan (COZAAR) 25 MG tablet, , Disp: , Rfl:  .  losartan (COZAAR) 50 MG tablet, Take one tab a day for HTN, Disp: 90 tablet, Rfl: 1 .  metFORMIN (GLUCOPHAGE-XR) 500 MG 24 hr tablet, Take 1 tablet (500 mg total) by mouth daily with breakfast., Disp: 90 tablet, Rfl: 1 .  omeprazole (PRILOSEC) 20 MG capsule, Take 1 capsule (20 mg total) by mouth daily., Disp: 30 capsule, Rfl: 0 .  pyridoxine (B-6) 100 MG tablet, Vitamin B-6 100 mg tablet  Take 1 tablet twice a day by oral route., Disp: , Rfl:    Physical exam:  Vitals:   05/15/20 1100  BP: (!) 152/81  Pulse: (!) 101  Resp: 18  Temp: 98.9 F (37.2 C)  TempSrc: Tympanic  SpO2: 100%  Weight: 152 lb (68.9 kg)   Physical Exam Constitutional:      General: She is not in acute distress. Eyes:     Extraocular Movements: EOM normal.  Cardiovascular:     Rate and Rhythm: Normal rate and regular rhythm.     Heart sounds: Normal heart sounds.  Pulmonary:     Effort: Pulmonary effort is normal.     Breath sounds: Normal breath sounds.  Abdominal:     General: Bowel sounds are normal.     Palpations: Abdomen is soft.  Skin:    General: Skin is warm and dry.  Neurological:     Mental Status: She is alert and oriented to person, place, and time.   Breast exam: Patient is s/p right mastectomy without reconstruction.  No evidence of chest wall recurrence.  No palpable masses in the left breast.  No palpable  bilateral axillary adenopathy.  CMP Latest Ref Rng & Units 11/03/2018  Glucose 70 - 99 mg/dL 141(H)  BUN 8 - 23 mg/dL 14  Creatinine 0.44 - 1.00 mg/dL 0.99  Sodium 135 - 145 mmol/L 143  Potassium 3.5 - 5.1 mmol/L 3.5  Chloride 98 - 111 mmol/L 107  CO2 22 - 32 mmol/L 27  Calcium 8.9 - 10.3 mg/dL 10.0  Total Protein 6.5 - 8.1 g/dL 7.9  Total Bilirubin 0.3 - 1.2 mg/dL 0.6  Alkaline Phos 38 - 126 U/L 54  AST 15 - 41 U/L 26  ALT 0 - 44 U/L 32   CBC Latest Ref Rng & Units 11/03/2018  WBC 4.0 - 10.5 K/uL 4.4  Hemoglobin 12.0 - 15.0 g/dL 13.2  Hematocrit 36.0 - 46.0 % 38.8  Platelets 150 - 400 K/uL 216    No images are attached to the encounter.  MM 3D SCREEN BREAST UNI LEFT  Result Date: 05/01/2020 CLINICAL DATA:  Screening. EXAM: DIGITAL SCREENING UNILATERAL LEFT MAMMOGRAM WITH CAD AND TOMO COMPARISON:  Previous exam(s). ACR Breast Density Category b: There are scattered areas of fibroglandular density. FINDINGS: There are no findings suspicious for malignancy. Images were processed with CAD. IMPRESSION: No  mammographic evidence of malignancy. A result letter of this screening mammogram will be mailed directly to the patient. RECOMMENDATION: Screening mammogram in one year. (Code:SM-B-01Y) BI-RADS CATEGORY  1: Negative. Electronically Signed   By: Lajean Manes M.D.   On: 05/01/2020 15:32     Assessment and plan- Patient is a 70 y.o. female with history of stage IIIb right breast cancer s/p adjuvant chemotherapy and radiation treatment.  She is currently on Femara and this is a routine follow-up visit  Clinically patient is doing well with no concerning signs and symptoms of recurrence based on today's exam.  She did have a mammogram in January of this month which did not show any evidence of malignancy in the left breast.  Her last bone density scan was in July 2020 and we will plan to repeat one in July of this year.  Prior bone density scan did show osteopenia for which she is on calcium and vitamin D.    Visit Diagnosis 1. Encounter for follow-up surveillance of breast cancer   2. Use of letrozole (Femara)      Dr. Randa Evens, MD, MPH [facility] [phone] 05/18/2020 8:52 AM

## 2020-05-24 ENCOUNTER — Ambulatory Visit (INDEPENDENT_AMBULATORY_CARE_PROVIDER_SITE_OTHER): Payer: Medicare Other | Admitting: Surgery

## 2020-05-24 ENCOUNTER — Encounter: Payer: Self-pay | Admitting: Surgery

## 2020-05-24 ENCOUNTER — Other Ambulatory Visit: Payer: Self-pay

## 2020-05-24 VITALS — BP 175/89 | HR 97 | Temp 98.6°F | Ht 61.0 in | Wt 153.2 lb

## 2020-05-24 DIAGNOSIS — Z853 Personal history of malignant neoplasm of breast: Secondary | ICD-10-CM

## 2020-05-24 DIAGNOSIS — Z9011 Acquired absence of right breast and nipple: Secondary | ICD-10-CM

## 2020-05-24 DIAGNOSIS — Z08 Encounter for follow-up examination after completed treatment for malignant neoplasm: Secondary | ICD-10-CM

## 2020-05-24 DIAGNOSIS — Z170 Estrogen receptor positive status [ER+]: Secondary | ICD-10-CM

## 2020-05-24 DIAGNOSIS — Z9221 Personal history of antineoplastic chemotherapy: Secondary | ICD-10-CM

## 2020-05-24 NOTE — Patient Instructions (Signed)
We will contact you in December 2022 to schedule your left breast mammogram for January 2023. Please call if you have any issues or questions.

## 2020-05-26 NOTE — Progress Notes (Signed)
Outpatient Surgical Follow Up  05/26/2020  Abigail Wheeler is an 70 y.o. female.   Chief Complaint  Patient presents with  . Follow-up    1 year mammogram    HPI: Ms. Pouncey is a 70 year old female well-known to our practice with a history of mastectomy on the right side for invasive carcinoma by Dr. Jamal Collin 7 years ago.  She did receive adjuvant chemotherapy.  She is currently on Femara and Dr. Janese Banks is following her. she is doing very well.  Denies any breast masses.  No nipple discharge.  No fevers no chills no weight loss.  Mammogram personally reviewed showing no evidence of any concerning lesions. She was concerned about some excess tissue along the right axilla.  Past Medical History:  Diagnosis Date  . Arthritis   . Breast cancer ([place]) 2015   chemo and radiation  . Cancer ([place]) 05-20-13   T1 N1 ER positive/PR negative HER2 negative. right breast  . Diabetes mellitus without complication ([place])   . GERD (gastroesophageal reflux disease)   . Hypertension   . PE (pulmonary embolism)   . Personal history of chemotherapy 2015   Right breast  . Personal history of radiation therapy 2015   Right breast  . Renal calculi     Past Surgical History:  Procedure Laterality Date  . BREAST BIOPSY Right 04-05-13   positive  . BREAST SURGERY Right 05/20/13   mastectomy  . CESAREAN SECTION  1991  . COLONOSCOPY WITH PROPOFOL N/A 12/30/2018   Procedure: COLONOSCOPY WITH PROPOFOL;  Surgeon: Virgel Manifold, MD;  Location: [facility] ENDOSCOPY;  Service: Endoscopy;  Laterality: N/A;  . MASTECTOMY Right 2015   chemo and radiation    Family History  Problem Relation Age of Onset  . Lung cancer Father   . Breast cancer Maternal Grandmother 68    Social History:  reports that she has never smoked. She has never used smokeless tobacco. She reports that she does not drink alcohol and does not use drugs.  Allergies: No Known Allergies  Medications reviewed.    ROS Full ROS performed and  is otherwise negative other than what is stated in HPI   BP (!) 175/89   Pulse 97   Temp 98.6 F (37 C) (Oral)   Ht _0  (1.549 m)   Wt 153 lb 3.2 oz (69.5 kg)   SpO2 98%   BMI 28.95 kg/m   Physical Exam   Physical Exam Vitals and nursing note reviewed. Exam conducted with a chaperone present.  Constitutional:      General: She is not in acute distress.    Appearance: Normal appearance. She is normal weight.  Eyes:     General: No scleral icterus.       Right eye: No discharge.        Left eye: No discharge.  Cardiovascular:     Rate and Rhythm: Normal rate and regular rhythm.     Heart sounds: No murmur.  Pulmonary:     Effort: Pulmonary effort is normal. No respiratory distress.     Breath sounds: Normal breath sounds. No stridor.     Comments: BREAST: Right mastectomy scar. No masses No LAD. Excess tissue on right axilla from mastectomy scar. No masses and no infection. Left breast w/o concerning mass. Abdominal:     General: Abdomen is flat. There is no distension.     Palpations: Abdomen is soft. There is no mass.     Tenderness: There is no abdominal  tenderness.     Hernia: No hernia is present.  Musculoskeletal:        General: No swelling or tenderness. Normal range of motion.  Skin:    General: Skin is warm and dry.     Capillary Refill: Capillary refill takes less than 2 seconds.  Neurological:     General: No focal deficit present.     Mental Status: She is alert and oriented to person, place, and time.  Psychiatric:        Mood and Affect: Mood normal.        Behavior: Behavior normal.        Thought Content: Thought content normal.        Judgment: Judgment normal.       Assessment/Plan:  70 year old female with a history of breast cancer no evidence of local recurrence no evidence of new concerning breast pathology.  Excess tissue in the right axilla related to mastectomy scar and incision, no evidence of any issues. We will see her back in 1  year with physical examination mammogram. There are no diagnoses linked to this encounter.   Greater than 50% of the 25 minutes  visit was spent in counseling/coordination of care   Caroleen Hamman, MD [place] Surgeon

## 2020-06-07 ENCOUNTER — Other Ambulatory Visit: Payer: Self-pay

## 2020-06-07 MED ORDER — OMEPRAZOLE 20 MG PO CPDR: 20.0000 mg | DELAYED_RELEASE_CAPSULE | Freq: Every day | ORAL | 0 refills | Status: DC

## 2020-06-09 ENCOUNTER — Other Ambulatory Visit: Payer: Self-pay | Admitting: Oncology

## 2020-06-29 ENCOUNTER — Ambulatory Visit: Payer: Medicare Other | Admitting: Physician Assistant

## 2020-07-17 ENCOUNTER — Encounter: Payer: Self-pay | Admitting: Physician Assistant

## 2020-07-17 ENCOUNTER — Other Ambulatory Visit: Payer: Self-pay

## 2020-07-17 ENCOUNTER — Ambulatory Visit: Payer: Medicare Other | Admitting: Physician Assistant

## 2020-07-17 DIAGNOSIS — E1165 Type 2 diabetes mellitus with hyperglycemia: Secondary | ICD-10-CM

## 2020-07-17 DIAGNOSIS — C50911 Malignant neoplasm of unspecified site of right female breast: Secondary | ICD-10-CM

## 2020-07-17 DIAGNOSIS — E782 Mixed hyperlipidemia: Secondary | ICD-10-CM | POA: Diagnosis not present

## 2020-07-17 DIAGNOSIS — I10 Essential (primary) hypertension: Secondary | ICD-10-CM | POA: Diagnosis not present

## 2020-07-17 DIAGNOSIS — Z9011 Acquired absence of right breast and nipple: Secondary | ICD-10-CM

## 2020-07-17 DIAGNOSIS — Z170 Estrogen receptor positive status [ER+]: Secondary | ICD-10-CM

## 2020-07-17 LAB — POCT GLYCOSYLATED HEMOGLOBIN (HGB A1C): Hemoglobin A1C: 6 % — AB (ref 4.0–5.6)

## 2020-07-17 NOTE — Progress Notes (Signed)
[facility] [place], [place] [address]  Internal MEDICINE  Office Visit Note  Patient Name: Abigail Wheeler  [id number]  [id number]  Date of Service: 07/19/2020  Chief Complaint  Patient presents with  . Diabetes    4 month fup  . Quality Metric Gaps    Hep C screen, Covid vacc pt has had all 3, Tdap, PNA vacc    HPI Pt is here for routine follow up. She has no complaints today. -She has a hx of right mastectomy for invasive carcinoma and received adjuvant chemo. She is on femara and followed by Dr. Janese Banks. -did not have labs done--need to check lipids since not on any statin -BP at home 127/75; recheck in office 142/86. Pulse initially elevated, but improved on exam -BG 125 this AM, ranges 98-130.  Current Medication: Outpatient Encounter Medications as of 07/17/2020  Medication Sig  . ACCU-CHEK GUIDE test strip 1 EACH BY OTHER ROUTE DAILY. USE AS INSTRUCTED  . acetaminophen (TYLENOL) 500 MG tablet Take 500 mg by mouth every 6 (six) hours as needed.  . calcium carbonate (OS-CAL) 600 MG TABS tablet Take 600 mg by mouth daily.  . Cholecalciferol (VITAMIN D-3) 1000 UNITS CAPS Take 1 capsule by mouth daily.  . Lancets (FREESTYLE) lancets 1 each by Other route as needed for other. Use as instructed DX E11.65  . letrozole (FEMARA) 2.5 MG tablet TAKE 1 TABLET BY MOUTH EVERY DAY  . losartan (COZAAR) 50 MG tablet Take one tab a day for HTN  . metFORMIN (GLUCOPHAGE-XR) 500 MG 24 hr tablet Take 1 tablet (500 mg total) by mouth daily with breakfast.  . omeprazole (PRILOSEC) 20 MG capsule Take 1 capsule (20 mg total) by mouth daily.  Marland Kitchen pyridoxine (B-6) 100 MG tablet Vitamin B-6 100 mg tablet  Take 1 tablet twice a day by oral route.   No facility-administered encounter medications on file as of 07/17/2020.    Surgical History: Past Surgical History:  Procedure Laterality Date  . BREAST BIOPSY Right 04-05-13   positive  . BREAST SURGERY Right 05/20/13    mastectomy  . CESAREAN SECTION  1991  . COLONOSCOPY WITH PROPOFOL N/A 12/30/2018   Procedure: COLONOSCOPY WITH PROPOFOL;  Surgeon: Virgel Manifold, MD;  Location: [facility] ENDOSCOPY;  Service: Endoscopy;  Laterality: N/A;  . MASTECTOMY Right 2015   chemo and radiation    Medical History: Past Medical History:  Diagnosis Date  . Arthritis   . Breast cancer ([place]) 2015   chemo and radiation  . Cancer ([place]) 05-20-13   T1 N1 ER positive/PR negative HER2 negative. right breast  . Diabetes mellitus without complication ([place])   . GERD (gastroesophageal reflux disease)   . Hypertension   . PE (pulmonary embolism)   . Personal history of chemotherapy 2015   Right breast  . Personal history of radiation therapy 2015   Right breast  . Renal calculi     Family History: Family History  Problem Relation Age of Onset  . Lung cancer Father   . Breast cancer Maternal Grandmother 68    Social History   Socioeconomic History  . Marital status: Married    Spouse name: Not on file  . Number of children: Not on file  . Years of education: Not on file  . Highest education level: Not on file  Occupational History  . Not on file  Tobacco Use  . Smoking status: Never Smoker  . Smokeless tobacco: Never Used  Vaping  Use  . Vaping Use: Never used  Substance and Sexual Activity  . Alcohol use: No  . Drug use: No  . Sexual activity: Yes  Other Topics Concern  . Not on file  Social History Narrative  . Not on file   Social Determinants of Health   Financial Resource Strain: Not on file  Food Insecurity: Not on file  Transportation Needs: Not on file  Physical Activity: Not on file  Stress: Not on file  Social Connections: Not on file  Intimate Partner Violence: Not on file      Review of Systems  Constitutional: Negative for chills, fatigue and unexpected weight change.  HENT: Negative for congestion, postnasal drip, rhinorrhea, sneezing and sore throat.   Eyes: Negative for  redness.  Respiratory: Negative for cough, chest tightness and shortness of breath.   Cardiovascular: Negative for chest pain and palpitations.  Gastrointestinal: Negative for abdominal pain, constipation, diarrhea, nausea and vomiting.  Genitourinary: Negative for dysuria and frequency.  Musculoskeletal: Positive for arthralgias. Negative for back pain, joint swelling and neck pain.  Skin: Negative for rash.  Neurological: Negative.  Negative for tremors and numbness.  Hematological: Negative for adenopathy. Does not bruise/bleed easily.  Psychiatric/Behavioral: Negative for behavioral problems (Depression), sleep disturbance and suicidal ideas. The patient is not nervous/anxious.     Vital Signs: BP (!) 152/82   Pulse (!) 111   Temp 97.9 F (36.6 C)   Resp 16   Ht $R'5\' 1"'oM$  (1.549 m)   Wt 155 lb 3.2 oz (70.4 kg)   SpO2 97%   BMI 29.32 kg/m    Physical Exam Vitals and nursing note reviewed.  Constitutional:      General: She is not in acute distress.    Appearance: She is well-developed. She is not diaphoretic.  HENT:     Head: Normocephalic and atraumatic.     Mouth/Throat:     Pharynx: No oropharyngeal exudate.  Eyes:     Pupils: Pupils are equal, round, and reactive to light.  Neck:     Thyroid: No thyromegaly.     Vascular: No JVD.     Trachea: No tracheal deviation.  Cardiovascular:     Rate and Rhythm: Normal rate and regular rhythm.     Heart sounds: Normal heart sounds. No murmur heard. No friction rub. No gallop.   Pulmonary:     Effort: Pulmonary effort is normal. No respiratory distress.     Breath sounds: No wheezing or rales.  Chest:     Chest wall: No tenderness.  Abdominal:     General: Bowel sounds are normal.     Palpations: Abdomen is soft.  Musculoskeletal:        General: Normal range of motion.     Cervical back: Normal range of motion and neck supple.  Lymphadenopathy:     Cervical: No cervical adenopathy.  Skin:    General: Skin is warm  and dry.  Neurological:     Mental Status: She is alert and oriented to person, place, and time.     Cranial Nerves: No cranial nerve deficit.  Psychiatric:        Behavior: Behavior normal.        Thought Content: Thought content normal.        Judgment: Judgment normal.        Assessment/Plan: 1. Type 2 diabetes mellitus with hyperglycemia, without long-term current use of insulin (HCC) - POCT glycosylated hemoglobin (Hb A1C)  Is 6.0 today. Continue  metformin and monitor BG at home.  2. Essential hypertension Initially elevated, but is well controlled at home and improved on recheck in office. Continue Losartan and home monitoring.  3. Mixed hyperlipidemia Pt did not have labs done after last visit, she will have these done now. She is not on any statin and may need vascular study in future. Will await labs.  4. Malignant neoplasm of right breast in female, estrogen receptor positive, unspecified site of breast ([place]) Followed by oncology, on Femara  5. History of right mastectomy Followed by oncology  General Counseling: Abigail Wheeler verbalizes understanding of the findings of todays visit and agrees with plan of treatment. I have discussed any further diagnostic evaluation that may be needed or ordered today. We also reviewed her medications today. she has been encouraged to call the office with any questions or concerns that should arise related to todays visit.    Orders Placed This Encounter  Procedures  . POCT glycosylated hemoglobin (Hb A1C)    No orders of the defined types were placed in this encounter.   This patient was seen by Drema Dallas, PA-C in collaboration with Dr. Clayborn Bigness as a part of collaborative care agreement.   Total time spent:30 Minutes Time spent includes review of chart, medications, test results, and follow up plan with the patient.      Dr Lavera Guise Internal medicine

## 2020-07-29 ENCOUNTER — Other Ambulatory Visit: Payer: Self-pay | Admitting: Internal Medicine

## 2020-07-31 ENCOUNTER — Other Ambulatory Visit: Payer: Self-pay | Admitting: Internal Medicine

## 2020-07-31 DIAGNOSIS — I10 Essential (primary) hypertension: Secondary | ICD-10-CM

## 2020-08-02 ENCOUNTER — Emergency Department: Payer: No Typology Code available for payment source

## 2020-08-02 ENCOUNTER — Other Ambulatory Visit: Payer: Self-pay

## 2020-08-02 ENCOUNTER — Emergency Department
Admission: EM | Admit: 2020-08-02 | Discharge: 2020-08-02 | Disposition: A | Discharge status: Home / Self Care | Payer: No Typology Code available for payment source | Attending: Emergency Medicine | Admitting: Emergency Medicine

## 2020-08-02 DIAGNOSIS — R000 Tachycardia, unspecified: Secondary | ICD-10-CM | POA: Diagnosis not present

## 2020-08-02 DIAGNOSIS — E119 Type 2 diabetes mellitus without complications: Secondary | ICD-10-CM | POA: Insufficient documentation

## 2020-08-02 DIAGNOSIS — Y92410 Unspecified street and highway as the place of occurrence of the external cause: Secondary | ICD-10-CM | POA: Diagnosis not present

## 2020-08-02 DIAGNOSIS — Z7984 Long term (current) use of oral hypoglycemic drugs: Secondary | ICD-10-CM | POA: Insufficient documentation

## 2020-08-02 DIAGNOSIS — R42 Dizziness and giddiness: Secondary | ICD-10-CM | POA: Diagnosis not present

## 2020-08-02 DIAGNOSIS — Z79899 Other long term (current) drug therapy: Secondary | ICD-10-CM | POA: Insufficient documentation

## 2020-08-02 DIAGNOSIS — I10 Essential (primary) hypertension: Secondary | ICD-10-CM | POA: Insufficient documentation

## 2020-08-02 DIAGNOSIS — S0990XA Unspecified injury of head, initial encounter: Secondary | ICD-10-CM | POA: Diagnosis not present

## 2020-08-02 DIAGNOSIS — Z041 Encounter for examination and observation following transport accident: Secondary | ICD-10-CM | POA: Diagnosis not present

## 2020-08-02 DIAGNOSIS — Z853 Personal history of malignant neoplasm of breast: Secondary | ICD-10-CM | POA: Diagnosis not present
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
## 2020-08-02 NOTE — ED Notes (Signed)
Patient transported to CT 

## 2020-08-05 ENCOUNTER — Other Ambulatory Visit: Payer: Self-pay | Admitting: Internal Medicine

## 2020-09-26 ENCOUNTER — Other Ambulatory Visit: Payer: Self-pay | Admitting: Internal Medicine

## 2020-10-04 DIAGNOSIS — C50911 Malignant neoplasm of unspecified site of right female breast: Secondary | ICD-10-CM | POA: Diagnosis not present

## 2020-10-04 DIAGNOSIS — I10 Essential (primary) hypertension: Secondary | ICD-10-CM | POA: Diagnosis not present

## 2020-10-04 DIAGNOSIS — Z170 Estrogen receptor positive status [ER+]: Secondary | ICD-10-CM | POA: Diagnosis not present

## 2020-10-04 DIAGNOSIS — E1165 Type 2 diabetes mellitus with hyperglycemia: Secondary | ICD-10-CM | POA: Diagnosis not present

## 2020-10-04 DIAGNOSIS — E782 Mixed hyperlipidemia: Secondary | ICD-10-CM | POA: Diagnosis not present

## 2020-10-04 DIAGNOSIS — Z0001 Encounter for general adult medical examination with abnormal findings: Secondary | ICD-10-CM | POA: Diagnosis not present

## 2020-10-05 LAB — LIPID PANEL WITH LDL/HDL RATIO
Cholesterol, Total: 168 mg/dL (ref 100–199)
HDL: 66 mg/dL (ref >39)
LDL Chol Calc (NIH): 87 mg/dL (ref 0–99)
LDL/HDL Ratio: 1.3 ratio (ref 0.0–3.2)
Triglycerides: 78 mg/dL (ref 0–149)
VLDL Cholesterol Cal: 15 mg/dL (ref 5–40)

## 2020-10-05 LAB — TSH+FREE T4
Free T4: 1.49 ng/dL (ref 0.82–1.77)
TSH: 2.19 u[IU]/mL (ref 0.450–4.500)

## 2020-10-05 LAB — MICROALBUMIN, URINE: Microalbumin, Urine: 18.7 ug/mL

## 2020-10-09 ENCOUNTER — Other Ambulatory Visit: Payer: Self-pay

## 2020-10-09 ENCOUNTER — Ambulatory Visit (INDEPENDENT_AMBULATORY_CARE_PROVIDER_SITE_OTHER): Payer: Medicare Other | Admitting: Physician Assistant

## 2020-10-09 ENCOUNTER — Encounter: Payer: Self-pay | Admitting: Physician Assistant

## 2020-10-09 DIAGNOSIS — R0989 Other specified symptoms and signs involving the circulatory and respiratory systems: Secondary | ICD-10-CM | POA: Diagnosis not present

## 2020-10-09 DIAGNOSIS — I10 Essential (primary) hypertension: Secondary | ICD-10-CM | POA: Diagnosis not present

## 2020-10-09 DIAGNOSIS — Z9011 Acquired absence of right breast and nipple: Secondary | ICD-10-CM | POA: Diagnosis not present

## 2020-10-09 DIAGNOSIS — E1165 Type 2 diabetes mellitus with hyperglycemia: Secondary | ICD-10-CM

## 2020-10-09 DIAGNOSIS — C50911 Malignant neoplasm of unspecified site of right female breast: Secondary | ICD-10-CM

## 2020-10-09 DIAGNOSIS — Z170 Estrogen receptor positive status [ER+]: Secondary | ICD-10-CM

## 2020-10-09 LAB — POCT GLYCOSYLATED HEMOGLOBIN (HGB A1C): Hemoglobin A1C: 6.1 % — AB (ref 4.0–5.6)

## 2020-10-09 MED ORDER — METFORMIN HCL ER 500 MG PO TB24: 500.0000 mg | ORAL_TABLET | Freq: Every day | ORAL | 1 refills | Status: DC
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
## 2020-10-25 ENCOUNTER — Ambulatory Visit (INDEPENDENT_AMBULATORY_CARE_PROVIDER_SITE_OTHER): Payer: Medicare Other

## 2020-10-25 ENCOUNTER — Other Ambulatory Visit: Payer: Self-pay

## 2020-10-25 DIAGNOSIS — R0989 Other specified symptoms and signs involving the circulatory and respiratory systems: Secondary | ICD-10-CM

## 2020-11-06 ENCOUNTER — Ambulatory Visit
Admission: RE | Admit: 2020-11-06 | Discharge: 2020-11-06 | Disposition: A | Discharge status: Home / Self Care | Payer: Medicare Other | Source: Ambulatory Visit | Attending: Oncology | Admitting: Oncology

## 2020-11-06 ENCOUNTER — Other Ambulatory Visit: Payer: Self-pay

## 2020-11-06 DIAGNOSIS — Z08 Encounter for follow-up examination after completed treatment for malignant neoplasm: Secondary | ICD-10-CM | POA: Insufficient documentation

## 2020-11-06 DIAGNOSIS — M8589 Other specified disorders of bone density and structure, multiple sites: Secondary | ICD-10-CM | POA: Diagnosis not present

## 2020-11-06 DIAGNOSIS — Z79811 Long term (current) use of aromatase inhibitors: Secondary | ICD-10-CM | POA: Diagnosis not present

## 2020-11-06 DIAGNOSIS — Z853 Personal history of malignant neoplasm of breast: Secondary | ICD-10-CM | POA: Insufficient documentation

## 2020-11-08 ENCOUNTER — Telehealth: Payer: Self-pay | Admitting: Oncology

## 2020-11-08 NOTE — Telephone Encounter (Signed)
Left VM with patient to please return call in order to r/s her appt on 7/15 (out by 2 weeks).

## 2020-11-13 ENCOUNTER — Other Ambulatory Visit: Payer: Self-pay | Admitting: Internal Medicine

## 2020-11-13 ENCOUNTER — Inpatient Hospital Stay: Payer: No Typology Code available for payment source | Admitting: Oncology

## 2020-11-13 DIAGNOSIS — I10 Essential (primary) hypertension: Secondary | ICD-10-CM

## 2020-11-17 NOTE — Procedures (Signed)
[place], [place] [id number]  DATE OF SERVICE: 10/25/2020  CAROTID DOPPLER INTERPRETATION:  Bilateral Carotid Ultrsasound and Color Doppler Examination was performed. The RIGHT CCA shows no significant plaque in the vessel. The LEFT CCA shows mild plaque in the vessel. There was no significant intimal thickening noted in the RIGHT carotid artery. There was no significant intimal thickening in the LEFT carotid artery.  The RIGHT CCA shows peak systolic velocity of 99991111 cm per second. The end diastolic velocity is 14 cm per second on the RIGHT side. The RIGHT ICA shows peak systolic velocity of 123456 per second. RIGHT sided ICA end diastolic velocity is 33 cm per second. The RIGHT ECA shows a peak systolic velocity of 73 cm per second. The ICA/CCA ratio is calculated to be 0.94. This suggests less than 50% stenosis. The Vertebral Artery shows antegrade flow.  The LEFT CCA shows peak systolic velocity of 91 cm per second. The end diastolic velocity is 23 cm per second on the LEFT side. The LEFT ICA shows peak systolic velocity of 90 per second. LEFT sided ICA end diastolic velocity is 33 cm per second. The LEFT ECA shows a peak systolic velocity of 63 cm per second. The ICA/CCA ratio is calculated to be 0.99. This suggests less than 50% stenosis. The Vertebral Artery shows antegrade flow.   Impression:    The RIGHT CAROTID shows less than 50% stenosis. The LEFT CAROTID shows less than 50% stenosis.  There is no significant plaque formation noted on the LEFT and no significant plaque on the RIGHT  side. Consider a repeat Carotid doppler if clinical situation and symptoms warrant in 6-12 months. Patient should be encouraged to change lifestyles such as smoking cessation, regular exercise and dietary modification. Use of statins in the right clinical setting and ASA is encouraged.  Allyne Gee, MD [facility] Pulmonary Critical Care Medicine

## 2020-11-20 ENCOUNTER — Other Ambulatory Visit: Payer: Self-pay

## 2020-11-20 MED ORDER — ACCU-CHEK FASTCLIX LANCETS MISC: 0 refills | Status: DC

## 2020-11-27 ENCOUNTER — Encounter: Payer: Self-pay | Admitting: Hematology and Oncology

## 2020-12-05 ENCOUNTER — Other Ambulatory Visit: Payer: Self-pay

## 2020-12-05 ENCOUNTER — Inpatient Hospital Stay: Payer: Medicare Other | Attending: Oncology | Admitting: Oncology

## 2020-12-05 VITALS — BP 141/76 | HR 98 | Temp 97.8°F | Resp 18 | Wt 152.0 lb

## 2020-12-05 DIAGNOSIS — Z87442 Personal history of urinary calculi: Secondary | ICD-10-CM | POA: Insufficient documentation

## 2020-12-05 DIAGNOSIS — Z79899 Other long term (current) drug therapy: Secondary | ICD-10-CM | POA: Insufficient documentation

## 2020-12-05 DIAGNOSIS — Z08 Encounter for follow-up examination after completed treatment for malignant neoplasm: Secondary | ICD-10-CM

## 2020-12-05 DIAGNOSIS — Z9221 Personal history of antineoplastic chemotherapy: Secondary | ICD-10-CM | POA: Diagnosis not present

## 2020-12-05 DIAGNOSIS — Z801 Family history of malignant neoplasm of trachea, bronchus and lung: Secondary | ICD-10-CM | POA: Insufficient documentation

## 2020-12-05 DIAGNOSIS — Z79811 Long term (current) use of aromatase inhibitors: Secondary | ICD-10-CM | POA: Insufficient documentation

## 2020-12-05 DIAGNOSIS — Z1231 Encounter for screening mammogram for malignant neoplasm of breast: Secondary | ICD-10-CM | POA: Diagnosis not present

## 2020-12-05 DIAGNOSIS — Z170 Estrogen receptor positive status [ER+]: Secondary | ICD-10-CM | POA: Diagnosis not present

## 2020-12-05 DIAGNOSIS — Z923 Personal history of irradiation: Secondary | ICD-10-CM | POA: Insufficient documentation

## 2020-12-05 DIAGNOSIS — Z7984 Long term (current) use of oral hypoglycemic drugs: Secondary | ICD-10-CM | POA: Diagnosis not present

## 2020-12-05 DIAGNOSIS — E119 Type 2 diabetes mellitus without complications: Secondary | ICD-10-CM | POA: Insufficient documentation

## 2020-12-05 DIAGNOSIS — R531 Weakness: Secondary | ICD-10-CM | POA: Diagnosis not present

## 2020-12-05 DIAGNOSIS — I10 Essential (primary) hypertension: Secondary | ICD-10-CM

## 2020-12-05 DIAGNOSIS — Z853 Personal history of malignant neoplasm of breast: Secondary | ICD-10-CM | POA: Diagnosis not present

## 2020-12-05 DIAGNOSIS — K219 Gastro-esophageal reflux disease without esophagitis: Secondary | ICD-10-CM | POA: Diagnosis not present

## 2020-12-05 DIAGNOSIS — Z803 Family history of malignant neoplasm of breast: Secondary | ICD-10-CM | POA: Diagnosis not present

## 2020-12-05 DIAGNOSIS — C50911 Malignant neoplasm of unspecified site of right female breast: Secondary | ICD-10-CM | POA: Insufficient documentation

## 2020-12-05 DIAGNOSIS — M810 Age-related osteoporosis without current pathological fracture: Secondary | ICD-10-CM | POA: Diagnosis not present

## 2020-12-05 DIAGNOSIS — R5383 Other fatigue: Secondary | ICD-10-CM | POA: Insufficient documentation

## 2020-12-05 DIAGNOSIS — R232 Flushing: Secondary | ICD-10-CM | POA: Diagnosis not present

## 2020-12-05 DIAGNOSIS — Z86711 Personal history of pulmonary embolism: Secondary | ICD-10-CM | POA: Insufficient documentation

## 2020-12-05 MED ORDER — LOSARTAN POTASSIUM 50 MG PO TABS: ORAL_TABLET | ORAL | 1 refills | Status: DC

## 2020-12-05 NOTE — Progress Notes (Signed)
6 month follow-up. No new concerns today. Had bone density test in July.

## 2020-12-05 NOTE — Progress Notes (Signed)
Hematology/Oncology Consult note [facility]  [phone] [phone]  Patient Care Team: Lavera Guise, MD as PCP - General (Internal Medicine) Christene Lye, MD (General Surgery) Lavera Guise, MD (Internal Medicine) Lequita Asal, MD (Inactive) as Referring Physician (Hematology and Oncology)   Name of the patient: Abigail Wheeler  [id number]  03-03-1951   Date of visit: 12/05/20  Diagnosis- history of stage IIIb right breast cancer currently on Femara  Chief complaint/ Reason for visit-routine follow-up of breast cancer  Heme/Onc history: Patient is a 70 year old female who sees Dr. Mike Gip for her history of stage IIIb right breast cancer which was diagnosed in January 2015.  She is status post right mastectomy and sentinel lymph node biopsy.  Pathology showed grade 1 lobular carcinoma multifocal, largest focus was 1.7 cm invading the pectoralis muscle additional foci were 1.4 cm and 1 mm respectively.  Sentinel lymph node was also positive with extracapsular extension.  Tumor was ER PR positive and HER-2/neu negative.  Patient received dose dense AC followed by 12 weeks of Taxol.  She also received adjuvant radiation to her chest and axilla.  She then started taking Femara.  Patient also diagnosed with multiple left lower lobe pulmonary emboli in April 2015.  Hypercoagulable work-up was negative and patient is on Xarelto.  Bone density scan in May 2018 showed osteopenia with a score of -1.9.  Patient was not a candidate for FRAX score due to Femara.  Interval history-Abigail Wheeler is doing well today.  Denies any new breast concerns.  She is tolerating Femara well.  Describes occasional hot flashes over the past few months.  Has been having some concerns about her blood pressure and has reached out to her PCP on several occasions.  States her blood pressure medicine was recently decreased and she is unsure why.   ECOG PS- 1Pain  scale- 0 Opioid associated constipation- no  Review of systems- Review of Systems  Constitutional:  Positive for malaise/fatigue. Negative for chills, fever and weight loss.  HENT:  Negative for congestion, ear pain and tinnitus.   Eyes: Negative.  Negative for blurred vision and double vision.  Respiratory: Negative.  Negative for cough, sputum production and shortness of breath.   Cardiovascular: Negative.  Negative for chest pain, palpitations and leg swelling.  Gastrointestinal: Negative.  Negative for abdominal pain, constipation, diarrhea, nausea and vomiting.  Genitourinary:  Negative for dysuria, frequency and urgency.  Musculoskeletal:  Negative for back pain and falls.  Skin: Negative.  Negative for rash.  Neurological:  Negative for sensory change, weakness and headaches.  Endo/Heme/Allergies: Negative.  Does not bruise/bleed easily.       Occasional hot flash  Psychiatric/Behavioral: Negative.  Negative for depression. The patient is not nervous/anxious and does not have insomnia.       No Known Allergies   Past Medical History:  Diagnosis Date   Arthritis    Breast cancer ([place]) 2015   chemo and radiation   Cancer ([place]) 05-20-13   T1 N1 ER positive/PR negative HER2 negative. right breast   Diabetes mellitus without complication (HCC)    GERD (gastroesophageal reflux disease)    Hypertension    PE (pulmonary embolism)    Personal history of chemotherapy 2015   Right breast   Personal history of radiation therapy 2015   Right breast   Renal calculi      Past Surgical History:  Procedure Laterality Date   BREAST BIOPSY Right 04-05-13  positive   BREAST SURGERY Right 05/20/13   mastectomy   CESAREAN SECTION  1991   COLONOSCOPY WITH PROPOFOL N/A 12/30/2018   Procedure: COLONOSCOPY WITH PROPOFOL;  Surgeon: Virgel Manifold, MD;  Location: [facility] ENDOSCOPY;  Service: Endoscopy;  Laterality: N/A;   MASTECTOMY Right 2015   chemo and radiation    Social History    Socioeconomic History   Marital status: Married    Spouse name: Not on file   Number of children: Not on file   Years of education: Not on file   Highest education level: Not on file  Occupational History   Not on file  Tobacco Use   Smoking status: Never   Smokeless tobacco: Never  Vaping Use   Vaping Use: Never used  Substance and Sexual Activity   Alcohol use: No   Drug use: No   Sexual activity: Yes  Other Topics Concern   Not on file  Social History Narrative   Not on file   Social Determinants of Health   Financial Resource Strain: Not on file  Food Insecurity: Not on file  Transportation Needs: Not on file  Physical Activity: Not on file  Stress: Not on file  Social Connections: Not on file  Intimate Partner Violence: Not on file    Family History  Problem Relation Age of Onset   Lung cancer Father    Breast cancer Maternal Grandmother 68     Current Outpatient Medications:    Accu-Chek FastClix Lancets MISC, Use as directed for once a day, Disp: 100 each, Rfl: 0   acetaminophen (TYLENOL) 500 MG tablet, Take 500 mg by mouth every 6 (six) hours as needed., Disp: , Rfl:    calcium carbonate (OS-CAL) 600 MG TABS tablet, Take 600 mg by mouth daily., Disp: , Rfl:    Cholecalciferol (VITAMIN D-3) 1000 UNITS CAPS, Take 1 capsule by mouth daily., Disp: , Rfl:    glucose blood (ACCU-CHEK GUIDE) test strip, 1 each by Other route daily. Use as instructed to check blood sugar once daily   E11.65, Disp: 100 strip, Rfl: 1   Lancets (FREESTYLE) lancets, 1 each by Other route as needed for other. Use as instructed DX E11.65, Disp: 100 each, Rfl: 5   letrozole (FEMARA) 2.5 MG tablet, TAKE 1 TABLET BY MOUTH EVERY DAY, Disp: 90 tablet, Rfl: 4   losartan (COZAAR) 50 MG tablet, TAKE 1 TABLET BY MOUTH EVERY DAY, Disp: 90 tablet, Rfl: 1   metFORMIN (GLUCOPHAGE-XR) 500 MG 24 hr tablet, Take 1 tablet (500 mg total) by mouth daily with breakfast., Disp: 90 tablet, Rfl: 1    pyridoxine (B-6) 100 MG tablet, Vitamin B-6 100 mg tablet  Take 1 tablet twice a day by oral route., Disp: , Rfl:   Physical exam:  Vitals:   12/05/20 1130  BP: (!) 141/76  Pulse: 98  Resp: 18  Temp: 97.8 F (36.6 C)  TempSrc: Tympanic  SpO2: 98%  Weight: 152 lb (68.9 kg)   Physical Exam Constitutional:      Appearance: Normal appearance.  Cardiovascular:     Rate and Rhythm: Normal rate.  Pulmonary:     Effort: Pulmonary effort is normal.     Breath sounds: Normal breath sounds.  Abdominal:     General: Bowel sounds are normal.     Palpations: Abdomen is soft.  Musculoskeletal:        General: No swelling.  Neurological:     Mental Status: She is alert and oriented to  person, place, and time. Mental status is at baseline.    No palpable bilateral axillary adenopathy.  CMP Latest Ref Rng & Units 11/03/2018  Glucose 70 - 99 mg/dL 141(H)  BUN 8 - 23 mg/dL 14  Creatinine 0.44 - 1.00 mg/dL 0.99  Sodium 135 - 145 mmol/L 143  Potassium 3.5 - 5.1 mmol/L 3.5  Chloride 98 - 111 mmol/L 107  CO2 22 - 32 mmol/L 27  Calcium 8.9 - 10.3 mg/dL 10.0  Total Protein 6.5 - 8.1 g/dL 7.9  Total Bilirubin 0.3 - 1.2 mg/dL 0.6  Alkaline Phos 38 - 126 U/L 54  AST 15 - 41 U/L 26  ALT 0 - 44 U/L 32   CBC Latest Ref Rng & Units 11/03/2018  WBC 4.0 - 10.5 K/uL 4.4  Hemoglobin 12.0 - 15.0 g/dL 13.2  Hematocrit 36.0 - 46.0 % 38.8  Platelets 150 - 400 K/uL 216    No images are attached to the encounter.  DG Bone Density  Result Date: 11/06/2020 EXAM: DUAL X-RAY ABSORPTIOMETRY (DXA) FOR BONE MINERAL DENSITY IMPRESSION: Your patient Kaytlynn Kochan completed a BMD test on 11/06/2020 using the [place] (software version: 14.10) manufactured by [employer]. The following summarizes the results of our evaluation. Technologist: SCE PATIENT BIOGRAPHICAL: Name: Taylin, Mans Patient ID: [id number] Birth Date: January 03, 1951 Height: 61.0 in. Gender: Female Exam Date:  11/06/2020 Weight: 149.6 lbs. Indications: Diabetic, Height Loss, High Risk Meds, History of Breast Cancer, Osteoarthritis, Osteopenia, Postmenopausal, Previous Chemo and Radiation, Vitamin D Deficiency Fractures: Right toe Treatments: Calcium, femara, Metformin, Vitamin D DENSITOMETRY RESULTS: Site      Region     Measured Date Measured Age WHO Classification Young Adult T-score BMD         %Change vs. Previous Significant Change (*) AP Spine L1-L4 (L3) 11/06/2020 71.4 Osteopenia -1.2 1.041 g/cm2 1.1% - AP Spine L1-L4 (L3) 11/05/2018 69.4 Osteopenia -1.3 1.030 g/cm2 -0.5% - AP Spine L1-L4 (L3) 08/27/2016 67.2 Osteopenia -1.2 1.035 g/cm2 0.9% - AP Spine L1-L4 (L3) 03/22/2014 64.7 Osteopenia -1.3 1.026 g/cm2 - - DualFemur Neck Left 11/06/2020 71.4 Osteopenia -2.1 0.748 g/cm2 -7.2% Yes DualFemur Neck Left 11/05/2018 69.4 Osteopenia -1.7 0.806 g/cm2 3.2% - DualFemur Neck Left 08/27/2016 67.2 Osteopenia -1.9 0.781 g/cm2 -4.4% - DualFemur Neck Left 03/22/2014 64.7 Osteopenia -1.6 0.817 g/cm2 - - DualFemur Total Mean 11/06/2020 71.4 Normal -0.8 0.901 g/cm2 -3.9% Yes DualFemur Total Mean 11/05/2018 69.4 Normal -0.6 0.938 g/cm2 -0.4% - DualFemur Total Mean 08/27/2016 67.2 Normal -0.5 0.942 g/cm2 -2.5% Yes DualFemur Total Mean 03/22/2014 64.7 Normal -0.3 0.966 g/cm2 - - ASSESSMENT: The BMD measured at Femur Neck Left is 0.748 g/cm2 with a T-score of -2.1. This patient is considered osteopenic according to [facility]) criteria. The scan quality is good. L3 was excluded due to degenerative changes. Compared with prior study, there has been no significant change in the spine. Compared with prior study, there has been a significant decrease in the total hip. World Pharmacologist [facility]) criteria for post-menopausal, Caucasian Women: Normal:                   T-score at or above -1 SD Osteopenia/low bone mass: T-score between -1 and -2.5 SD Osteoporosis:             T-score at or below -2.5 SD  RECOMMENDATIONS: 1. All patients should optimize calcium and vitamin D intake. 2. Consider FDA-approved medical therapies in postmenopausal women and men aged 65 years  and older, based on the following: a. A hip or vertebral(clinical or morphometric) fracture b. T-score < -2.5 at the femoral neck or spine after appropriate evaluation to exclude secondary causes c. Low bone mass (T-score between -1.0 and -2.5 at the femoral neck or spine) and a 10-year probability of a hip fracture > 3% or a 10-year probability of a major osteoporosis-related fracture > 20% based on the [place]-adapted WHO algorithm 3. Clinician judgment and/or patient preferences may indicate treatment for people with 10-year fracture probabilities above or below these levels FOLLOW-UP: People with diagnosed cases of osteoporosis or at high risk for fracture should have regular bone mineral density tests. For patients eligible for Medicare, routine testing is allowed once every 2 years. The testing frequency can be increased to one year for patients who have rapidly progressing disease, those who are receiving or discontinuing medical therapy to restore bone mass, or have additional risk factors. I have reviewed this report, and agree with the above findings. [facility] Dear Dr Rao, Your patient Mitsuko W Lard completed a FRAX assessment on 11/06/2020 using the Lunar iDXA DXA System (analysis version: 14.10) manufactured by [employer]. The following summarizes the results of our evaluation. PATIENT BIOGRAPHICAL: Name: Soberano, Amiyah W Patient ID: [id number] Birth Date: 05/18/1950 Height:    61.0 in. Gender:     Female    [age]       Weight:    149.6 lbs. Ethnicity:  Black                            Exam Date: 11/06/2020 FRAX* RESULTS:  (version: 3.5) 10-year Probability of Fracture1 Major Osteoporotic Fracture2 Hip Fracture 5.7% 1.2% Population: [place] (Black) Risk Factors: None Based on Femur (Left) Neck BMD 1 -The 10-year  probability of fracture may be lower than reported if the patient has received treatment. 2 -Major Osteoporotic Fracture: Clinical Spine, Forearm, Hip or Shoulder *FRAX is a trademark of the [employer] for Metabolic Bone Disease, a World Health Organization (WHO) Collaborating Centre. ASSESSMENT: The probability of a major osteoporotic fracture is 5.7% within the next ten years. The probability of a hip fracture is 1.2% within the next ten years. . I have reviewed this report and agree with the above findings. Mark A. Boles, M.D. [facility] Electronically Signed   By: Mark  Boles M.D.   On: 11/06/2020 14:05      Assessment and plan- Patient is a 70 y.o. female with history of stage IIIb right breast cancer .  She is status post adjuvant chemotherapy and radiation.  She is currently on Femara and here for a routine follow-up.  Clinically she is doing well with no concerning signs or symptoms of recurrence.  She is taking her Femara as prescribed.  Does report occasional hot flashes over the past several months that are self-limiting.  She recently had a bone density scan which showed slightly worsening bone health with a T score of -2.1 (-1.8).  She has been compliant with her calcium and vitamin D.  Most recent mammogram is from January 2022 which was negative.  She will be due for a repeat mammogram in January 2023.  She will then see Dr. Rao back in approximately 6 months for follow-up.  Hypertension-having some concerns regarding her blood pressure medicine.  States her blood pressure medicine was recently decreased and she is unsure of   why.  Her BP has been running high especially at the doctor's office and she is worried.  She has been trying to reach out to her PCP over the past 2 days.  Awaiting a phone call back.  Visit Diagnosis 1. Encounter for follow-up surveillance of breast cancer   2. Encounter for screening mammogram for malignant neoplasm of  breast     I spent 20 minutes dedicated to the care of this patient (face-to-face and non-face-to-face) on the date of the encounter to include what is described in the assessment and plan.  Faythe Casa, NP 12/05/2020 1:32 PM

## 2020-12-15 DIAGNOSIS — H40003 Preglaucoma, unspecified, bilateral: Secondary | ICD-10-CM | POA: Diagnosis not present

## 2020-12-27 ENCOUNTER — Other Ambulatory Visit: Payer: Self-pay

## 2020-12-27 MED ORDER — ACCU-CHEK GUIDE VI STRP: 1.0000 | ORAL_STRIP | Freq: Every day | 5 refills | Status: DC

## 2021-01-12 ENCOUNTER — Ambulatory Visit: Payer: Medicare Other | Admitting: Physician Assistant

## 2021-02-09 DIAGNOSIS — Z23 Encounter for immunization: Secondary | ICD-10-CM | POA: Diagnosis not present

## 2021-02-23 ENCOUNTER — Encounter: Payer: Self-pay | Admitting: Emergency Medicine

## 2021-02-23 ENCOUNTER — Other Ambulatory Visit: Payer: Self-pay

## 2021-02-23 ENCOUNTER — Telehealth: Payer: Self-pay

## 2021-02-23 ENCOUNTER — Ambulatory Visit
Admission: EM | Admit: 2021-02-23 | Discharge: 2021-02-23 | Disposition: A | Discharge status: Home / Self Care | Payer: Medicare Other | Attending: Medical Oncology | Admitting: Medical Oncology

## 2021-02-23 DIAGNOSIS — J069 Acute upper respiratory infection, unspecified: Secondary | ICD-10-CM | POA: Diagnosis not present

## 2021-02-23 DIAGNOSIS — R0981 Nasal congestion: Secondary | ICD-10-CM

## 2021-02-23 MED ORDER — FLUTICASONE PROPIONATE 50 MCG/ACT NA SUSP: 2.0000 | Freq: Every day | NASAL | 0 refills | Status: DC

## 2021-02-23 MED ORDER — BENZONATATE 100 MG PO CAPS: 100.0000 mg | ORAL_CAPSULE | Freq: Three times a day (TID) | ORAL | 0 refills | Status: DC

## 2021-02-23 NOTE — Telephone Encounter (Signed)
Left vm to confirm 02/27/21 appointment-Toni 

## 2021-02-23 NOTE — ED Triage Notes (Signed)
Patient c/o cough and chest congestion that started on Monday.  Patient denies fevers.

## 2021-02-23 NOTE — ED Provider Notes (Signed)
[facility]    CSN: [id number] Arrival date & time: 02/23/21  1418      History   Chief Complaint Chief Complaint  Patient presents with   Cough    HPI Abigail Wheeler is a 70 y.o. female.   HPI  Cough: Patient states that she has had dry cough and chest congestion that started on Monday.  She has not had any fevers, hemoptysis, shortness of breath or chest pain.  She has tried hydration with water, orange juice, metamucil for symptoms with some improvement but her husband asked for her to come get checked out as she is still coughing.   Past Medical History:  Diagnosis Date   Arthritis    Breast cancer ([place]) 2015   chemo and radiation   Cancer ([place]) 05-20-13   T1 N1 ER positive/PR negative HER2 negative. right breast   Diabetes mellitus without complication ([place])    GERD (gastroesophageal reflux disease)    Hypertension    PE (pulmonary embolism)    Personal history of chemotherapy 2015   Right breast   Personal history of radiation therapy 2015   Right breast   Renal calculi     Patient Active Problem List   Diagnosis Date Noted   Otalgia of left ear 08/02/2019   Polyp of sigmoid colon    Rectal polyp    Diverticulosis of large intestine without diverticulitis    Encounter for screening colonoscopy 10/24/2018   Type 2 diabetes mellitus with hyperglycemia ([place]) 10/24/2018   Bilateral carpal tunnel syndrome 06/25/2018   Osteoarthritis of carpometacarpal ([place]) joint of thumb 06/25/2018   Dysuria 08/19/2017   Essential hypertension 04/23/2017   Urinary tract infection 04/23/2017   Type 2 diabetes mellitus without complications ([place]) 15/40/0867   Vitamin D deficiency 04/23/2017   Cardiac arrhythmia 04/23/2017   Dyspnea 04/23/2017   Hypokalemia 04/23/2017   Osteopenia 02/25/2017   Pulmonary embolism on left ([place]) 08/08/2013   Other pulmonary embolism without acute cor pulmonale ([place]) 08/08/2013   Breast cancer, right ([place]) 05/11/2013   Malignant  neoplasm of breast (female) ([place]) 04/09/2013    Past Surgical History:  Procedure Laterality Date   BREAST BIOPSY Right 04-05-13   positive   BREAST SURGERY Right 05/20/13   mastectomy   CESAREAN SECTION  1991   COLONOSCOPY WITH PROPOFOL N/A 12/30/2018   Procedure: COLONOSCOPY WITH PROPOFOL;  Surgeon: Virgel Manifold, MD;  Location: [facility] ENDOSCOPY;  Service: Endoscopy;  Laterality: N/A;   MASTECTOMY Right 2015   chemo and radiation    OB History     Gravida  2   Para  2   Term      Preterm      AB      Living         SAB      IAB      Ectopic      Multiple      Live Births           Obstetric Comments  1st Menstrual Cycle:  11  1st Pregnancy:  27          Home Medications    Prior to Admission medications   Medication Sig Start Date End Date Taking? Authorizing Provider  calcium carbonate (OS-CAL) 600 MG TABS tablet Take 600 mg by mouth daily.   Yes [provider]  Cholecalciferol (VITAMIN D-3) 1000 UNITS CAPS Take 1 capsule by mouth daily.   Yes [provider]  letrozole [facility])  2.5 MG tablet TAKE 1 TABLET BY MOUTH EVERY DAY 06/09/20  Yes Sindy Guadeloupe, MD  losartan (COZAAR) 50 MG tablet TAKE 1 TABLET BY MOUTH EVERY DAY 12/05/20  Yes McDonough, Lauren K, PA-C  metFORMIN (GLUCOPHAGE-XR) 500 MG 24 hr tablet Take 1 tablet (500 mg total) by mouth daily with breakfast. 10/09/20  Yes McDonough, Lauren K, PA-C  pyridoxine (B-6) 100 MG tablet Vitamin B-6 100 mg tablet  Take 1 tablet twice a day by oral route.   Yes [provider]  Accu-Chek FastClix Lancets MISC Use as directed for once a day 11/20/20   Lavera Guise, MD  acetaminophen (TYLENOL) 500 MG tablet Take 500 mg by mouth every 6 (six) hours as needed.    [provider]  glucose blood (ACCU-CHEK GUIDE) test strip 1 each by Other route daily. Use as instructed to check blood sugar once daily   E11.65 12/27/20   Lavera Guise, MD  Lancets (FREESTYLE) lancets 1 each  by Other route as needed for other. Use as instructed DX E11.65 05/02/20   Ronnell Freshwater, NP    Family History Family History  Problem Relation Age of Onset   Lung cancer Father    Breast cancer Maternal Grandmother 68    Social History Social History   Tobacco Use   Smoking status: Never   Smokeless tobacco: Never  Vaping Use   Vaping Use: Never used  Substance Use Topics   Alcohol use: No   Drug use: No     Allergies   Patient has no known allergies.   Review of Systems Review of Systems  As stated above in HPI Physical Exam Triage Vital Signs ED Triage Vitals  Enc Vitals Group     BP 02/23/21 1517 137/65     Pulse Rate 02/23/21 1517 (!) 106     Resp 02/23/21 1517 14     Temp 02/23/21 1517 98.8 F (37.1 C)     Temp Source 02/23/21 1517 Oral     SpO2 02/23/21 1517 99 %     Weight 02/23/21 1514 159 lb (72.1 kg)     Height 02/23/21 1514 $RemoveBefor'5\' 1"'lLjJpfMGcenu$  (1.549 m)     Head Circumference --      Peak Flow --      Pain Score 02/23/21 1514 0     Pain Loc --      Pain Edu? --      Excl. in [place]? --    No data found.  Updated Vital Signs BP 137/65 (BP Location: Left Arm)   Pulse (!) 106   Temp 98.8 F (37.1 C) (Oral)   Resp 14   Ht $R'5\' 1"'Px$  (1.549 m)   Wt 159 lb (72.1 kg)   SpO2 99%   BMI 30.04 kg/m   Physical Exam Vitals and nursing note reviewed.  Constitutional:      General: She is not in acute distress.    Appearance: Normal appearance. She is not ill-appearing, toxic-appearing or diaphoretic.  HENT:     Head: Normocephalic and atraumatic.     Right Ear: Tympanic membrane normal.     Left Ear: Tympanic membrane normal.     Nose: Congestion and rhinorrhea present.     Mouth/Throat:     Mouth: Mucous membranes are moist.     Pharynx: Oropharynx is clear. No oropharyngeal exudate or posterior oropharyngeal erythema.  Eyes:     Extraocular Movements: Extraocular movements intact.     Conjunctiva/sclera: Conjunctivae normal.  Pupils: Pupils are equal,  round, and reactive to light.  Cardiovascular:     Rate and Rhythm: Normal rate and regular rhythm.     Heart sounds: Normal heart sounds.  Pulmonary:     Effort: Pulmonary effort is normal.     Breath sounds: Normal breath sounds.  Musculoskeletal:     Cervical back: Normal range of motion and neck supple.  Lymphadenopathy:     Cervical: No cervical adenopathy.  Neurological:     Mental Status: She is alert and oriented to person, place, and time.     UC Treatments / Results  Labs (all labs ordered are listed, but only abnormal results are displayed) Labs Reviewed - No data to display  EKG   Radiology No results found.  Procedures Procedures (including critical care time)  Medications Ordered in UC Medications - No data to display  Initial Impression / Assessment and Plan / UC Course  I have reviewed the triage vital signs and the nursing notes.  Pertinent labs & imaging results that were available during my care of the patient were reviewed by me and considered in my medical decision making (see chart for details).     New.  Likely viral in nature which is resolving as expected.  Patient would like greater assistance with her lingering cough and nasal congestion/runny nose.  Treating with Tessalon and Flonase.  She can continue her rest, hydration and symptom monitoring.  We discussed red flag signs and symptoms. Final Clinical Impressions(s) / UC Diagnoses   Final diagnoses:  None   Discharge Instructions   None    ED Prescriptions   None    PDMP not reviewed this encounter.   Hughie Closs, [place] 02/23/21 1630

## 2021-02-27 ENCOUNTER — Ambulatory Visit: Payer: Medicare Other | Admitting: Internal Medicine

## 2021-02-27 ENCOUNTER — Other Ambulatory Visit: Payer: Self-pay

## 2021-02-27 ENCOUNTER — Encounter: Payer: Self-pay | Admitting: Internal Medicine

## 2021-02-27 VITALS — BP 139/80 | HR 100 | Temp 98.6°F | Resp 16 | Ht 61.0 in | Wt 151.0 lb

## 2021-02-27 DIAGNOSIS — M8589 Other specified disorders of bone density and structure, multiple sites: Secondary | ICD-10-CM

## 2021-02-27 DIAGNOSIS — Z0001 Encounter for general adult medical examination with abnormal findings: Secondary | ICD-10-CM

## 2021-02-27 DIAGNOSIS — R300 Dysuria: Secondary | ICD-10-CM

## 2021-02-27 DIAGNOSIS — E1165 Type 2 diabetes mellitus with hyperglycemia: Secondary | ICD-10-CM

## 2021-02-27 DIAGNOSIS — Z170 Estrogen receptor positive status [ER+]: Secondary | ICD-10-CM

## 2021-02-27 DIAGNOSIS — I10 Essential (primary) hypertension: Secondary | ICD-10-CM

## 2021-02-27 LAB — POCT GLYCOSYLATED HEMOGLOBIN (HGB A1C): Hemoglobin A1C: 6.1 % — AB (ref 4.0–5.6)

## 2021-02-27 MED ORDER — METFORMIN HCL ER 500 MG PO TB24: 500.0000 mg | ORAL_TABLET | Freq: Every day | ORAL | 3 refills | Status: DC

## 2021-02-27 MED ORDER — ROSUVASTATIN CALCIUM 5 MG PO TABS: ORAL_TABLET | ORAL | 3 refills | Status: DC

## 2021-02-27 MED ORDER — LETROZOLE 2.5 MG PO TABS: 2.5000 mg | ORAL_TABLET | Freq: Every day | ORAL | 4 refills | Status: DC

## 2021-02-27 MED ORDER — LOSARTAN POTASSIUM 50 MG PO TABS: ORAL_TABLET | ORAL | 3 refills | Status: DC

## 2021-02-27 NOTE — Progress Notes (Signed)
[facility], [place] [address]  Internal MEDICINE  Office Visit Note  Patient Name: Abigail Wheeler  [id number]  [id number]  Date of Service: 02/27/2021  Chief Complaint  Patient presents with   Medicare Wellness   Diabetes   Hypertension     HPI Pt is here for routine health maintenance examination History of stage IIIb right breast cancer currently on Femara She feels well, she needs refills on her medications. Diabetes is well controlled.BP is slightly elevated. She is not on a statin, no vascular study on file. Colonoscopy last year 2020, BMD in 2020 She history of mastectomy on the right side for invasive carcinoma by Dr. Jamal Collin 6 years ago.  She did receive adjuvant chemotherapy.  She is currently on Femara and Dr. Janese Banks is following her. she is doing very well.  Denies any breast masses.  No nipple discharge.  No fevers no chills no weight loss.  Mammogram personally reviewed showing no evidence of any concerning lesions. Current Medication: Outpatient Encounter Medications as of 02/27/2021  Medication Sig   Accu-Chek FastClix Lancets MISC Use as directed for once a day   acetaminophen (TYLENOL) 500 MG tablet Take 500 mg by mouth every 6 (six) hours as needed.   benzonatate (TESSALON) 100 MG capsule Take 1 capsule (100 mg total) by mouth every 8 (eight) hours.   calcium carbonate (OS-CAL) 600 MG TABS tablet Take 600 mg by mouth daily.   Cholecalciferol (VITAMIN D-3) 1000 UNITS CAPS Take 1 capsule by mouth daily.   fluticasone (FLONASE) 50 MCG/ACT nasal spray Place 2 sprays into both nostrils daily.   glucose blood (ACCU-CHEK GUIDE) test strip 1 each by Other route daily. Use as instructed to check blood sugar once daily   E11.65   letrozole (FEMARA) 2.5 MG tablet TAKE 1 TABLET BY MOUTH EVERY DAY   losartan (COZAAR) 50 MG tablet TAKE 1 TABLET BY MOUTH EVERY DAY   metFORMIN (GLUCOPHAGE-XR) 500 MG 24 hr tablet Take 1 tablet (500 mg total) by  mouth daily with breakfast.   pyridoxine (B-6) 100 MG tablet Vitamin B-6 100 mg tablet  Take 1 tablet twice a day by oral route.   [DISCONTINUED] Lancets (FREESTYLE) lancets 1 each by Other route as needed for other. Use as instructed DX E11.65   No facility-administered encounter medications on file as of 02/27/2021.    Surgical History: Past Surgical History:  Procedure Laterality Date   BREAST BIOPSY Right 04-05-13   positive   BREAST SURGERY Right 05/20/13   mastectomy   CESAREAN SECTION  1991   COLONOSCOPY WITH PROPOFOL N/A 12/30/2018   Procedure: COLONOSCOPY WITH PROPOFOL;  Surgeon: Virgel Manifold, MD;  Location: [facility] ENDOSCOPY;  Service: Endoscopy;  Laterality: N/A;   MASTECTOMY Right 2015   chemo and radiation    Medical History: Past Medical History:  Diagnosis Date   Arthritis    Breast cancer ([place]) 2015   chemo and radiation   Cancer ([place]) 05-20-13   T1 N1 ER positive/PR negative HER2 negative. right breast   Diabetes mellitus without complication (HCC)    GERD (gastroesophageal reflux disease)    Hypertension    PE (pulmonary embolism)    Personal history of chemotherapy 2015   Right breast   Personal history of radiation therapy 2015   Right breast   Renal calculi     Family History: Family History  Problem Relation Age of Onset   Lung cancer Father    Breast cancer  Maternal Grandmother 68    Social History: Social History   Socioeconomic History   Marital status: Married    Spouse name: Not on file   Number of children: Not on file   Years of education: Not on file   Highest education level: Not on file  Occupational History   Not on file  Tobacco Use   Smoking status: Never   Smokeless tobacco: Never  Vaping Use   Vaping Use: Never used  Substance and Sexual Activity   Alcohol use: No   Drug use: No   Sexual activity: Yes  Other Topics Concern   Not on file  Social History Narrative   Not on file   Social Determinants of Health    Financial Resource Strain: Not on file  Food Insecurity: Not on file  Transportation Needs: Not on file  Physical Activity: Not on file  Stress: Not on file  Social Connections: Not on file      Review of Systems  Constitutional:  Negative for activity change, appetite change, chills, diaphoresis, fatigue, fever and unexpected weight change.  HENT:  Negative for congestion, ear discharge, ear pain, facial swelling, hearing loss, mouth sores, nosebleeds, postnasal drip, rhinorrhea, sinus pressure, sinus pain, sneezing, sore throat, tinnitus, trouble swallowing and voice change.   Eyes:  Negative for photophobia, pain, discharge, redness, itching and visual disturbance.  Respiratory:  Negative for apnea, cough, choking, chest tightness, shortness of breath, wheezing and stridor.   Cardiovascular:  Negative for chest pain, palpitations and leg swelling.  Gastrointestinal:  Negative for abdominal distention, abdominal pain, anal bleeding, constipation, diarrhea, nausea and rectal pain.  Endocrine: Negative for cold intolerance, heat intolerance, polydipsia, polyphagia and polyuria.  Genitourinary:  Negative for difficulty urinating, flank pain, frequency, genital sores, hematuria, menstrual problem, pelvic pain, urgency, vaginal bleeding, vaginal discharge and vaginal pain.  Musculoskeletal:  Negative for arthralgias, back pain, gait problem, joint swelling, myalgias and neck pain.  Skin:  Negative for color change, pallor, rash and wound.  Allergic/Immunologic: Negative for environmental allergies, food allergies and immunocompromised state.  Neurological:  Negative for dizziness, seizures, syncope, facial asymmetry, speech difficulty, weakness, light-headedness, numbness and headaches.  Hematological:  Negative for adenopathy. Does not bruise/bleed easily.  Psychiatric/Behavioral: Negative.  Negative for agitation, behavioral problems, confusion, decreased concentration, dysphoric mood,  hallucinations, self-injury, sleep disturbance and suicidal ideas. The patient is not nervous/anxious and is not hyperactive.     Vital Signs: BP (!) 150/90   Pulse 100   Temp 98.6 F (37 C)   Resp 16   Ht _0  (1.549 m)   Wt 151 lb (68.5 kg)   SpO2 99%   BMI 28.53 kg/m    Physical Exam Constitutional:      General: She is not in acute distress.    Appearance: She is well-developed. She is not diaphoretic.  HENT:     Head: Normocephalic and atraumatic.     Right Ear: External ear normal.     Left Ear: External ear normal.     Nose: Nose normal.     Mouth/Throat:     Pharynx: No oropharyngeal exudate.  Eyes:     General: No scleral icterus.       Right eye: No discharge.        Left eye: No discharge.     Conjunctiva/sclera: Conjunctivae normal.     Pupils: Pupils are equal, round, and reactive to light.  Neck:     Thyroid: No thyromegaly.  Vascular: No JVD.     Trachea: No tracheal deviation.  Cardiovascular:     Rate and Rhythm: Normal rate and regular rhythm.     Heart sounds: Normal heart sounds. No murmur heard.   No friction rub. No gallop.  Pulmonary:     Effort: Pulmonary effort is normal. No respiratory distress.     Breath sounds: Normal breath sounds. No stridor. No wheezing or rales.  Chest:     Chest wall: No tenderness.  Abdominal:     General: Bowel sounds are normal. There is no distension.     Palpations: Abdomen is soft. There is no mass.     Tenderness: There is no abdominal tenderness. There is no guarding or rebound.  Musculoskeletal:        General: No tenderness or deformity. Normal range of motion.     Cervical back: Normal range of motion and neck supple.  Lymphadenopathy:     Cervical: No cervical adenopathy.  Skin:    General: Skin is warm and dry.     Coloration: Skin is not pale.     Findings: No erythema or rash.  Neurological:     Mental Status: She is alert.     Cranial Nerves: No cranial nerve deficit.     Motor: No  abnormal muscle tone.     Coordination: Coordination normal.     Deep Tendon Reflexes: Reflexes are normal and symmetric.  Psychiatric:        Behavior: Behavior normal.        Thought Content: Thought content normal.        Judgment: Judgment normal.      Assessment/Plan: 1. Encounter for general adult medical examination with abnormal findings Patient is up-to-date on her preventive health maintenance we will continue monitor along, update labs as indicated  2. Type 2 diabetes mellitus with hyperglycemia, without long-term current use of insulin (HCC) Excellent diabetic control we will continue metformin as before - POCT HgB A1C - metFORMIN (GLUCOPHAGE-XR) 500 MG 24 hr tablet; Take 1 tablet (500 mg total) by mouth daily with breakfast.  Dispense: 90 tablet; Refill: 3  3. Malignant neoplasm of overlapping sites of right breast in female, estrogen receptor positive ([place]) Patient is on Femara continue with oncology for continued  care  4. Osteopenia of multiple sites Patient is on calcium and vitamin D we will continue to monitor along  5. Benign hypertension Prep initial blood pressure is slightly elevated repeat blood pressure was normal however she is instructed to monitor her blood pressure at home we will titrate her medication as needed - losartan (COZAAR) 50 MG tablet; TAKE 1 TABLET BY MOUTH EVERY DAY  Dispense: 90 tablet; Refill: 3   6.Dysuria - UA/M w/rflx Culture, Routine   General Counseling: Magdelene verbalizes understanding of the findings of todays visit and agrees with plan of treatment. I have discussed any further diagnostic evaluation that may be needed or ordered today. We also reviewed her medications today. she has been encouraged to call the office with any questions or concerns that should arise related to todays visit.    Counseling:  [place] Controlled Substance Database was reviewed by me.  Orders Placed This Encounter  Procedures   UA/M w/rflx Culture,  Routine   POCT HgB A1C    No orders of the defined types were placed in this encounter.   Total time spent:45 Minutes  Time spent includes review of chart, medications, test results, and follow up plan with the  patient.     Lavera Guise, MD  Internal Medicine

## 2021-03-03 LAB — UA/M W/RFLX CULTURE, ROUTINE

## 2021-05-02 ENCOUNTER — Other Ambulatory Visit: Payer: Self-pay

## 2021-05-02 ENCOUNTER — Ambulatory Visit
Admission: RE | Admit: 2021-05-02 | Discharge: 2021-05-02 | Disposition: A | Discharge status: Home / Self Care | Payer: Medicare Other | Source: Ambulatory Visit | Attending: Oncology | Admitting: Oncology

## 2021-05-02 DIAGNOSIS — Z9011 Acquired absence of right breast and nipple: Secondary | ICD-10-CM | POA: Diagnosis not present

## 2021-05-02 DIAGNOSIS — Z1231 Encounter for screening mammogram for malignant neoplasm of breast: Secondary | ICD-10-CM | POA: Insufficient documentation

## 2021-05-02 DIAGNOSIS — Z853 Personal history of malignant neoplasm of breast: Secondary | ICD-10-CM | POA: Diagnosis not present

## 2021-05-02 DIAGNOSIS — Z08 Encounter for follow-up examination after completed treatment for malignant neoplasm: Secondary | ICD-10-CM | POA: Diagnosis not present

## 2021-05-07 ENCOUNTER — Encounter: Payer: Self-pay | Admitting: Oncology

## 2021-05-07 ENCOUNTER — Inpatient Hospital Stay: Payer: Medicare Other | Attending: Oncology | Admitting: Oncology

## 2021-05-07 ENCOUNTER — Other Ambulatory Visit: Payer: Self-pay

## 2021-05-07 VITALS — BP 152/85 | HR 94 | Temp 96.2°F | Resp 16 | Ht 61.0 in | Wt 154.2 lb

## 2021-05-07 DIAGNOSIS — Z79818 Long term (current) use of other agents affecting estrogen receptors and estrogen levels: Secondary | ICD-10-CM | POA: Insufficient documentation

## 2021-05-07 DIAGNOSIS — Z7901 Long term (current) use of anticoagulants: Secondary | ICD-10-CM | POA: Insufficient documentation

## 2021-05-07 DIAGNOSIS — Z87442 Personal history of urinary calculi: Secondary | ICD-10-CM | POA: Insufficient documentation

## 2021-05-07 DIAGNOSIS — C50911 Malignant neoplasm of unspecified site of right female breast: Secondary | ICD-10-CM | POA: Diagnosis not present

## 2021-05-07 DIAGNOSIS — Z79811 Long term (current) use of aromatase inhibitors: Secondary | ICD-10-CM | POA: Insufficient documentation

## 2021-05-07 DIAGNOSIS — Z170 Estrogen receptor positive status [ER+]: Secondary | ICD-10-CM | POA: Diagnosis not present

## 2021-05-07 DIAGNOSIS — Z1231 Encounter for screening mammogram for malignant neoplasm of breast: Secondary | ICD-10-CM

## 2021-05-07 DIAGNOSIS — K219 Gastro-esophageal reflux disease without esophagitis: Secondary | ICD-10-CM | POA: Diagnosis not present

## 2021-05-07 DIAGNOSIS — Z08 Encounter for follow-up examination after completed treatment for malignant neoplasm: Secondary | ICD-10-CM | POA: Diagnosis not present

## 2021-05-07 DIAGNOSIS — Z853 Personal history of malignant neoplasm of breast: Secondary | ICD-10-CM

## 2021-05-07 DIAGNOSIS — I10 Essential (primary) hypertension: Secondary | ICD-10-CM | POA: Diagnosis not present

## 2021-05-07 DIAGNOSIS — M8580 Other specified disorders of bone density and structure, unspecified site: Secondary | ICD-10-CM | POA: Insufficient documentation

## 2021-05-07 DIAGNOSIS — E119 Type 2 diabetes mellitus without complications: Secondary | ICD-10-CM | POA: Insufficient documentation

## 2021-05-07 DIAGNOSIS — Z86711 Personal history of pulmonary embolism: Secondary | ICD-10-CM | POA: Insufficient documentation

## 2021-05-07 DIAGNOSIS — Z923 Personal history of irradiation: Secondary | ICD-10-CM | POA: Diagnosis not present

## 2021-05-07 DIAGNOSIS — Z9221 Personal history of antineoplastic chemotherapy: Secondary | ICD-10-CM | POA: Diagnosis not present

## 2021-05-07 NOTE — Progress Notes (Signed)
Hematology/Oncology Consult note [facility]  Telephone:([phone] [phone]  Patient Care Team: Lavera Guise, MD as PCP - General (Internal Medicine) Christene Lye, MD (General Surgery) Lavera Guise, MD (Internal Medicine) Lequita Asal, MD (Inactive) as Referring Physician (Hematology and Oncology)   Name of the patient: Abigail Wheeler  [id number]  25-Sep-1950   Date of visit: 05/07/21  Diagnosis- history of stage IIIb right breast cancer currently on Femara  Chief complaint/ Reason for visit-routine follow-up of breast cancer  Heme/Onc history:  Patient is a 71 year old female who sees Dr. Mike Gip for her history of stage IIIb right breast cancer which was diagnosed in January 2015.  She is status post right mastectomy and sentinel lymph node biopsy.  Pathology showed grade 1 lobular carcinoma multifocal, largest focus was 1.7 cm invading the pectoralis muscle additional foci were 1.4 cm and 1 mm respectively.  Sentinel lymph node was also positive with extracapsular extension.  Tumor was ER PR positive and HER-2/neu negative.  Patient received dose dense AC followed by 12 weeks of Taxol.  She also received adjuvant radiation to her chest and axilla.  She then started taking Femara.  Patient also diagnosed with multiple left lower lobe pulmonary emboli in April 2015.  Hypercoagulable work-up was negative and patient is on Xarelto.  Bone density scan in May 2018 showed osteopenia with a score of -1.9.  Patient was not a candidate for FRAX score due to Femara.    Interval history-patient denies any breast concerns at this time.  Appetite and weight have remained stable.  Denies any new aches and pains anywhere.  She is compliant with her Femara calcium and vitamin D.  ECOG PS- 1 Pain scale- 0   Review of systems- Review of Systems  Constitutional:  Negative for chills, fever, malaise/fatigue and weight loss.  HENT:  Negative for  congestion, ear discharge and nosebleeds.   Eyes:  Negative for blurred vision.  Respiratory:  Negative for cough, hemoptysis, sputum production, shortness of breath and wheezing.   Cardiovascular:  Negative for chest pain, palpitations, orthopnea and claudication.  Gastrointestinal:  Negative for abdominal pain, blood in stool, constipation, diarrhea, heartburn, melena, nausea and vomiting.  Genitourinary:  Negative for dysuria, flank pain, frequency, hematuria and urgency.  Musculoskeletal:  Negative for back pain, joint pain and myalgias.  Skin:  Negative for rash.  Neurological:  Negative for dizziness, tingling, focal weakness, seizures, weakness and headaches.  Endo/Heme/Allergies:  Does not bruise/bleed easily.  Psychiatric/Behavioral:  Negative for depression and suicidal ideas. The patient does not have insomnia.      No Known Allergies   Past Medical History:  Diagnosis Date   Arthritis    Breast cancer ([place]) 2015   chemo and radiation   Cancer ([place]) 05-20-13   T1 N1 ER positive/PR negative HER2 negative. right breast   Diabetes mellitus without complication (HCC)    GERD (gastroesophageal reflux disease)    Hypertension    PE (pulmonary embolism)    Personal history of chemotherapy 2015   Right breast   Personal history of radiation therapy 2015   Right breast   Renal calculi      Past Surgical History:  Procedure Laterality Date   BREAST BIOPSY Right 04-05-13   positive   BREAST SURGERY Right 05/20/13   mastectomy   CESAREAN SECTION  1991   COLONOSCOPY WITH PROPOFOL N/A 12/30/2018   Procedure: COLONOSCOPY WITH PROPOFOL;  Surgeon: Virgel Manifold, MD;  Location: [facility] ENDOSCOPY;  Service: Endoscopy;  Laterality: N/A;   MASTECTOMY Right 2015   chemo and radiation    Social History   Socioeconomic History   Marital status: Married    Spouse name: Not on file   Number of children: Not on file   Years of education: Not on file   Highest education level:  Not on file  Occupational History   Not on file  Tobacco Use   Smoking status: Never   Smokeless tobacco: Never  Vaping Use   Vaping Use: Never used  Substance and Sexual Activity   Alcohol use: No   Drug use: No   Sexual activity: Yes  Other Topics Concern   Not on file  Social History Narrative   Not on file   Social Determinants of Health   Financial Resource Strain: Not on file  Food Insecurity: Not on file  Transportation Needs: Not on file  Physical Activity: Not on file  Stress: Not on file  Social Connections: Not on file  Intimate Partner Violence: Not on file    Family History  Problem Relation Age of Onset   Lung cancer Father    Breast cancer Maternal Grandmother 68     Current Outpatient Medications:    Accu-Chek FastClix Lancets MISC, Use as directed for once a day, Disp: 100 each, Rfl: 0   acetaminophen (TYLENOL) 500 MG tablet, Take 500 mg by mouth every 6 (six) hours as needed., Disp: , Rfl:    calcium carbonate (OS-CAL) 600 MG TABS tablet, Take 600 mg by mouth daily., Disp: , Rfl:    Cholecalciferol (VITAMIN D-3) 1000 UNITS CAPS, Take 1 capsule by mouth daily., Disp: , Rfl:    fluticasone (FLONASE) 50 MCG/ACT nasal spray, Place 2 sprays into both nostrils daily., Disp: 16 mL, Rfl: 0   glucose blood (ACCU-CHEK GUIDE) test strip, 1 each by Other route daily. Use as instructed to check blood sugar once daily   E11.65, Disp: 100 strip, Rfl: 5   letrozole (FEMARA) 2.5 MG tablet, Take 1 tablet (2.5 mg total) by mouth daily., Disp: 90 tablet, Rfl: 4   losartan (COZAAR) 50 MG tablet, TAKE 1 TABLET BY MOUTH EVERY DAY, Disp: 90 tablet, Rfl: 3   metFORMIN (GLUCOPHAGE-XR) 500 MG 24 hr tablet, Take 1 tablet (500 mg total) by mouth daily with breakfast., Disp: 90 tablet, Rfl: 3   pyridoxine (B-6) 100 MG tablet, Vitamin B-6 100 mg tablet  Take 1 tablet twice a day by oral route., Disp: , Rfl:    rosuvastatin (CRESTOR) 5 MG tablet, Take one tab twice a week, Disp: 24  tablet, Rfl: 3   benzonatate (TESSALON) 100 MG capsule, Take 1 capsule (100 mg total) by mouth every 8 (eight) hours. (Patient not taking: Reported on 05/07/2021), Disp: 21 capsule, Rfl: 0  Physical exam:  Vitals:   05/07/21 0951  BP: (!) 152/85  Pulse: 94  Resp: 16  Temp: (!) 96.2 F (35.7 C)  TempSrc: Tympanic  SpO2: 97%  Weight: 154 lb 3.2 oz (69.9 kg)  Height: _0  (1.549 m)   Physical Exam Cardiovascular:     Rate and Rhythm: Normal rate and regular rhythm.     Heart sounds: Normal heart sounds.  Pulmonary:     Effort: Pulmonary effort is normal.     Breath sounds: Normal breath sounds.  Abdominal:     General: Bowel sounds are normal.     Palpations: Abdomen is soft.  Skin:  General: Skin is warm and dry.  Neurological:     Mental Status: She is alert and oriented to person, place, and time.    Breast exam: No palpable masses in the left breast.  No palpable bilateral axillary adenopathy.  Patient is s/p right mastectomy without reconstruction.  No evidence of chest wall recurrence   CMP Latest Ref Rng & Units 11/03/2018  Glucose 70 - 99 mg/dL 141(H)  BUN 8 - 23 mg/dL 14  Creatinine 0.44 - 1.00 mg/dL 0.99  Sodium 135 - 145 mmol/L 143  Potassium 3.5 - 5.1 mmol/L 3.5  Chloride 98 - 111 mmol/L 107  CO2 22 - 32 mmol/L 27  Calcium 8.9 - 10.3 mg/dL 10.0  Total Protein 6.5 - 8.1 g/dL 7.9  Total Bilirubin 0.3 - 1.2 mg/dL 0.6  Alkaline Phos 38 - 126 U/L 54  AST 15 - 41 U/L 26  ALT 0 - 44 U/L 32   CBC Latest Ref Rng & Units 11/03/2018  WBC 4.0 - 10.5 K/uL 4.4  Hemoglobin 12.0 - 15.0 g/dL 13.2  Hematocrit 36.0 - 46.0 % 38.8  Platelets 150 - 400 K/uL 216    No images are attached to the encounter.  MM 3D SCREEN BREAST UNI LEFT  Result Date: 05/02/2021 CLINICAL DATA:  Screening. EXAM: DIGITAL SCREENING UNILATERAL LEFT MAMMOGRAM WITH CAD AND TOMOSYNTHESIS TECHNIQUE: Left screening digital craniocaudal and mediolateral oblique mammograms were obtained. Left  screening digital breast tomosynthesis was performed. The images were evaluated with computer-aided detection. COMPARISON:  Previous exam(s). ACR Breast Density Category b: There are scattered areas of fibroglandular density. FINDINGS: The patient has had a right mastectomy. There are no findings suspicious for malignancy. IMPRESSION: No mammographic evidence of malignancy. A result letter of this screening mammogram will be mailed directly to the patient. RECOMMENDATION: Screening mammogram in one year.  (Code:SM-L-61M) BI-RADS CATEGORY  1: Negative. Electronically Signed   By: Kristopher Oppenheim M.D.   On: 05/02/2021 09:27    Assessment and plan- Patient is a 71 y.o. female with history of stage IIIb right breast cancer s/p adjuvant chemotherapy and radiation treatment.  She is here for routine follow-up  Discussed the results of the bone density scanWhich shows gradually worsening T-scores especially in her left femur neck which she was now -2.1 in July 2022 as compared to -1.7 back in 2020.  She does not have any overt osteoporosis yet.  Given her high risk breast cancer she ideally needs to be on endocrine therapy until 2025.  She will continue on Femara along with calcium and vitamin D and I will see her back in 6 months with vitamin D levels.  Clinically she is doing well with no concerning signs and symptoms of recurrence based on today's exam and her left breast mammogram from this month was also unremarkable   Visit Diagnosis 1. Encounter for follow-up surveillance of breast cancer   2. Encounter for screening mammogram for malignant neoplasm of breast   3. Use of letrozole (Femara)      Dr. Randa Evens, MD, MPH [facility] [phone] 05/07/2021 12:38 PM

## 2021-05-21 ENCOUNTER — Other Ambulatory Visit: Payer: Self-pay

## 2021-05-21 ENCOUNTER — Encounter: Payer: Self-pay | Admitting: Surgery

## 2021-05-21 ENCOUNTER — Ambulatory Visit (INDEPENDENT_AMBULATORY_CARE_PROVIDER_SITE_OTHER): Payer: Medicare Other | Admitting: Surgery

## 2021-05-21 VITALS — BP 151/86 | HR 124 | Temp 99.1°F | Ht 61.0 in | Wt 153.0 lb

## 2021-05-21 DIAGNOSIS — Z853 Personal history of malignant neoplasm of breast: Secondary | ICD-10-CM | POA: Diagnosis not present

## 2021-05-21 NOTE — Patient Instructions (Addendum)
If you have any other concerns, please don't hesitate to contact our office.   We will contact you December 2023 to schedule your mammogram and breast exam for January 2024.

## 2021-05-22 NOTE — Progress Notes (Signed)
Abigail Wheeler is an 71 y.o. female.        Chief Complaint  Patient presents with   Follow-up      1 year mammogram      HPI: Abigail Wheeler is a 71 year old female well-known to our practice with a history of mastectomy on the right side for invasive carcinoma multifocal lobular CA by Dr. Jamal Collin 8 years ago.  ER/PR + Her2 (-). SHe did receive adjuvant XRT. She did receive adjuvant chemotherapy.  She is currently on Femara and Dr. Janese Banks is following her. she is doing very well.  Denies any breast masses.  No nipple discharge.  No fevers no chills no weight loss.  Mammogram personally reviewed showing no evidence of any concerning lesions.      Past Medical History:  Diagnosis Date   Arthritis     Breast cancer ([place]) 2015    chemo and radiation   Cancer ([place]) 05-20-13    T1 N1 ER positive/PR negative HER2 negative. right breast   Diabetes mellitus without complication (HCC)     GERD (gastroesophageal reflux disease)     Hypertension     PE (pulmonary embolism)     Personal history of chemotherapy 2015    Right breast   Personal history of radiation therapy 2015    Right breast   Renal calculi             Past Surgical History:  Procedure Laterality Date   BREAST BIOPSY Right 04-05-13    positive   BREAST SURGERY Right 05/20/13    mastectomy   CESAREAN SECTION   1991   COLONOSCOPY WITH PROPOFOL N/A 12/30/2018    Procedure: COLONOSCOPY WITH PROPOFOL;  Surgeon: Virgel Manifold, MD;  Location: [facility] ENDOSCOPY;  Service: Endoscopy;  Laterality: N/A;   MASTECTOMY Right 2015    chemo and radiation           Family History  Problem Relation Age of Onset   Lung cancer Father     Breast cancer Maternal Grandmother 68      Social History:  reports that she has never smoked. She has never used smokeless tobacco. She reports that she does not drink alcohol and does not use drugs.   Allergies: No Known Allergies   Medications reviewed.       ROS Full ROS performed and is  otherwise negative other than what is stated in HPI     Physical Exam Vitals and nursing note reviewed. Exam conducted with a chaperone present.  Constitutional:      General: She is not in acute distress.    Appearance: Normal appearance. She is normal weight.  Eyes:     General: No scleral icterus.       Right eye: No discharge.        Left eye: No discharge.  Cardiovascular:     Rate and Rhythm: Normal rate and regular rhythm.     Heart sounds: No murmur.  Pulmonary:     Effort: Pulmonary effort is normal. No respiratory distress.     Breath sounds: Normal breath sounds. No stridor.     Comments: BREAST: Right mastectomy scar. No masses No LAD. Excess tissue on right axilla from mastectomy scar. No masses and no infection. Left breast w/o concerning mass. Abdominal:     General: Abdomen is flat. There is no distension.     Palpations: Abdomen is soft. There is no mass.     Tenderness: There is no abdominal  tenderness.     Hernia: No hernia is present.  Musculoskeletal:        General: No swelling or tenderness. Normal range of motion.  Skin:    General: Skin is warm and dry.     Capillary Refill: Capillary refill takes less than 2 seconds.  Neurological:     General: No focal deficit present.     Mental Status: She is alert and oriented to person, place, and time.  Psychiatric:        Mood and Affect: Mood normal.        Behavior: Behavior normal.        Thought Content: Thought content normal.        Judgment: Judgment normal.            Assessment/Plan:   71 year old female with a history of breast cancer no evidence of local recurrence no evidence of new concerning breast pathology.  s. We will see her back in 1 year with physical examination mammogram.  Please note that I spent 30 minutes encounter including personally reviewing imaging studies, coordinating her care, counseling the patient, placing orders and performing appropriate documentation  Caroleen Hamman,  MD [facility] General Surgeon

## 2021-06-21 ENCOUNTER — Other Ambulatory Visit: Payer: Self-pay

## 2021-06-21 MED ORDER — ACCU-CHEK FASTCLIX LANCETS MISC: 3 refills | Status: DC

## 2021-06-28 ENCOUNTER — Other Ambulatory Visit: Payer: Self-pay

## 2021-06-28 ENCOUNTER — Encounter: Payer: Self-pay | Admitting: Physician Assistant

## 2021-06-28 ENCOUNTER — Ambulatory Visit: Payer: Medicare Other | Admitting: Physician Assistant

## 2021-06-28 VITALS — BP 144/88 | HR 81 | Temp 98.7°F | Resp 16 | Ht 61.0 in | Wt 153.6 lb

## 2021-06-28 DIAGNOSIS — C50811 Malignant neoplasm of overlapping sites of right female breast: Secondary | ICD-10-CM | POA: Diagnosis not present

## 2021-06-28 DIAGNOSIS — E1165 Type 2 diabetes mellitus with hyperglycemia: Secondary | ICD-10-CM | POA: Diagnosis not present

## 2021-06-28 DIAGNOSIS — Z170 Estrogen receptor positive status [ER+]: Secondary | ICD-10-CM

## 2021-06-28 DIAGNOSIS — I10 Essential (primary) hypertension: Secondary | ICD-10-CM | POA: Diagnosis not present

## 2021-06-28 LAB — POCT GLYCOSYLATED HEMOGLOBIN (HGB A1C): Hemoglobin A1C: 6.2 % — AB (ref 4.0–5.6)

## 2021-06-28 NOTE — Progress Notes (Cosign Needed)
[facility] °[address] °[place], [place] [address] ° °Internal MEDICINE  °Office Visit Note ° °Patient Name: Abigail Wheeler ° 09/29/1950  °[id number] ° °Date of Service: 06/28/2021 ° °Chief Complaint  °Patient presents with  ° Follow-up  ° Gastroesophageal Reflux  ° Hypertension  ° Diabetes  ° Quality Metric Gaps  °  Foot Exam  ° ° °HPI °Pt is here for routine follow up °-BP at home 128/80, BP is always elevated when she goes to any provider's office °-BG 80-112, taking metformin daily °-sleeping well °-Taking crestor twice per week and tolerating well °-She continues to be followed by Dr. Rao and Dr. Pabon due to history of breast cancer status post right mastectomy and adjuvant chemotherapy.  Continues on Femara.  Reports everything has been going well ° °Current Medication: °Outpatient Encounter Medications as of 06/28/2021  °Medication Sig  ° Accu-Chek FastClix Lancets MISC Use as directed for once a day  ° acetaminophen (TYLENOL) 500 MG tablet Take 500 mg by mouth every 6 (six) hours as needed.  ° calcium carbonate (OS-CAL) 600 MG TABS tablet Take 600 mg by mouth daily.  ° Cholecalciferol (VITAMIN D-3) 1000 UNITS CAPS Take 1 capsule by mouth daily.  ° fluticasone (FLONASE) 50 MCG/ACT nasal spray Place 2 sprays into both nostrils daily.  ° glucose blood (ACCU-CHEK GUIDE) test strip 1 each by Other route daily. Use as instructed to check blood sugar once daily   E11.65  ° letrozole (FEMARA) 2.5 MG tablet Take 1 tablet (2.5 mg total) by mouth daily.  ° losartan (COZAAR) 50 MG tablet TAKE 1 TABLET BY MOUTH EVERY DAY  ° metFORMIN (GLUCOPHAGE-XR) 500 MG 24 hr tablet Take 1 tablet (500 mg total) by mouth daily with breakfast.  ° pyridoxine (B-6) 100 MG tablet Vitamin B-6 100 mg tablet ° Take 1 tablet twice a day by oral route.  ° rosuvastatin (CRESTOR) 5 MG tablet Take one tab twice a week  ° °No facility-administered encounter medications on file as of 06/28/2021.  ° ° °Surgical History: °Past Surgical  History:  °Procedure Laterality Date  ° BREAST BIOPSY Right 04-05-13  ° positive  ° BREAST SURGERY Right 05/20/13  ° mastectomy  ° CESAREAN SECTION  1991  ° COLONOSCOPY WITH PROPOFOL N/A 12/30/2018  ° Procedure: COLONOSCOPY WITH PROPOFOL;  Surgeon: Tahiliani, Varnita B, MD;  Location: [facility] ENDOSCOPY;  Service: Endoscopy;  Laterality: N/A;  ° MASTECTOMY Right 2015  ° chemo and radiation  ° ° °Medical History: °Past Medical History:  °Diagnosis Date  ° Arthritis   ° Breast cancer (HCC) 2015  ° chemo and radiation  ° Cancer (HCC) 05-20-13  ° T1 N1 ER positive/PR negative HER2 negative. right breast  ° Diabetes mellitus without complication (HCC)   ° GERD (gastroesophageal reflux disease)   ° Hypertension   ° PE (pulmonary embolism)   ° Personal history of chemotherapy 2015  ° Right breast  ° Personal history of radiation therapy 2015  ° Right breast  ° Renal calculi   ° ° °Family History: °Family History  °Problem Relation Age of Onset  ° Lung cancer Father   ° Breast cancer Maternal Grandmother 68  ° ° °Social History  ° °Socioeconomic History  ° Marital status: Married  °  Spouse name: Not on file  ° Number of children: Not on file  ° Years of education: Not on file  ° Highest education level: Not on file  °Occupational History  ° Not on file  °Tobacco   Use   Smoking status: Never   Smokeless tobacco: Never  Vaping Use   Vaping Use: Never used  Substance and Sexual Activity   Alcohol use: No   Drug use: No   Sexual activity: Yes  Other Topics Concern   Not on file  Social History Narrative   Not on file   Social Determinants of Health   Financial Resource Strain: Not on file  Food Insecurity: Not on file  Transportation Needs: Not on file  Physical Activity: Not on file  Stress: Not on file  Social Connections: Not on file  Intimate Partner Violence: Not on file      Review of Systems  Constitutional:  Negative for chills, diaphoresis and fatigue.  HENT:  Negative for ear pain, postnasal drip  and sinus pressure.   Eyes:  Negative for photophobia, discharge, redness, itching and visual disturbance.  Respiratory:  Negative for cough, shortness of breath and wheezing.   Cardiovascular:  Negative for chest pain, palpitations and leg swelling.  Gastrointestinal:  Negative for abdominal pain, constipation, diarrhea, nausea and vomiting.  Genitourinary:  Negative for dysuria and flank pain.  Musculoskeletal:  Negative for arthralgias, back pain, gait problem and neck pain.  Skin:  Negative for color change.  Allergic/Immunologic: Negative for environmental allergies and food allergies.  Neurological:  Negative for dizziness and headaches.  Hematological:  Does not bruise/bleed easily.  Psychiatric/Behavioral:  Negative for agitation, behavioral problems (depression) and hallucinations.    Vital Signs: BP (!) 144/88 Comment: 149/82   Pulse 81    Temp 98.7 F (37.1 C)    Resp 16    Ht 5' 1" (1.549 m)    Wt 153 lb 9.6 oz (69.7 kg)    SpO2 99%    BMI 29.02 kg/m    Physical Exam Vitals and nursing note reviewed.  Constitutional:      General: She is not in acute distress.    Appearance: She is well-developed. She is not diaphoretic.  HENT:     Head: Normocephalic and atraumatic.     Right Ear: External ear normal.     Left Ear: External ear normal.     Nose: Nose normal.     Mouth/Throat:     Pharynx: No oropharyngeal exudate.  Eyes:     Conjunctiva/sclera: Conjunctivae normal.     Pupils: Pupils are equal, round, and reactive to light.  Neck:     Thyroid: No thyromegaly.     Vascular: No JVD.     Trachea: No tracheal deviation.  Cardiovascular:     Rate and Rhythm: Normal rate and regular rhythm.     Heart sounds: Normal heart sounds. No murmur heard.   No friction rub. No gallop.  Pulmonary:     Effort: Pulmonary effort is normal. No respiratory distress.     Breath sounds: Normal breath sounds. No stridor. No wheezing or rales.  Chest:     Chest wall: No tenderness.   Abdominal:     General: Bowel sounds are normal.     Palpations: Abdomen is soft.     Tenderness: There is no abdominal tenderness.  Musculoskeletal:        General: No tenderness or deformity. Normal range of motion.     Cervical back: Normal range of motion and neck supple.  Lymphadenopathy:     Cervical: No cervical adenopathy.  Skin:    General: Skin is warm and dry.     Coloration: Skin is not pale.  Findings: No erythema or rash.  Neurological:     Mental Status: She is alert.     Cranial Nerves: No cranial nerve deficit.     Motor: No abnormal muscle tone.     Coordination: Coordination normal.     Deep Tendon Reflexes: Reflexes are normal and symmetric.  Psychiatric:        Behavior: Behavior normal.        Thought Content: Thought content normal.        Judgment: Judgment normal.       Assessment/Plan: 1. Type 2 diabetes mellitus with hyperglycemia, without long-term current use of insulin (HCC) - POCT HgB A1C 6.2 which is slightly increased from 6.1 on last check.  Continue metformin as before and work on diet and exercise  2. Benign hypertension Patient has whitecoat syndrome and has elevated blood pressure readings in office however is always well controlled at home.  Continue with current medications  3. Malignant neoplasm of overlapping sites of right breast in female, estrogen receptor positive ([place]) Followed by oncology    General Counseling: Tifani verbalizes understanding of the findings of todays visit and agrees with plan of treatment. I have discussed any further diagnostic evaluation that may be needed or ordered today. We also reviewed her medications today. she has been encouraged to call the office with any questions or concerns that should arise related to todays visit.    Orders Placed This Encounter  Procedures   POCT HgB A1C    No orders of the defined types were placed in this encounter.   This patient was seen by Drema Dallas, PA-C in collaboration with Dr. Clayborn Bigness as a part of collaborative care agreement.   Total time spent:30 Minutes Time spent includes review of chart, medications, test results, and follow up plan with the patient.      Dr Lavera Guise Internal medicine

## 2021-11-01 ENCOUNTER — Ambulatory Visit: Payer: Medicare Other | Admitting: Physician Assistant

## 2021-11-05 ENCOUNTER — Encounter: Payer: Self-pay | Admitting: Medical Oncology

## 2021-11-05 ENCOUNTER — Inpatient Hospital Stay: Payer: Medicare Other | Attending: Oncology

## 2021-11-05 ENCOUNTER — Inpatient Hospital Stay (HOSPITAL_BASED_OUTPATIENT_CLINIC_OR_DEPARTMENT_OTHER): Payer: Medicare Other | Admitting: Medical Oncology

## 2021-11-05 VITALS — BP 146/68 | HR 84 | Temp 98.7°F | Resp 20 | Wt 150.2 lb

## 2021-11-05 DIAGNOSIS — Z86711 Personal history of pulmonary embolism: Secondary | ICD-10-CM | POA: Diagnosis not present

## 2021-11-05 DIAGNOSIS — Z7901 Long term (current) use of anticoagulants: Secondary | ICD-10-CM | POA: Insufficient documentation

## 2021-11-05 DIAGNOSIS — K219 Gastro-esophageal reflux disease without esophagitis: Secondary | ICD-10-CM | POA: Insufficient documentation

## 2021-11-05 DIAGNOSIS — M8580 Other specified disorders of bone density and structure, unspecified site: Secondary | ICD-10-CM | POA: Diagnosis not present

## 2021-11-05 DIAGNOSIS — I10 Essential (primary) hypertension: Secondary | ICD-10-CM | POA: Diagnosis not present

## 2021-11-05 DIAGNOSIS — Z79811 Long term (current) use of aromatase inhibitors: Secondary | ICD-10-CM | POA: Diagnosis not present

## 2021-11-05 DIAGNOSIS — Z08 Encounter for follow-up examination after completed treatment for malignant neoplasm: Secondary | ICD-10-CM

## 2021-11-05 DIAGNOSIS — Z923 Personal history of irradiation: Secondary | ICD-10-CM | POA: Insufficient documentation

## 2021-11-05 DIAGNOSIS — C50912 Malignant neoplasm of unspecified site of left female breast: Secondary | ICD-10-CM | POA: Insufficient documentation

## 2021-11-05 DIAGNOSIS — Z1231 Encounter for screening mammogram for malignant neoplasm of breast: Secondary | ICD-10-CM

## 2021-11-05 DIAGNOSIS — Z9221 Personal history of antineoplastic chemotherapy: Secondary | ICD-10-CM | POA: Diagnosis not present

## 2021-11-05 DIAGNOSIS — E119 Type 2 diabetes mellitus without complications: Secondary | ICD-10-CM | POA: Insufficient documentation

## 2021-11-05 DIAGNOSIS — Z7984 Long term (current) use of oral hypoglycemic drugs: Secondary | ICD-10-CM | POA: Diagnosis not present

## 2021-11-05 DIAGNOSIS — Z170 Estrogen receptor positive status [ER+]: Secondary | ICD-10-CM | POA: Diagnosis not present

## 2021-11-05 DIAGNOSIS — Z79899 Other long term (current) drug therapy: Secondary | ICD-10-CM | POA: Diagnosis not present

## 2021-11-05 DIAGNOSIS — Z853 Personal history of malignant neoplasm of breast: Secondary | ICD-10-CM | POA: Diagnosis not present

## 2021-11-05 LAB — VITAMIN D 25 HYDROXY (VIT D DEFICIENCY, FRACTURES): Vit D, 25-Hydroxy: 35.7 ng/mL (ref 30–100)

## 2021-11-05 NOTE — Progress Notes (Signed)
   Hematology/Oncology Consult note [facility]  [phone] [phone]  Patient Care Team: McDonough, Lauren K, PA-C as PCP - General (Physician Assistant) Sankar, Seeplaputhur G, MD (General Surgery) Khan, Fozia M, MD (Internal Medicine) Corcoran, Melissa C, MD (Inactive) as Referring Physician (Hematology and Oncology)   Name of the patient: Abigail Wheeler  [id number]  05/05/1950   Date of visit: 11/05/21  Diagnosis- history of stage IIIb right breast cancer currently on Femara  Chief complaint/ Reason for visit-routine follow-up of breast cancer  Heme/Onc history:  Patient is a 71-year-old female who sees Dr. Corcoran for her history of stage IIIb right breast cancer which was diagnosed in January 2015.  She is status post right mastectomy and sentinel lymph node biopsy.  Pathology showed grade 1 lobular carcinoma multifocal, largest focus was 1.7 cm invading the pectoralis muscle additional foci were 1.4 cm and 1 mm respectively.  Sentinel lymph node was also positive with extracapsular extension.  Tumor was ER PR positive and HER-2/neu negative.  Patient received dose dense AC followed by 12 weeks of Taxol.  She also received adjuvant radiation to her chest and axilla.  She then started taking Femara.  Patient also diagnosed with multiple left lower lobe pulmonary emboli in April 2015.  Hypercoagulable work-up was negative and patient is on Xarelto.  Bone density scan in May 2018 showed osteopenia with a score of -1.9.  Patient was not a candidate for FRAX score due to Femara.    Interval history- Patient reports that she is doing well. Eating/drinking well. Down 3 pounds in 6 months but thinks this is due to summer. Tolerating her Femara well. Taking her calcium and vitamin D as prescribed. No new fractures. Last mammogram was on 05/13/2021 -negative. Bone density in July 2022 showed worsening T scores.   ECOG PS- 1 Pain scale-  0   Review of systems- Review of Systems  Constitutional:  Negative for chills, fever, malaise/fatigue and weight loss.  HENT:  Negative for congestion, ear discharge and nosebleeds.   Eyes:  Negative for blurred vision.  Respiratory:  Negative for cough, hemoptysis, sputum production, shortness of breath and wheezing.   Cardiovascular:  Negative for chest pain, palpitations, orthopnea and claudication.  Gastrointestinal:  Negative for abdominal pain, blood in stool, constipation, diarrhea, heartburn, melena, nausea and vomiting.  Genitourinary:  Negative for dysuria, flank pain, frequency, hematuria and urgency.  Musculoskeletal:  Negative for back pain, joint pain and myalgias.  Skin:  Negative for rash.  Neurological:  Negative for dizziness, tingling, focal weakness, seizures, weakness and headaches.  Endo/Heme/Allergies:  Does not bruise/bleed easily.  Psychiatric/Behavioral:  Negative for depression and suicidal ideas. The patient does not have insomnia.       No Known Allergies   Past Medical History:  Diagnosis Date   Arthritis    Breast cancer (HCC) 2015   chemo and radiation   Cancer (HCC) 05-20-13   T1 N1 ER positive/PR negative HER2 negative. right breast   Diabetes mellitus without complication (HCC)    GERD (gastroesophageal reflux disease)    Hypertension    PE (pulmonary embolism)    Personal history of chemotherapy 2015   Right breast   Personal history of radiation therapy 2015   Right breast   Renal calculi      Past Surgical History:  Procedure Laterality Date   BREAST BIOPSY Right 04-05-13   positive   BREAST SURGERY Right 05/20/13   mastectomy   CESAREAN SECTION    1991   COLONOSCOPY WITH PROPOFOL N/A 12/30/2018   Procedure: COLONOSCOPY WITH PROPOFOL;  Surgeon: Tahiliani, Varnita B, MD;  Location: [facility] ENDOSCOPY;  Service: Endoscopy;  Laterality: N/A;   MASTECTOMY Right 2015   chemo and radiation    Social History   Socioeconomic History    Marital status: Married    Spouse name: Not on file   Number of children: Not on file   Years of education: Not on file   Highest education level: Not on file  Occupational History   Not on file  Tobacco Use   Smoking status: Never   Smokeless tobacco: Never  Vaping Use   Vaping Use: Never used  Substance and Sexual Activity   Alcohol use: No   Drug use: No   Sexual activity: Yes  Other Topics Concern   Not on file  Social History Narrative   Not on file   Social Determinants of Health   Financial Resource Strain: Not on file  Food Insecurity: Not on file  Transportation Needs: Not on file  Physical Activity: Not on file  Stress: Not on file  Social Connections: Not on file  Intimate Partner Violence: Not on file    Family History  Problem Relation Age of Onset   Lung cancer Father    Breast cancer Maternal Grandmother 68     Current Outpatient Medications:    Accu-Chek FastClix Lancets MISC, Use as directed for once a day, Disp: 100 each, Rfl: 3   acetaminophen (TYLENOL) 500 MG tablet, Take 500 mg by mouth every 6 (six) hours as needed., Disp: , Rfl:    calcium carbonate (OS-CAL) 600 MG TABS tablet, Take 600 mg by mouth daily., Disp: , Rfl:    Cholecalciferol (VITAMIN D-3) 1000 UNITS CAPS, Take 1 capsule by mouth daily., Disp: , Rfl:    fluticasone (FLONASE) 50 MCG/ACT nasal spray, Place 2 sprays into both nostrils daily., Disp: 16 mL, Rfl: 0   glucose blood (ACCU-CHEK GUIDE) test strip, 1 each by Other route daily. Use as instructed to check blood sugar once daily   E11.65, Disp: 100 strip, Rfl: 5   letrozole (FEMARA) 2.5 MG tablet, Take 1 tablet (2.5 mg total) by mouth daily., Disp: 90 tablet, Rfl: 4   losartan (COZAAR) 50 MG tablet, TAKE 1 TABLET BY MOUTH EVERY DAY, Disp: 90 tablet, Rfl: 3   metFORMIN (GLUCOPHAGE-XR) 500 MG 24 hr tablet, Take 1 tablet (500 mg total) by mouth daily with breakfast., Disp: 90 tablet, Rfl: 3   pyridoxine (B-6) 100 MG tablet, Vitamin  B-6 100 mg tablet  Take 1 tablet twice a day by oral route., Disp: , Rfl:    rosuvastatin (CRESTOR) 5 MG tablet, Take one tab twice a week, Disp: 24 tablet, Rfl: 3  Physical exam:  Vitals:   11/05/21 1109  BP: (!) 146/68  Pulse: 84  Resp: 20  Temp: 98.7 F (37.1 C)  SpO2: 100%  Weight: 150 lb 3.2 oz (68.1 kg)   Physical Exam Cardiovascular:     Rate and Rhythm: Normal rate and regular rhythm.     Heart sounds: Normal heart sounds.  Pulmonary:     Effort: Pulmonary effort is normal.     Breath sounds: Normal breath sounds.  Abdominal:     General: Bowel sounds are normal.     Palpations: Abdomen is soft.  Skin:    General: Skin is warm and dry.  Neurological:     Mental Status: She is alert and oriented   to person, place, and time.     Declined breast exam       Latest Ref Rng & Units 11/03/2018   11:04 AM  CMP  Glucose 70 - 99 mg/dL 141   BUN 8 - 23 mg/dL 14   Creatinine 0.44 - 1.00 mg/dL 0.99   Sodium 135 - 145 mmol/L 143   Potassium 3.5 - 5.1 mmol/L 3.5   Chloride 98 - 111 mmol/L 107   CO2 22 - 32 mmol/L 27   Calcium 8.9 - 10.3 mg/dL 10.0   Total Protein 6.5 - 8.1 g/dL 7.9   Total Bilirubin 0.3 - 1.2 mg/dL 0.6   Alkaline Phos 38 - 126 U/L 54   AST 15 - 41 U/L 26   ALT 0 - 44 U/L 32       Latest Ref Rng & Units 11/03/2018   11:04 AM  CBC  WBC 4.0 - 10.5 K/uL 4.4   Hemoglobin 12.0 - 15.0 g/dL 13.2   Hematocrit 36.0 - 46.0 % 38.8   Platelets 150 - 400 K/uL 216     No images are attached to the encounter.  No results found.   Assessment and plan- Patient is a 71 y.o. female with history of stage IIIb right breast cancer s/p adjuvant chemotherapy and radiation treatment.  She is here for routine follow-up   Chronic. Tolerating medication well. She is considered high risk breast cancer -endocrine therapy until 2025.  Continue Femara along with calcium and vitamin D. 6 month follow up with Dr. Rao with vitamin D, BMP. Mammogram in Jan, 2024. DEXA July  2024.    Visit Diagnosis 1. Use of letrozole (Femara)   2. Encounter for screening mammogram for malignant neoplasm of breast   3. Encounter for follow-up surveillance of breast cancer      Sarah Covington PA-C [facility] 11/05/2021 11:44 AM                

## 2021-12-27 ENCOUNTER — Other Ambulatory Visit: Payer: Self-pay | Admitting: Internal Medicine

## 2022-01-16 DIAGNOSIS — H40003 Preglaucoma, unspecified, bilateral: Secondary | ICD-10-CM | POA: Diagnosis not present

## 2022-01-18 DIAGNOSIS — H40003 Preglaucoma, unspecified, bilateral: Secondary | ICD-10-CM | POA: Diagnosis not present

## 2022-01-28 ENCOUNTER — Other Ambulatory Visit: Payer: Self-pay | Admitting: Internal Medicine

## 2022-01-30 ENCOUNTER — Other Ambulatory Visit: Payer: Self-pay

## 2022-01-30 MED ORDER — ACCU-CHEK GUIDE VI STRP: ORAL_STRIP | 2 refills | Status: DC

## 2022-02-18 ENCOUNTER — Ambulatory Visit: Payer: Medicare Other | Admitting: Physician Assistant

## 2022-02-20 ENCOUNTER — Ambulatory Visit (LOCAL_COMMUNITY_HEALTH_CENTER): Payer: Medicare Other

## 2022-02-20 ENCOUNTER — Encounter: Payer: Self-pay | Admitting: Hematology and Oncology

## 2022-02-20 DIAGNOSIS — Z23 Encounter for immunization: Secondary | ICD-10-CM

## 2022-02-20 DIAGNOSIS — Z719 Counseling, unspecified: Secondary | ICD-10-CM

## 2022-02-20 NOTE — Progress Notes (Signed)
  Are you feeling sick today? No   Have you ever received a dose of COVID-19 Vaccine? [employer], [place], [place], [place], Other) Yes  If yes, which vaccine and how many doses?   PFIZER, 4   Did you bring the vaccination record card or other documentation?  Yes   Do you have a health condition or are undergoing treatment that makes you moderately or severely immunocompromised? This would include, but not be limited to: cancer, HIV, organ transplant, immunosuppressive therapy/high-dose corticosteroids, or moderate/severe primary immunodeficiency.  No  Have you received COVID-19 vaccine before or during hematopoietic cell transplant (HCT) or CAR-T-cell therapies? No  Have you ever had an allergic reaction to: (This would include a severe allergic reaction or a reaction that caused hives, swelling, or respiratory distress, including wheezing.) A component of a COVID-19 vaccine or a previous dose of COVID-19 vaccine? No   Have you ever had an allergic reaction to another vaccine (other thanCOVID-19 vaccine) or an injectable medication? (This would include a severe allergic reaction or a reaction that caused hives, swelling, or respiratory distress, including wheezing.)   No    Do you have a history of any of the following:  Myocarditis or Pericarditis No  Dermal fillers:  No  Multisystem Inflammatory Syndrome (MIS-C or MIS-A)? No  COVID-19 disease within the past 3 months? No  Vaccinated with monkeypox vaccine in the last 4 weeks? No  Eligible, administered covid vaccine, monitored, tolerated well. Provided VIS and NCIR copy. M.Salim Forero, LPN.

## 2022-02-22 ENCOUNTER — Telehealth: Payer: Self-pay | Admitting: Physician Assistant

## 2022-02-22 NOTE — Telephone Encounter (Signed)
Left vm to confirm 02/28/22 appointment-Toni

## 2022-02-28 ENCOUNTER — Ambulatory Visit: Payer: Medicare Other | Admitting: Physician Assistant

## 2022-03-05 ENCOUNTER — Other Ambulatory Visit: Payer: Self-pay | Admitting: Internal Medicine

## 2022-03-07 ENCOUNTER — Other Ambulatory Visit: Payer: Self-pay | Admitting: Internal Medicine

## 2022-03-07 ENCOUNTER — Other Ambulatory Visit: Payer: Self-pay | Admitting: *Deleted

## 2022-03-07 MED ORDER — LETROZOLE 2.5 MG PO TABS: 2.5000 mg | ORAL_TABLET | Freq: Every day | ORAL | 3 refills | Status: DC

## 2022-03-18 ENCOUNTER — Other Ambulatory Visit: Payer: Self-pay

## 2022-03-18 DIAGNOSIS — E1165 Type 2 diabetes mellitus with hyperglycemia: Secondary | ICD-10-CM

## 2022-03-18 MED ORDER — METFORMIN HCL ER 500 MG PO TB24: 500.0000 mg | ORAL_TABLET | Freq: Every day | ORAL | 3 refills | Status: DC

## 2022-03-27 ENCOUNTER — Other Ambulatory Visit: Payer: Self-pay

## 2022-03-27 DIAGNOSIS — Z1231 Encounter for screening mammogram for malignant neoplasm of breast: Secondary | ICD-10-CM

## 2022-03-28 ENCOUNTER — Ambulatory Visit (LOCAL_COMMUNITY_HEALTH_CENTER): Payer: Medicare Other

## 2022-03-28 DIAGNOSIS — Z23 Encounter for immunization: Secondary | ICD-10-CM | POA: Diagnosis not present

## 2022-03-28 DIAGNOSIS — Z719 Counseling, unspecified: Secondary | ICD-10-CM

## 2022-03-29 ENCOUNTER — Encounter: Payer: Self-pay | Admitting: Physician Assistant

## 2022-03-29 ENCOUNTER — Ambulatory Visit (INDEPENDENT_AMBULATORY_CARE_PROVIDER_SITE_OTHER): Payer: Medicare Other | Admitting: Physician Assistant

## 2022-03-29 VITALS — BP 144/86 | HR 93 | Temp 98.5°F | Resp 16 | Ht 61.0 in | Wt 149.8 lb

## 2022-03-29 DIAGNOSIS — Z0001 Encounter for general adult medical examination with abnormal findings: Secondary | ICD-10-CM

## 2022-03-29 DIAGNOSIS — R300 Dysuria: Secondary | ICD-10-CM | POA: Diagnosis not present

## 2022-03-29 DIAGNOSIS — E1165 Type 2 diabetes mellitus with hyperglycemia: Secondary | ICD-10-CM

## 2022-03-29 DIAGNOSIS — R5383 Other fatigue: Secondary | ICD-10-CM

## 2022-03-29 DIAGNOSIS — E782 Mixed hyperlipidemia: Secondary | ICD-10-CM | POA: Diagnosis not present

## 2022-03-29 DIAGNOSIS — I10 Essential (primary) hypertension: Secondary | ICD-10-CM | POA: Diagnosis not present

## 2022-03-29 LAB — POCT GLYCOSYLATED HEMOGLOBIN (HGB A1C): Hemoglobin A1C: 6.1 % — AB (ref 4.0–5.6)

## 2022-03-29 NOTE — Progress Notes (Signed)
[facility] [place], [place] [address]  Internal MEDICINE  Office Visit Note  Patient Name: Abigail Wheeler  [id number]  [id number]  Date of Service: 03/29/2022  Chief Complaint  Patient presents with   Medicare Wellness   Diabetes   Gastroesophageal Reflux   Hypertension   Quality Metric Gaps    TDAP, Shingles and Pneumonia     HPI Pt is here for routine health maintenance examination -BG at home 110s usually -BP at home 932T systolic, always elevated when she goes to the doctor -Due for shingles and PNA vaccine, thinks tdap less than 10 years ago. Flu shot done yesterday -Foot exam done -Sleeping well, good appetite -Continues to be followed by oncology and [facility] for breast cancer  Current Medication: Outpatient Encounter Medications as of 03/29/2022  Medication Sig   Accu-Chek FastClix Lancets MISC Use as directed for once a day   acetaminophen (TYLENOL) 500 MG tablet Take 500 mg by mouth every 6 (six) hours as needed.   calcium carbonate (OS-CAL) 600 MG TABS tablet Take 600 mg by mouth daily.   Cholecalciferol (VITAMIN D-3) 1000 UNITS CAPS Take 1 capsule by mouth daily.   fluticasone (FLONASE) 50 MCG/ACT nasal spray Place 2 sprays into both nostrils daily.   glucose blood (ACCU-CHEK GUIDE) test strip 1 EACH BY OTHER ROUTE DAILY. USE AS INSTRUCTED TO CHECK BLOOD SUGAR ONCE DAILY DxE11.65   letrozole (FEMARA) 2.5 MG tablet TAKE 1 TABLET BY MOUTH DAILY   letrozole (FEMARA) 2.5 MG tablet Take 1 tablet (2.5 mg total) by mouth daily.   losartan (COZAAR) 50 MG tablet TAKE 1 TABLET BY MOUTH EVERY DAY   metFORMIN (GLUCOPHAGE-XR) 500 MG 24 hr tablet Take 1 tablet (500 mg total) by mouth daily with breakfast.   pyridoxine (B-6) 100 MG tablet Vitamin B-6 100 mg tablet  Take 1 tablet twice a day by oral route.   rosuvastatin (CRESTOR) 5 MG tablet TAKE 1 TABLET BY MOUTH TWICE A WEEK   No facility-administered encounter medications on file as of 03/29/2022.     Surgical History: Past Surgical History:  Procedure Laterality Date   BREAST BIOPSY Right 04-05-13   positive   BREAST SURGERY Right 05/20/13   mastectomy   CESAREAN SECTION  1991   COLONOSCOPY WITH PROPOFOL N/A 12/30/2018   Procedure: COLONOSCOPY WITH PROPOFOL;  Surgeon: Virgel Manifold, MD;  Location: [facility] ENDOSCOPY;  Service: Endoscopy;  Laterality: N/A;   MASTECTOMY Right 2015   chemo and radiation    Medical History: Past Medical History:  Diagnosis Date   Arthritis    Breast cancer ([place]) 2015   chemo and radiation   Cancer ([place]) 05-20-13   T1 N1 ER positive/PR negative HER2 negative. right breast   Diabetes mellitus without complication (HCC)    GERD (gastroesophageal reflux disease)    Hypertension    PE (pulmonary embolism)    Personal history of chemotherapy 2015   Right breast   Personal history of radiation therapy 2015   Right breast   Renal calculi     Family History: Family History  Problem Relation Age of Onset   Lung cancer Father    Breast cancer Maternal Grandmother 11      Review of Systems  Constitutional:  Negative for chills, diaphoresis and fatigue.  HENT:  Negative for ear pain, postnasal drip and sinus pressure.   Eyes:  Negative for photophobia, discharge, redness, itching and visual disturbance.  Respiratory:  Negative for cough, shortness of  breath and wheezing.   Cardiovascular:  Negative for chest pain, palpitations and leg swelling.  Gastrointestinal:  Negative for abdominal pain, constipation, diarrhea, nausea and vomiting.  Genitourinary:  Negative for dysuria and flank pain.  Musculoskeletal:  Negative for arthralgias, back pain, gait problem and neck pain.  Skin:  Negative for color change.  Allergic/Immunologic: Negative for environmental allergies and food allergies.  Neurological:  Negative for dizziness and headaches.  Hematological:  Does not bruise/bleed easily.  Psychiatric/Behavioral:  Negative for agitation,  behavioral problems (depression) and hallucinations.      Vital Signs: BP (!) 144/86 Comment: 147/76  Pulse 93   Temp 98.5 F (36.9 C)   Resp 16   Ht 5' 1" (1.549 m)   Wt 149 lb 12.8 oz (67.9 kg)   SpO2 99%   BMI 28.30 kg/m    Physical Exam Vitals and nursing note reviewed.  Constitutional:      General: She is not in acute distress.    Appearance: She is well-developed. She is not diaphoretic.  HENT:     Head: Normocephalic and atraumatic.     Right Ear: External ear normal.     Left Ear: External ear normal.     Nose: Nose normal.     Mouth/Throat:     Pharynx: No oropharyngeal exudate.  Eyes:     Conjunctiva/sclera: Conjunctivae normal.     Pupils: Pupils are equal, round, and reactive to light.  Neck:     Thyroid: No thyromegaly.     Vascular: No JVD.     Trachea: No tracheal deviation.  Cardiovascular:     Rate and Rhythm: Normal rate and regular rhythm.     Pulses:          Dorsalis pedis pulses are 3+ on the right side and 3+ on the left side.       Posterior tibial pulses are 3+ on the right side and 3+ on the left side.     Heart sounds: Normal heart sounds. No murmur heard.    No friction rub. No gallop.  Pulmonary:     Effort: Pulmonary effort is normal. No respiratory distress.     Breath sounds: Normal breath sounds. No stridor. No wheezing or rales.  Chest:     Chest wall: No tenderness.  Abdominal:     General: Bowel sounds are normal.     Palpations: Abdomen is soft.     Tenderness: There is no abdominal tenderness.  Musculoskeletal:        General: No tenderness or deformity. Normal range of motion.     Cervical back: Normal range of motion and neck supple.     Right foot: Normal range of motion.     Left foot: Normal range of motion. Bunion present.  Feet:     Right foot:     Protective Sensation: 4 sites tested.  4 sites sensed.     Skin integrity: Dry skin present.     Toenail Condition: Right toenails are abnormally thick and long.      Left foot:     Protective Sensation: 4 sites tested.  4 sites sensed.     Skin integrity: Dry skin present.     Toenail Condition: Left toenails are abnormally thick and long.  Lymphadenopathy:     Cervical: No cervical adenopathy.  Skin:    General: Skin is warm and dry.     Coloration: Skin is not pale.     Findings: No erythema or  rash.  Neurological:     Mental Status: She is alert.     Cranial Nerves: No cranial nerve deficit.     Motor: No abnormal muscle tone.     Coordination: Coordination normal.     Deep Tendon Reflexes: Reflexes are normal and symmetric.  Psychiatric:        Behavior: Behavior normal.        Thought Content: Thought content normal.        Judgment: Judgment normal.      LABS: Recent Results (from the past 2160 hour(s))  POCT HgB A1C     Status: Abnormal   Collection Time: 03/29/22  9:57 AM  Result Value Ref Range   Hemoglobin A1C 6.1 (A) 4.0 - 5.6 %   HbA1c POC (<> result, manual entry)     HbA1c, POC (prediabetic range)     HbA1c, POC (controlled diabetic range)          Assessment/Plan: 1. Encounter for general adult medical examination with abnormal findings Cpe performed, due for routine labs (not already ordered by oncology) - TSH + free T4 - Lipid Panel With LDL/HDL Ratio - CBC w/Diff/Platelet  2. Type 2 diabetes mellitus with hyperglycemia, without long-term current use of insulin (HCC) - POCT HgB A1C is 6.1 which is slightly improved from 6.2 last visit. Continue metformin as before - Urine Microalbumin w/creat. ratio  3. Benign hypertension Elevated in office due to white coat syndrome, but always normal at home. Continue current medication and monitoring  4. Mixed hyperlipidemia Continue crestor and will update labs - Lipid Panel With LDL/HDL Ratio  5. Other fatigue - CBC w/Diff/Platelet  6. Dysuria - UA/M w/rflx Culture, Routine   General Counseling: Abigail Wheeler verbalizes understanding of the findings of todays  visit and agrees with plan of treatment. I have discussed any further diagnostic evaluation that may be needed or ordered today. We also reviewed her medications today. she has been encouraged to call the office with any questions or concerns that should arise related to todays visit.    Counseling:    Orders Placed This Encounter  Procedures   UA/M w/rflx Culture, Routine   Urine Microalbumin w/creat. ratio   TSH + free T4   Lipid Panel With LDL/HDL Ratio   CBC w/Diff/Platelet   POCT HgB A1C    No orders of the defined types were placed in this encounter.   This patient was seen by Drema Dallas, PA-C in collaboration with Dr. Clayborn Bigness as a part of collaborative care agreement.  Total time spent:35 Minutes  Time spent includes review of chart, medications, test results, and follow up plan with the patient.     Lavera Guise, MD  Internal Medicine

## 2022-04-03 LAB — UA/M W/RFLX CULTURE, ROUTINE
Bilirubin, UA: NEGATIVE
Glucose, UA: NEGATIVE
Ketones, UA: NEGATIVE
Nitrite, UA: NEGATIVE
Protein,UA: NEGATIVE
RBC, UA: NEGATIVE
Specific Gravity, UA: 1.012 (ref 1.005–1.030)
Urobilinogen, Ur: 0.2 mg/dL (ref 0.2–1.0)
pH, UA: 7 (ref 5.0–7.5)

## 2022-04-03 LAB — URINE CULTURE, REFLEX

## 2022-04-03 LAB — MICROALBUMIN / CREATININE URINE RATIO
Creatinine, Urine: 58.1 mg/dL
Microalb/Creat Ratio: 9 mg/g creat (ref 0–29)
Microalbumin, Urine: 5.2 ug/mL

## 2022-04-03 LAB — MICROSCOPIC EXAMINATION
Bacteria, UA: NONE SEEN
Casts: NONE SEEN /lpf

## 2022-04-05 DIAGNOSIS — R5383 Other fatigue: Secondary | ICD-10-CM | POA: Diagnosis not present

## 2022-04-05 DIAGNOSIS — E782 Mixed hyperlipidemia: Secondary | ICD-10-CM | POA: Diagnosis not present

## 2022-04-05 DIAGNOSIS — Z0001 Encounter for general adult medical examination with abnormal findings: Secondary | ICD-10-CM | POA: Diagnosis not present

## 2022-04-06 LAB — LIPID PANEL WITH LDL/HDL RATIO
Cholesterol, Total: 147 mg/dL (ref 100–199)
HDL: 69 mg/dL (ref >39)
LDL Chol Calc (NIH): 65 mg/dL (ref 0–99)
LDL/HDL Ratio: 0.9 ratio (ref 0.0–3.2)
Triglycerides: 61 mg/dL (ref 0–149)
VLDL Cholesterol Cal: 13 mg/dL (ref 5–40)

## 2022-04-06 LAB — CBC WITH DIFFERENTIAL/PLATELET
Basophils Absolute: 0 10*3/uL (ref 0.0–0.2)
Basos: 1 %
EOS (ABSOLUTE): 0.1 10*3/uL (ref 0.0–0.4)
Eos: 1 %
Hematocrit: 40.6 % (ref 34.0–46.6)
Hemoglobin: 13.4 g/dL (ref 11.1–15.9)
Immature Grans (Abs): 0 10*3/uL (ref 0.0–0.1)
Immature Granulocytes: 0 %
Lymphocytes Absolute: 2.2 10*3/uL (ref 0.7–3.1)
Lymphs: 52 %
MCH: 29.4 pg (ref 26.6–33.0)
MCHC: 33 g/dL (ref 31.5–35.7)
MCV: 89 fL (ref 79–97)
Monocytes Absolute: 0.3 10*3/uL (ref 0.1–0.9)
Monocytes: 8 %
Neutrophils Absolute: 1.6 10*3/uL (ref 1.4–7.0)
Neutrophils: 38 %
Platelets: 237 10*3/uL (ref 150–450)
RBC: 4.56 x10E6/uL (ref 3.77–5.28)
RDW: 12.9 % (ref 11.7–15.4)
WBC: 4.2 10*3/uL (ref 3.4–10.8)

## 2022-04-06 LAB — TSH+FREE T4
Free T4: 1.47 ng/dL (ref 0.82–1.77)
TSH: 1.6 u[IU]/mL (ref 0.450–4.500)

## 2022-04-18 ENCOUNTER — Telehealth: Payer: Self-pay

## 2022-04-18 NOTE — Telephone Encounter (Signed)
Left message for patient regarding normal lab results. 

## 2022-04-18 NOTE — Telephone Encounter (Signed)
-----   Message from Mylinda Latina, PA-C sent at 04/18/2022 12:32 PM EST ----- Please let her know that her labs were normal, cholesterol improved from last year

## 2022-05-01 ENCOUNTER — Other Ambulatory Visit: Payer: Self-pay

## 2022-05-01 MED ORDER — ACCU-CHEK GUIDE VI STRP: ORAL_STRIP | 2 refills | Status: DC

## 2022-05-03 ENCOUNTER — Ambulatory Visit
Admission: RE | Admit: 2022-05-03 | Discharge: 2022-05-03 | Disposition: A | Discharge status: Home / Self Care | Payer: Medicare Other | Source: Ambulatory Visit | Attending: Surgery | Admitting: Surgery

## 2022-05-03 DIAGNOSIS — Z1231 Encounter for screening mammogram for malignant neoplasm of breast: Secondary | ICD-10-CM | POA: Diagnosis not present

## 2022-05-08 ENCOUNTER — Encounter: Payer: Self-pay | Admitting: Medical Oncology

## 2022-05-08 ENCOUNTER — Inpatient Hospital Stay: Payer: Medicare Other | Attending: Medical Oncology

## 2022-05-08 ENCOUNTER — Inpatient Hospital Stay (HOSPITAL_BASED_OUTPATIENT_CLINIC_OR_DEPARTMENT_OTHER): Payer: Medicare Other | Admitting: Medical Oncology

## 2022-05-08 VITALS — BP 135/83 | HR 94 | Temp 98.8°F | Wt 148.0 lb

## 2022-05-08 DIAGNOSIS — M8580 Other specified disorders of bone density and structure, unspecified site: Secondary | ICD-10-CM | POA: Diagnosis not present

## 2022-05-08 DIAGNOSIS — Z79899 Other long term (current) drug therapy: Secondary | ICD-10-CM | POA: Diagnosis not present

## 2022-05-08 DIAGNOSIS — K219 Gastro-esophageal reflux disease without esophagitis: Secondary | ICD-10-CM | POA: Diagnosis not present

## 2022-05-08 DIAGNOSIS — Z801 Family history of malignant neoplasm of trachea, bronchus and lung: Secondary | ICD-10-CM | POA: Insufficient documentation

## 2022-05-08 DIAGNOSIS — Z853 Personal history of malignant neoplasm of breast: Secondary | ICD-10-CM | POA: Diagnosis not present

## 2022-05-08 DIAGNOSIS — Z79811 Long term (current) use of aromatase inhibitors: Secondary | ICD-10-CM

## 2022-05-08 DIAGNOSIS — Z7984 Long term (current) use of oral hypoglycemic drugs: Secondary | ICD-10-CM | POA: Insufficient documentation

## 2022-05-08 DIAGNOSIS — Z9221 Personal history of antineoplastic chemotherapy: Secondary | ICD-10-CM | POA: Insufficient documentation

## 2022-05-08 DIAGNOSIS — Z170 Estrogen receptor positive status [ER+]: Secondary | ICD-10-CM | POA: Insufficient documentation

## 2022-05-08 DIAGNOSIS — C50911 Malignant neoplasm of unspecified site of right female breast: Secondary | ICD-10-CM

## 2022-05-08 DIAGNOSIS — Z1231 Encounter for screening mammogram for malignant neoplasm of breast: Secondary | ICD-10-CM | POA: Diagnosis not present

## 2022-05-08 DIAGNOSIS — Z923 Personal history of irradiation: Secondary | ICD-10-CM | POA: Diagnosis not present

## 2022-05-08 DIAGNOSIS — Z86711 Personal history of pulmonary embolism: Secondary | ICD-10-CM | POA: Insufficient documentation

## 2022-05-08 DIAGNOSIS — Z803 Family history of malignant neoplasm of breast: Secondary | ICD-10-CM | POA: Insufficient documentation

## 2022-05-08 DIAGNOSIS — E119 Type 2 diabetes mellitus without complications: Secondary | ICD-10-CM | POA: Insufficient documentation

## 2022-05-08 DIAGNOSIS — M85852 Other specified disorders of bone density and structure, left thigh: Secondary | ICD-10-CM | POA: Diagnosis not present

## 2022-05-08 DIAGNOSIS — I10 Essential (primary) hypertension: Secondary | ICD-10-CM | POA: Insufficient documentation

## 2022-05-08 DIAGNOSIS — Z08 Encounter for follow-up examination after completed treatment for malignant neoplasm: Secondary | ICD-10-CM

## 2022-05-08 LAB — BASIC METABOLIC PANEL
Anion gap: 9 (ref 5–15)
BUN: 14 mg/dL (ref 8–23)
CO2: 23 mmol/L (ref 22–32)
Calcium: 9.8 mg/dL (ref 8.9–10.3)
Chloride: 106 mmol/L (ref 98–111)
Creatinine, Ser: 0.97 mg/dL (ref 0.44–1.00)
GFR, Estimated: >60 mL/min (ref >60)
Glucose, Bld: 124 mg/dL — ABNORMAL HIGH (ref 70–99)
Potassium: 3.6 mmol/L (ref 3.5–5.1)
Sodium: 138 mmol/L (ref 135–145)

## 2022-05-08 LAB — VITAMIN D 25 HYDROXY (VIT D DEFICIENCY, FRACTURES): Vit D, 25-Hydroxy: 37.47 ng/mL (ref 30–100)

## 2022-05-08 NOTE — Addendum Note (Signed)
Addended by: Carl Best H on: 05/08/2022 12:14 PM   Modules accepted: Orders

## 2022-05-08 NOTE — Progress Notes (Signed)
Hematology/Oncology Consult note [facility]  [phone] [phone]  Patient Care Team: Carolynne Edouard as PCP - General (Physician Assistant) Christene Lye, MD (General Surgery) Lavera Guise, MD (Internal Medicine) Lequita Asal, MD (Inactive) as Referring Physician (Hematology and Oncology) Vallery Sa as Physician Assistant (Emergency Medicine)   Name of the patient: Abigail Wheeler  [id number]  [birth year]   Date of visit: 05/08/22  Diagnosis- history of stage IIIb right breast cancer currently on Femara  Chief complaint/ Reason for visit-routine follow-up of breast cancer  Heme/Onc history:  Patient is a 72 year old female who sees Dr. Mike Gip for her history of stage IIIb right breast cancer which was diagnosed in January 2015.  She is status post right mastectomy and sentinel lymph node biopsy.  Pathology showed grade 1 lobular carcinoma multifocal, largest focus was 1.7 cm invading the pectoralis muscle additional foci were 1.4 cm and 1 mm respectively.  Sentinel lymph node was also positive with extracapsular extension.  Tumor was ER PR positive and HER-2/neu negative.  Patient received dose dense AC followed by 12 weeks of Taxol.  She also received adjuvant radiation to her chest and axilla.  She then started taking Femara.  Patient also diagnosed with multiple left lower lobe pulmonary emboli in April 2015.  Hypercoagulable work-up was negative and patient is on Xarelto.  Bone density scan in May 2018 showed osteopenia with a score of -1.9.  Patient was not a candidate for FRAX score due to Femara.    Interval history-  She reports that she is doing really well. She is trying to stay busy as much as she can. She is eating/drinking well. She denies any troubles with the Femara. She denies any new fractures, breast changes, unintentional weight loss, night sweats, vaginal concerns.    Wt  Readings from Last 3 Encounters:  05/08/22 148 lb (67.1 kg)  03/29/22 149 lb 12.8 oz (67.9 kg)  11/05/21 150 lb 3.2 oz (68.1 kg)     ECOG PS- 1 Pain scale- 0   Review of systems- Review of Systems  Constitutional:  Negative for chills, fever, malaise/fatigue and weight loss.  HENT:  Negative for congestion, ear discharge and nosebleeds.   Eyes:  Negative for blurred vision.  Respiratory:  Negative for cough, hemoptysis, sputum production, shortness of breath and wheezing.   Cardiovascular:  Negative for chest pain, palpitations, orthopnea and claudication.  Gastrointestinal:  Negative for abdominal pain, blood in stool, constipation, diarrhea, heartburn, melena, nausea and vomiting.  Genitourinary:  Negative for dysuria, flank pain, frequency, hematuria and urgency.  Musculoskeletal:  Negative for back pain, joint pain and myalgias.  Skin:  Negative for rash.  Neurological:  Negative for dizziness, tingling, focal weakness, seizures, weakness and headaches.  Endo/Heme/Allergies:  Does not bruise/bleed easily.  Psychiatric/Behavioral:  Negative for depression and suicidal ideas. The patient does not have insomnia.       No Known Allergies   Past Medical History:  Diagnosis Date   Arthritis    Breast cancer ([place]) 2015   chemo and radiation   Cancer ([place]) 05-20-13   T1 N1 ER positive/PR negative HER2 negative. right breast   Diabetes mellitus without complication (HCC)    GERD (gastroesophageal reflux disease)    Hypertension    PE (pulmonary embolism)    Personal history of chemotherapy 2015   Right breast   Personal history of radiation therapy 2015   Right breast   Renal  calculi      Past Surgical History:  Procedure Laterality Date   BREAST BIOPSY Right 04-05-13   positive   BREAST SURGERY Right 05/20/13   mastectomy   CESAREAN SECTION  1991   COLONOSCOPY WITH PROPOFOL N/A 12/30/2018   Procedure: COLONOSCOPY WITH PROPOFOL;  Surgeon: Virgel Manifold, MD;   Location: [facility] ENDOSCOPY;  Service: Endoscopy;  Laterality: N/A;   MASTECTOMY Right 2015   chemo and radiation    Social History   Socioeconomic History   Marital status: Married    Spouse name: Not on file   Number of children: Not on file   Years of education: Not on file   Highest education level: Not on file  Occupational History   Not on file  Tobacco Use   Smoking status: Never   Smokeless tobacco: Never  Vaping Use   Vaping Use: Never used  Substance and Sexual Activity   Alcohol use: No   Drug use: No   Sexual activity: Yes  Other Topics Concern   Not on file  Social History Narrative   Not on file   Social Determinants of Health   Financial Resource Strain: Not on file  Food Insecurity: Not on file  Transportation Needs: Not on file  Physical Activity: Not on file  Stress: Not on file  Social Connections: Not on file  Intimate Partner Violence: Not on file    Family History  Problem Relation Age of Onset   Lung cancer Father    Breast cancer Maternal Grandmother 68     Current Outpatient Medications:    Accu-Chek FastClix Lancets MISC, Use as directed for once a day, Disp: 100 each, Rfl: 3   acetaminophen (TYLENOL) 500 MG tablet, Take 500 mg by mouth every 6 (six) hours as needed., Disp: , Rfl:    calcium carbonate (OS-CAL) 600 MG TABS tablet, Take 600 mg by mouth daily., Disp: , Rfl:    Cholecalciferol (VITAMIN D-3) 1000 UNITS CAPS, Take 1 capsule by mouth daily., Disp: , Rfl:    fluticasone (FLONASE) 50 MCG/ACT nasal spray, Place 2 sprays into both nostrils daily., Disp: 16 mL, Rfl: 0   glucose blood (ACCU-CHEK GUIDE) test strip, 1 EACH BY OTHER ROUTE DAILY. USE AS INSTRUCTED TO CHECK BLOOD SUGAR ONCE DAILY DxE11.65, Disp: 100 strip, Rfl: 2   letrozole (FEMARA) 2.5 MG tablet, TAKE 1 TABLET BY MOUTH DAILY, Disp: 90 tablet, Rfl: 4   letrozole (FEMARA) 2.5 MG tablet, Take 1 tablet (2.5 mg total) by mouth daily., Disp: 90 tablet, Rfl: 3   losartan  (COZAAR) 50 MG tablet, TAKE 1 TABLET BY MOUTH EVERY DAY, Disp: 90 tablet, Rfl: 3   metFORMIN (GLUCOPHAGE-XR) 500 MG 24 hr tablet, Take 1 tablet (500 mg total) by mouth daily with breakfast., Disp: 90 tablet, Rfl: 3   pyridoxine (B-6) 100 MG tablet, Vitamin B-6 100 mg tablet  Take 1 tablet twice a day by oral route., Disp: , Rfl:    rosuvastatin (CRESTOR) 5 MG tablet, TAKE 1 TABLET BY MOUTH TWICE A WEEK, Disp: 24 tablet, Rfl: 3  Physical exam:  Vitals:   05/08/22 1043  BP: 135/83  Pulse: 94  Temp: 98.8 F (37.1 C)  TempSrc: Tympanic  Weight: 148 lb (67.1 kg)   Physical Exam Cardiovascular:     Rate and Rhythm: Normal rate and regular rhythm.     Heart sounds: Normal heart sounds.  Pulmonary:     Effort: Pulmonary effort is normal.  Breath sounds: Normal breath sounds.  Chest:     Chest wall: No mass, lacerations, deformity, swelling or tenderness.  Breasts:    Right: Absent.     Left: No swelling, bleeding, inverted nipple, mass, nipple discharge or tenderness.  Abdominal:     General: Bowel sounds are normal.     Palpations: Abdomen is soft.  Lymphadenopathy:     Upper Body:     Right upper body: No supraclavicular, axillary or pectoral adenopathy.     Left upper body: No supraclavicular, axillary or pectoral adenopathy.  Skin:    General: Skin is warm and dry.  Neurological:     Mental Status: She is alert and oriented to person, place, and time.         Latest Ref Rng & Units 05/08/2022   10:24 AM  CMP  Glucose 70 - 99 mg/dL 124   BUN 8 - 23 mg/dL 14   Creatinine 0.44 - 1.00 mg/dL 0.97   Sodium 135 - 145 mmol/L 138   Potassium 3.5 - 5.1 mmol/L 3.6   Chloride 98 - 111 mmol/L 106   CO2 22 - 32 mmol/L 23   Calcium 8.9 - 10.3 mg/dL 9.8       Latest Ref Rng & Units 04/05/2022    9:50 AM  CBC  WBC 3.4 - 10.8 x10E3/uL 4.2   Hemoglobin 11.1 - 15.9 g/dL 13.4   Hematocrit 34.0 - 46.6 % 40.6   Platelets 150 - 450 x10E3/uL 237     No images are attached to the  encounter.  MM 3D SCREEN BREAST UNI LEFT  Result Date: 05/06/2022 CLINICAL DATA:  Screening. EXAM: DIGITAL SCREENING UNILATERAL LEFT MAMMOGRAM WITH CAD AND TOMOSYNTHESIS TECHNIQUE: Left screening digital craniocaudal and mediolateral oblique mammograms were obtained. Left screening digital breast tomosynthesis was performed. The images were evaluated with computer-aided detection. COMPARISON:  Previous exam(s). ACR Breast Density Category b: There are scattered areas of fibroglandular density. FINDINGS: The patient has had a right mastectomy. There are no findings suspicious for malignancy. IMPRESSION: No mammographic evidence of malignancy. A result letter of this screening mammogram will be mailed directly to the patient. RECOMMENDATION: Screening mammogram in one year.  (Code:SM-L-1M) BI-RADS CATEGORY  1: Negative. Electronically Signed   By: Nolon Nations M.D.   On: 05/06/2022 13:33     Assessment and plan- Patient is a 72 y.o. female with history of stage IIIb right breast cancer s/p adjuvant chemotherapy and radiation treatment.  She is here for routine follow-up   Glad to hear that she is doing well. Recent mammogram from 05/03/2022 was Bi- RADS category 1. She will be due for her next in 1 year. She is tolerating her Femara medication well. She is considered high risk breast cancer -endocrine therapy recommended until 2025.  Continue Femara along with calcium and vitamin D. Plan for a 6 month follow up with Dr. Janese Banks with her DEXA prior(July 2024).    Visit Diagnosis 1. Encounter for screening mammogram for malignant neoplasm of breast   2. Encounter for follow-up surveillance of breast cancer   3. Use of letrozole (Femara)      Nelwyn Salisbury PA-C [facility] 05/08/2022 11:24 AM

## 2022-05-15 ENCOUNTER — Encounter: Payer: Self-pay | Admitting: Surgery

## 2022-05-15 ENCOUNTER — Other Ambulatory Visit: Payer: Self-pay

## 2022-05-15 ENCOUNTER — Ambulatory Visit (INDEPENDENT_AMBULATORY_CARE_PROVIDER_SITE_OTHER): Payer: Medicare Other | Admitting: Surgery

## 2022-05-15 VITALS — BP 132/79 | HR 91 | Temp 98.5°F | Ht 61.0 in | Wt 150.0 lb

## 2022-05-15 DIAGNOSIS — Z853 Personal history of malignant neoplasm of breast: Secondary | ICD-10-CM | POA: Diagnosis not present

## 2022-05-15 NOTE — Patient Instructions (Addendum)
We will contact you in one year to schedule your mammogram and breast exam with Dr.Pabon.   Please call the office if you have any questions or concerns.    Breast Self-Awareness Breast self-awareness is knowing how your breasts look and feel. You need to: Check your breasts on a regular basis. Tell your doctor about any changes. Become familiar with the look and feel of your breasts. This can help you catch a breast problem while it is still small and can be treated. You should do breast self-exams even if you have breast implants. What you need: A mirror. A well-lit room. A pillow or other soft object. How to do a breast self-exam Follow these steps to do a breast self-exam: Look for changes  Take off all the clothes above your waist. Stand in front of a mirror in a room with good lighting. Put your hands down at your sides. Compare your breasts in the mirror. Look for any difference between them, such as: A difference in shape. A difference in size. Wrinkles, dips, and bumps in one breast and not the other. Look at each breast for changes in the skin, such as: Redness. Scaly areas. Skin that has gotten thicker. Dimpling. Open sores (ulcers). Look for changes in your nipples, such as: Fluid coming out of a nipple. Fluid around a nipple. Bleeding. Dimpling. Redness. A nipple that looks pushed in (retracted), or that has changed position. Feel for changes Lie on your back. Feel each breast. To do this: Pick a breast to feel. Place a pillow under the shoulder closest to that breast. Put the arm closest to that breast behind your head. Feel the nipple area of that breast using the hand of your other arm. Feel the area with the pads of your three middle fingers by making small circles with your fingers. Use light, medium, and firm pressure. Continue the overlapping circles, moving downward over the breast. Keep making circles with your fingers. Stop when you feel your  ribs. Start making circles with your fingers again, this time going upward until you reach your collarbone. Then, make circles outward across your breast and into your armpit area. Squeeze your nipple. Check for discharge and lumps. Repeat these steps to check your other breast. Sit or stand in the tub or shower. With soapy water on your skin, feel each breast the same way you did when you were lying down. Write down what you find Writing down what you find can help you remember what to tell your doctor. Write down: What is normal for each breast. Any changes you find in each breast. These include: The kind of changes you find. A tender or painful breast. Any lump you find. Write down its size and where it is. When you last had your monthly period (menstrual cycle). General tips If you are breastfeeding, the best time to check your breasts is after you feed your baby or after you use a breast pump. If you get monthly bleeding, the best time to check your breasts is 5-7 days after your monthly cycle ends. With time, you will become comfortable with the self-exam. You will also start to know if there are changes in your breasts. Contact a doctor if: You see a change in the shape or size of your breasts or nipples. You see a change in the skin of your breast or nipples, such as red or scaly skin. You have fluid coming from your nipples that is not normal. You find  a new lump or thick area. You have breast pain. You have any concerns about your breast health. Summary Breast self-awareness includes looking for changes in your breasts and feeling for changes within your breasts. You should do breast self-awareness in front of a mirror in a well-lit room. If you get monthly periods (menstrual cycles), the best time to check your breasts is 5-7 days after your period ends. Tell your doctor about any changes you see in your breasts. Changes include changes in size, changes on the skin, painful  or tender breasts, or fluid from your nipples that is not normal. This information is not intended to replace advice given to you by your health care provider. Make sure you discuss any questions you have with your health care provider. Document Revised: 09/13/2021 Document Reviewed: 02/08/2021 Elsevier Patient Education  [place].

## 2022-05-16 ENCOUNTER — Encounter: Payer: Self-pay | Admitting: Surgery

## 2022-05-16 NOTE — Progress Notes (Signed)
Surgical Consultation  05/16/2022  Abigail Wheeler is an 72 y.o. female.   Chief Complaint  Patient presents with   Follow-up    I yr f/u mammo     HPI: Abigail Wheeler is a 72 year old female well-known to our practice with a history of mastectomy on the right side for invasive carcinoma multifocal lobular CA by Dr. Jamal Collin 9 years ago.  ER/PR + Her2 (-). SHe did receive adjuvant XRT. She did receive adjuvant chemotherapy.  She is currently on Femara and Dr. Janese Banks is following her. she is doing very well.  Denies any breast masses.  No nipple discharge.  No fevers no chills no weight loss.  Mammogram personally reviewed showing no evidence of any concerning lesions.   Past Medical History:  Diagnosis Date   Arthritis    Breast cancer ([place]) 2015   chemo and radiation   Cancer ([place]) 05-20-13   T1 N1 ER positive/PR negative HER2 negative. right breast   Diabetes mellitus without complication (HCC)    GERD (gastroesophageal reflux disease)    Hypertension    PE (pulmonary embolism)    Personal history of chemotherapy 2015   Right breast   Personal history of radiation therapy 2015   Right breast   Renal calculi     Past Surgical History:  Procedure Laterality Date   BREAST BIOPSY Right 04-05-13   positive   BREAST SURGERY Right 05/20/13   mastectomy   CESAREAN SECTION  1991   COLONOSCOPY WITH PROPOFOL N/A 12/30/2018   Procedure: COLONOSCOPY WITH PROPOFOL;  Surgeon: Virgel Manifold, MD;  Location: [facility] END[facility]COPY;  Service: Endoscopy;  Laterality: N/A;   MASTECTOMY Right 2015   chemo and radiation    Family History  Problem Relation Age of Onset   Lung cancer Father    Breast cancer Maternal Grandmother 68    Social History:  reports that she has never smoked. She has never used smokeless tobacco. She reports that she does not drink alcohol and does not use drugs.  Allergies: No Known Allergies  Medications reviewed.     ROS Full ROS performed and is otherwise  negative other than what is stated in the HPI    BP 132/79   Pulse 91   Temp 98.5 F (36.9 C) (Oral)   Ht '5\' 1"'$  (1.549 m)   Wt 150 lb (68 kg)   SpO2 99%   BMI 28.34 kg/m   Physical Exam  Vitals and nursing note reviewed. Exam conducted with a chaperone present.  Constitutional:      General: She is not in acute distress.    Appearance: Normal appearance. She is normal weight.  Eyes:     General: No scleral icterus.       Right eye: No discharge.        Left eye: No discharge.  Cardiovascular:     Rate and Rhythm: Normal rate and regular rhythm.     Heart sounds: No murmur.  Pulmonary:     Effort: Pulmonary effort is normal. No respiratory distress.     Breath sounds: Normal breath sounds. No stridor.     Comments: BREAST: Right mastectomy scar. No masses No LAD. Excess tissue on right axilla from mastectomy scar. No masses and no infection. Left breast w/o concerning mass. Abdominal:     General: Abdomen is flat. There is no distension.     Palpations: Abdomen is soft. There is no mass.     Tenderness: There is no abdominal tenderness.  Hernia: No hernia is present.  Musculoskeletal:        General: No swelling or tenderness. Normal range of motion.  Skin:    General: Skin is warm and dry.     Capillary Refill: Capillary refill takes less than 2 seconds.  Neurological:     General: No focal deficit present.     Mental Status: She is alert and oriented to person, place, and time.  Psychiatric:        Mood and Affect: Mood normal.        Behavior: Behavior normal.        Thought Content: Thought content normal.        Judgment: Judgment normal.     No results found for this or any previous visit (from the past 48 hour(s)). No results found.  Assessment/Plan: . 72 year old female with a history of Right  breast cancer no evidence of local recurrence no evidence of new concerning breast pathology. We will see her back in 1 year with physical examination  mammogram . All questions answered and counseling provided  I spent 30 minutes in this encounter including personally reviewing imaging studies, coordination of her care, counseling, reviewing medical records, placing orders and performing appropriate documentation    Caroleen Hamman, MD [facility]

## 2022-05-22 ENCOUNTER — Other Ambulatory Visit: Payer: Self-pay | Admitting: Internal Medicine

## 2022-05-22 DIAGNOSIS — I10 Essential (primary) hypertension: Secondary | ICD-10-CM

## 2022-06-18 ENCOUNTER — Other Ambulatory Visit: Payer: Self-pay

## 2022-06-18 MED ORDER — ACCU-CHEK GUIDE ME W/DEVICE KIT: PACK | 0 refills | Status: DC

## 2022-06-19 ENCOUNTER — Other Ambulatory Visit: Payer: Self-pay

## 2022-06-19 MED ORDER — ACCU-CHEK GUIDE ME W/DEVICE KIT: PACK | 0 refills | Status: DC

## 2022-07-17 DIAGNOSIS — H40003 Preglaucoma, unspecified, bilateral: Secondary | ICD-10-CM | POA: Diagnosis not present

## 2022-07-29 ENCOUNTER — Telehealth: Payer: Self-pay | Admitting: Physician Assistant

## 2022-07-29 NOTE — Telephone Encounter (Signed)
S/w pt today she states that she feels no need to come to scheduled follow up appt to check A1c, pt states she will call back when she feels the need too-nm

## 2022-08-01 ENCOUNTER — Ambulatory Visit: Payer: Medicare Other | Admitting: Physician Assistant

## 2022-08-06 DIAGNOSIS — H2513 Age-related nuclear cataract, bilateral: Secondary | ICD-10-CM | POA: Diagnosis not present

## 2022-08-06 DIAGNOSIS — H40003 Preglaucoma, unspecified, bilateral: Secondary | ICD-10-CM | POA: Diagnosis not present

## 2022-10-04 ENCOUNTER — Other Ambulatory Visit: Payer: Self-pay | Admitting: Internal Medicine

## 2022-10-07 ENCOUNTER — Ambulatory Visit
Admission: RE | Admit: 2022-10-07 | Discharge: 2022-10-07 | Disposition: A | Discharge status: Home / Self Care | Payer: Medicare Other | Source: Ambulatory Visit | Attending: Medical Oncology | Admitting: Medical Oncology

## 2022-10-07 DIAGNOSIS — M85852 Other specified disorders of bone density and structure, left thigh: Secondary | ICD-10-CM | POA: Insufficient documentation

## 2022-10-07 DIAGNOSIS — M8589 Other specified disorders of bone density and structure, multiple sites: Secondary | ICD-10-CM | POA: Diagnosis not present

## 2022-10-07 DIAGNOSIS — Z780 Asymptomatic menopausal state: Secondary | ICD-10-CM | POA: Diagnosis not present

## 2022-11-06 ENCOUNTER — Inpatient Hospital Stay: Payer: Medicare Other | Attending: Oncology | Admitting: Oncology

## 2022-11-06 ENCOUNTER — Encounter: Payer: Self-pay | Admitting: Oncology

## 2022-11-06 VITALS — BP 134/73 | HR 80 | Temp 98.0°F | Resp 18 | Ht 61.0 in | Wt 149.7 lb

## 2022-11-06 DIAGNOSIS — Z08 Encounter for follow-up examination after completed treatment for malignant neoplasm: Secondary | ICD-10-CM

## 2022-11-06 DIAGNOSIS — Z853 Personal history of malignant neoplasm of breast: Secondary | ICD-10-CM | POA: Diagnosis not present

## 2022-11-06 DIAGNOSIS — M85852 Other specified disorders of bone density and structure, left thigh: Secondary | ICD-10-CM | POA: Diagnosis not present

## 2022-11-06 DIAGNOSIS — Z9221 Personal history of antineoplastic chemotherapy: Secondary | ICD-10-CM | POA: Insufficient documentation

## 2022-11-06 DIAGNOSIS — C50911 Malignant neoplasm of unspecified site of right female breast: Secondary | ICD-10-CM | POA: Insufficient documentation

## 2022-11-06 DIAGNOSIS — Z801 Family history of malignant neoplasm of trachea, bronchus and lung: Secondary | ICD-10-CM | POA: Insufficient documentation

## 2022-11-06 DIAGNOSIS — Z170 Estrogen receptor positive status [ER+]: Secondary | ICD-10-CM | POA: Diagnosis not present

## 2022-11-06 DIAGNOSIS — Z79811 Long term (current) use of aromatase inhibitors: Secondary | ICD-10-CM

## 2022-11-06 DIAGNOSIS — Z923 Personal history of irradiation: Secondary | ICD-10-CM | POA: Diagnosis not present

## 2022-11-06 DIAGNOSIS — Z803 Family history of malignant neoplasm of breast: Secondary | ICD-10-CM | POA: Diagnosis not present

## 2022-11-06 MED ORDER — ALENDRONATE SODIUM 70 MG PO TABS: 70.0000 mg | ORAL_TABLET | ORAL | 2 refills | Status: DC

## 2022-11-07 ENCOUNTER — Encounter: Payer: Self-pay | Admitting: Hematology and Oncology

## 2022-11-07 ENCOUNTER — Telehealth: Payer: Self-pay | Admitting: Physician Assistant

## 2022-11-07 ENCOUNTER — Other Ambulatory Visit: Payer: Self-pay | Admitting: Physician Assistant

## 2022-11-07 NOTE — Progress Notes (Signed)
Hematology/Oncology Consult note [facility]  Telephone:([phone] [phone]  Patient Care Team: Alan Ripper as PCP - General (Physician Assistant) Kieth Brightly, MD (General Surgery) Lyndon Code, MD (Internal Medicine) Karen Kitchens as Physician Assistant (Emergency Medicine) Creig Hines, MD as Consulting Physician (Oncology)   Name of the patient: Abigail Wheeler  [id number]  Jun 14, 1950   Date of visit: 11/07/22  Diagnosis- history of stage IIIb right breast cancer currently on Femara   Chief complaint/ Reason for visit-routine follow-up of breast cancer on Femara  Heme/Onc history: Patient is a 72 year old female who sees Dr. Merlene Pulling for her history of stage IIIb right breast cancer which was diagnosed in January 2015.  She is status post right mastectomy and sentinel lymph node biopsy.  Pathology showed grade 1 lobular carcinoma multifocal, largest focus was 1.7 cm invading the pectoralis muscle additional foci were 1.4 cm and 1 mm respectively.  Sentinel lymph node was also positive with extracapsular extension.  Tumor was ER PR positive and HER-2/neu negative.  Patient received dose dense AC followed by 12 weeks of Taxol.  She also received adjuvant radiation to her chest and axilla.  She then started taking Femara.  Patient also diagnosed with multiple left lower lobe pulmonary emboli in April 2015.  Hypercoagulable work-up was negative and patient was on Xarelto but stopped taking it subsequently.  Bone density scan in May 2018 showed osteopenia with a score of -1.9.  Patient was not a candidate for FRAX score due to Femara.      Interval history-patient is doing well on letrozole and denies any specific complaints at this time.  Denies any breast concerns  ECOG PS- 1 Pain scale- 0   Review of systems- Review of Systems  Constitutional:  Negative for chills, fever, malaise/fatigue and weight loss.   HENT:  Negative for congestion, ear discharge and nosebleeds.   Eyes:  Negative for blurred vision.  Respiratory:  Negative for cough, hemoptysis, sputum production, shortness of breath and wheezing.   Cardiovascular:  Negative for chest pain, palpitations, orthopnea and claudication.  Gastrointestinal:  Negative for abdominal pain, blood in stool, constipation, diarrhea, heartburn, melena, nausea and vomiting.  Genitourinary:  Negative for dysuria, flank pain, frequency, hematuria and urgency.  Musculoskeletal:  Negative for back pain, joint pain and myalgias.  Skin:  Negative for rash.  Neurological:  Negative for dizziness, tingling, focal weakness, seizures, weakness and headaches.  Endo/Heme/Allergies:  Does not bruise/bleed easily.  Psychiatric/Behavioral:  Negative for depression and suicidal ideas. The patient does not have insomnia.       No Known Allergies   Past Medical History:  Diagnosis Date   Arthritis    Breast cancer (HCC) 2015   chemo and radiation   Cancer (HCC) 05-20-13   T1 N1 ER positive/PR negative HER2 negative. right breast   Diabetes mellitus without complication (HCC)    GERD (gastroesophageal reflux disease)    Hypertension    PE (pulmonary embolism)    Personal history of chemotherapy 2015   Right breast   Personal history of radiation therapy 2015   Right breast   Renal calculi      Past Surgical History:  Procedure Laterality Date   BREAST BIOPSY Right 04-05-13   positive   BREAST SURGERY Right 05/20/13   mastectomy   CESAREAN SECTION  1991   COLONOSCOPY WITH PROPOFOL N/A 12/30/2018   Procedure: COLONOSCOPY WITH PROPOFOL;  Surgeon: Pasty Spillers,  MD;  Location: [facility] END[facility]COPY;  Service: Endoscopy;  Laterality: N/A;   MASTECTOMY Right 2015   chemo and radiation    Social History   Socioeconomic History   Marital status: Married    Spouse name: Not on file   Number of children: Not on file   Years of education: Not on file    Highest education level: Not on file  Occupational History   Not on file  Tobacco Use   Smoking status: Never   Smokeless tobacco: Never  Vaping Use   Vaping status: Never Used  Substance and Sexual Activity   Alcohol use: No   Drug use: No   Sexual activity: Yes  Other Topics Concern   Not on file  Social History Narrative   Not on file   Social Determinants of Health   Financial Resource Strain: Not on file  Food Insecurity: Not on file  Transportation Needs: Not on file  Physical Activity: Not on file  Stress: Not on file  Social Connections: Not on file  Intimate Partner Violence: Not on file    Family History  Problem Relation Age of Onset   Lung cancer Father    Breast cancer Maternal Grandmother 39     Current Outpatient Medications:    Accu-Chek FastClix Lancets MISC, Use as directed for once a day, Disp: 100 each, Rfl: 3   acetaminophen (TYLENOL) 500 MG tablet, Take 500 mg by mouth every 6 (six) hours as needed., Disp: , Rfl:    alendronate (FOSAMAX) 70 MG tablet, Take 1 tablet (70 mg total) by mouth once a week. Take with a full glass of water on an empty stomach., Disp: 4 tablet, Rfl: 2   Blood Glucose Monitoring Suppl (ACCU-CHEK GUIDE ME) w/Device KIT, Use as once a daily  DXE11.65, Disp: 1 kit, Rfl: 0   calcium carbonate (OS-CAL) 600 MG TABS tablet, Take 600 mg by mouth daily., Disp: , Rfl:    Cholecalciferol (VITAMIN D-3) 1000 UNITS CAPS, Take 1 capsule by mouth daily., Disp: , Rfl:    fluticasone (FLONASE) 50 MCG/ACT nasal spray, Place 2 sprays into both nostrils daily., Disp: 16 mL, Rfl: 0   glucose blood (ACCU-CHEK GUIDE) test strip, 1 EACH BY OTHER ROUTE DAILY. USE AS INSTRUCTED TO CHECK BLOOD SUGAR ONCE DAILY DxE11.65, Disp: 100 strip, Rfl: 2   letrozole (FEMARA) 2.5 MG tablet, TAKE 1 TABLET BY MOUTH DAILY, Disp: 90 tablet, Rfl: 4   letrozole (FEMARA) 2.5 MG tablet, Take 1 tablet (2.5 mg total) by mouth daily., Disp: 90 tablet, Rfl: 3   losartan  (COZAAR) 50 MG tablet, TAKE 1 TABLET BY MOUTH EVERY DAY, Disp: 90 tablet, Rfl: 3   metFORMIN (GLUCOPHAGE-XR) 500 MG 24 hr tablet, Take 1 tablet (500 mg total) by mouth daily with breakfast., Disp: 90 tablet, Rfl: 3   pyridoxine (B-6) 100 MG tablet, Vitamin B-6 100 mg tablet  Take 1 tablet twice a day by oral route., Disp: , Rfl:    rosuvastatin (CRESTOR) 5 MG tablet, TAKE 1 TABLET BY MOUTH TWICE A WEEK, Disp: 24 tablet, Rfl: 3  Physical exam:  Vitals:   11/06/22 1006  BP: 134/73  Pulse: 80  Resp: 18  Temp: 98 F (36.7 C)  TempSrc: Tympanic  SpO2: 100%  Weight: 149 lb 11.2 oz (67.9 kg)  Height: 5\' 1"  (1.549 m)   Physical Exam Cardiovascular:     Rate and Rhythm: Normal rate and regular rhythm.     Heart sounds: Normal heart  sounds.  Pulmonary:     Effort: Pulmonary effort is normal.     Breath sounds: Normal breath sounds.  Abdominal:     General: Bowel sounds are normal.     Palpations: Abdomen is soft.  Skin:    General: Skin is warm and dry.  Neurological:     Mental Status: She is alert and oriented to person, place, and time.   Patient is s/p right mastectomy without reconstruction.  No evidence of chest wall recurrence.  No palpable bilateral axillaryAdenopathy.  No palpable masses in the left breast or left axillary adenopathy.     Latest Ref Rng & Units 05/08/2022   10:24 AM  CMP  Glucose 70 - 99 mg/dL 132   BUN 8 - 23 mg/dL 14   Creatinine 4.40 - 1.00 mg/dL 1.02   Sodium 725 - 366 mmol/L 138   Potassium 3.5 - 5.1 mmol/L 3.6   Chloride 98 - 111 mmol/L 106   CO2 22 - 32 mmol/L 23   Calcium 8.9 - 10.3 mg/dL 9.8       Latest Ref Rng & Units 04/05/2022    9:50 AM  CBC  WBC 3.4 - 10.8 x10E3/uL 4.2   Hemoglobin 11.1 - 15.9 g/dL 44.0   Hematocrit 34.7 - 46.6 % 40.6   Platelets 150 - 450 x10E3/uL 237     No images are attached to the encounter.  No results found.   Assessment and plan- Patient is a 72 y.o. female with history of stage IIIb right breast  cancer s/p adjuvant chemotherapy and radiation treatment.  She is currently on letrozole and this is a routine follow-up visit  Clinically patient is doing well with no concerning signs and symptoms of recurrence based on today's exam.  Her mammogram from January 2024 was unremarkable.  I will see her back in no labs.  Density scan in June 2024 which showed worsening T-score especially in her AP spine of 1.7 which was worse as compared to 1.1 in 2022.  Given that she has 1 more year of letrozole left with worsening bone density scores I would recommend that she should start weekly Fosamax at this time.  Discussed risks and benefits of Fosamax including all but not limited to possible reflux disease.  She needs to take it first thing in the morning on an empty stomach and not lie down for the next 1 hour.  Patient understands and agrees to proceed as planned   Visit Diagnosis 1. Osteopenia of neck of left femur   2. Use of letrozole (Femara)   3. Encounter for follow-up surveillance of breast cancer      Dr. Owens Shark, MD, MPH [facility] [phone] 11/07/2022 11:52 AM

## 2022-11-07 NOTE — Telephone Encounter (Signed)
Done

## 2022-11-07 NOTE — Telephone Encounter (Signed)
Pt notified its sent

## 2023-01-20 ENCOUNTER — Encounter: Payer: Self-pay | Admitting: Hematology and Oncology

## 2023-01-20 ENCOUNTER — Ambulatory Visit
Admission: EM | Admit: 2023-01-20 | Discharge: 2023-01-20 | Disposition: A | Discharge status: Home / Self Care | Payer: Medicare Other | Attending: Emergency Medicine | Admitting: Emergency Medicine

## 2023-01-20 ENCOUNTER — Ambulatory Visit: Payer: Medicare Other | Attending: Emergency Medicine

## 2023-01-20 DIAGNOSIS — M7731 Calcaneal spur, right foot: Secondary | ICD-10-CM | POA: Diagnosis not present

## 2023-01-20 DIAGNOSIS — M79604 Pain in right leg: Secondary | ICD-10-CM

## 2023-01-20 DIAGNOSIS — S86301A Unspecified injury of muscle(s) and tendon(s) of peroneal muscle group at lower leg level, right leg, initial encounter: Secondary | ICD-10-CM | POA: Diagnosis not present

## 2023-01-20 NOTE — ED Triage Notes (Signed)
Patient sates that she hit her leg 2 weeks ago. Just bruised and now it still hurting when something touches it. Right leg.

## 2023-01-20 NOTE — Discharge Instructions (Addendum)
I did not appreciate any fracture on your x-ray.  We will contact you if radiology sees something different and we need to change management.  I doubt a blood clot in your leg at this time.  Warm compresses.  May take 200 mg of ibuprofen 3 times a day with 1000 mg of Tylenol for the next 4 to 5 days.  Follow-up with your primary care provider if not getting any better.  Go to the ER if you get worse

## 2023-01-20 NOTE — ED Provider Notes (Signed)
HPI  SUBJECTIVE:  Abigail Wheeler is a 72 y.o. female who presents with 2 weeks of intermittent, minutes long pulling/burning right anterior lateral lower extremity pain after hitting it on a chest.  She reports bruising in the area, but this has resolved.  No mass, erythema, posterior calf pain, new or different knee pain, fevers, calf swelling, pleuritic pain, shortness of breath, hemoptysis.  No distal numbness or tingling.  He has been able to bear weight on this.  She has never had symptoms like this before.  No antipyretic in the past 6 hours.  She tried rubbing alcohol, muscle rub and Tylenol.  Rubbing alcohol helps.  Symptoms are worse at night.  Patient has a past medical history of breast cancer status post chemotherapy, radiation 9 years ago, diabetes, hypertension, PE, osteopenia.  No history of PAD/PVD, peripheral neuropathy, DVT, chronic kidney disease.  She is not on any anticoagulants or antiplatelets.  [facility]P: [facility].  Past Medical History:  Diagnosis Date   Arthritis    Breast cancer (HCC) 2015   chemo and radiation   Cancer ([facility]) 05-20-13   T1 N1 ER positive/PR negative HER2 negative. right breast   Diabetes mellitus without complication (HCC)    GERD (gastroesophageal reflux disease)    Hypertension    PE (pulmonary embolism)    Personal history of chemotherapy 2015   Right breast   Personal history of radiation therapy 2015   Right breast   Renal calculi     Past Surgical History:  Procedure Laterality Date   BREAST BIOPSY Right 04-05-13   positive   BREAST SURGERY Right 05/20/13   mastectomy   CESAREAN SECTION  1991   COLONOSCOPY WITH PROPOFOL N/A 12/30/2018   Procedure: COLONOSCOPY WITH PROPOFOL;  Surgeon: Pasty Spillers, MD;  Location: [facility] ENDOSCOPY;  Service: Endoscopy;  Laterality: N/A;   MASTECTOMY Right 2015   chemo and radiation    Family History  Problem Relation Age of Onset   Lung cancer Father    Breast cancer Maternal Grandmother 63     Social History   Tobacco Use   Smoking status: Never   Smokeless tobacco: Never  Vaping Use   Vaping status: Never Used  Substance Use Topics   Alcohol use: No   Drug use: No    No current facility-administered medications for this encounter.  Current Outpatient Medications:    Accu-Chek FastClix Lancets MISC, Use as directed for once a day, Disp: 100 each, Rfl: 3   acetaminophen (TYLENOL) 500 MG tablet, Take 500 mg by mouth every 6 (six) hours as needed., Disp: , Rfl:    alendronate (FOSAMAX) 70 MG tablet, Take 1 tablet (70 mg total) by mouth once a week. Take with a full glass of water on an empty stomach., Disp: 4 tablet, Rfl: 2   Blood Glucose Monitoring Suppl (ACCU-CHEK GUIDE ME) w/Device KIT, Use as once a daily  DXE11.65, Disp: 1 kit, Rfl: 0   calcium carbonate (OS-CAL) 600 MG TABS tablet, Take 600 mg by mouth daily., Disp: , Rfl:    Cholecalciferol (VITAMIN D-3) 1000 UNITS CAPS, Take 1 capsule by mouth daily., Disp: , Rfl:    fluticasone (FLONASE) 50 MCG/ACT nasal spray, Place 2 sprays into both nostrils daily., Disp: 16 mL, Rfl: 0   glucose blood (ACCU-CHEK GUIDE) test strip, 1 EACH BY OTHER ROUTE DAILY. USE AS INSTRUCTED TO CHECK BLOOD SUGAR ONCE DAILY, Disp: 100 strip, Rfl: 2   letrozole (FEMARA) 2.5 MG tablet, TAKE 1  TABLET BY MOUTH DAILY, Disp: 90 tablet, Rfl: 4   letrozole (FEMARA) 2.5 MG tablet, Take 1 tablet (2.5 mg total) by mouth daily., Disp: 90 tablet, Rfl: 3   losartan (COZAAR) 50 MG tablet, TAKE 1 TABLET BY MOUTH EVERY DAY, Disp: 90 tablet, Rfl: 3   metFORMIN (GLUCOPHAGE-XR) 500 MG 24 hr tablet, Take 1 tablet (500 mg total) by mouth daily with breakfast., Disp: 90 tablet, Rfl: 3   pyridoxine (B-6) 100 MG tablet, Vitamin B-6 100 mg tablet  Take 1 tablet twice a day by oral route., Disp: , Rfl:    rosuvastatin (CRESTOR) 5 MG tablet, TAKE 1 TABLET BY MOUTH TWICE A WEEK, Disp: 24 tablet, Rfl: 3  No Known Allergies   ROS  As noted in HPI.   Physical  Exam  BP 136/76 (BP Location: Left Arm)   Pulse 84   Temp 98.4 F (36.9 C) (Oral)   Resp 17   Wt 68 kg   SpO2 99%   BMI 28.34 kg/m   Constitutional: Well developed, well nourished, no acute distress Eyes:  EOMI, conjunctiva normal bilaterally HENT: Normocephalic, atraumatic,mucus membranes moist Respiratory: Normal inspiratory effort Cardiovascular: Normal rate GI: nondistended skin: No rash, skin intact Musculoskeletal: Calves symmetric, measuring 32 cm bilaterally.  No edema. Right calf.  Soft tissue tenderness several centimeters below the proximal fibula, no bruising, swelling, erythema or other discoloration.  No crepitus.  No tenderness over the knee, tibia, ankle.  No posterior calf tenderness, medial thigh tenderness.  No palpable cord in the area of tenderness or behind the knee. Neurologic: Alert & oriented x 3, no focal neuro deficits Psychiatric: Speech and behavior appropriate   ED Course   Medications - No data to display  Orders Placed This Encounter  Procedures   DG Tibia/Fibula Right    Standing Status:   Standing    Number of Occurrences:   1    Order Specific Question:   Reason for Exam (SYMPTOM  OR DIAGNOSIS REQUIRED)    Answer:   Direct trauma 4 cm below fibular head, tenderness.  No bruising.  History of osteopenia rule out fracture.    No results found for this or any previous visit (from the past 24 hour(s)). No results found.  ED Clinical Impression  1. Right leg pain      ED Assessment/Plan   {The patient has been seen in Urgent Care in the last 3 years. :1}  Patient presents with persistent pain in the area of direct trauma 2 weeks ago on her anterior right lower leg.  There is no discoloration, appreciable soft tissue swelling erythema.  However, given her history of osteopenia, will check a tib-fib to evaluate for fracture.  Will contact patient at [phone] if abnormal.  Doubt superficial venous thrombosis, cellulitis.  Cancer was  9 years ago, no recent immobilization, surgery in the past 12 weeks, calf swelling, collateral veins, leg swelling, tenderness along the deep venous system, pitting edema, recent immobilization of the lower extremity, history of DVT.  Wells score for DVT -2.  Feel that the patient is low risk for DVT, thus a D-dimer was not done.  Will send home with ibuprofen 200 mg along with 1000 mg of Tylenol 3 times a day for 4 to 5 days.  GFR and January 24 above 60.  Warm compresses to the area.  Follow-up with PCP if not better in a week.  Strict ER return precautions given.   Reviewed imaging independently.  No fracture of  the fibula.  Radiology report pending.   Discussed  imaging, MDM, treatment plan, and plan for follow-up with patient. Discussed sn/sx that should prompt return to the ED. patient agrees with plan.   No orders of the defined types were placed in this encounter.     *This clinic note was created using Dragon dictation software. Therefore, there may be occasional mistakes despite careful proofreading.  ?

## 2023-01-24 ENCOUNTER — Other Ambulatory Visit: Payer: Self-pay | Admitting: Oncology

## 2023-01-24 ENCOUNTER — Encounter: Payer: Self-pay | Admitting: Hematology and Oncology

## 2023-01-25 ENCOUNTER — Encounter: Payer: Self-pay | Admitting: Hematology and Oncology

## 2023-01-27 ENCOUNTER — Ambulatory Visit: Payer: Medicare Other

## 2023-01-27 DIAGNOSIS — Z23 Encounter for immunization: Secondary | ICD-10-CM | POA: Diagnosis not present

## 2023-01-27 DIAGNOSIS — Z719 Counseling, unspecified: Secondary | ICD-10-CM

## 2023-01-27 NOTE — Progress Notes (Signed)
Pt seen for Flu vaccine. Eligible for Flu high  dose. Administered and tolerated well. Given VIS and NCIR copies, explained and understood. M.Prashant Glosser, LPN.

## 2023-01-29 ENCOUNTER — Encounter: Payer: Self-pay | Admitting: Hematology and Oncology

## 2023-02-03 ENCOUNTER — Ambulatory Visit: Payer: Medicare Other

## 2023-02-03 DIAGNOSIS — Z23 Encounter for immunization: Secondary | ICD-10-CM | POA: Diagnosis not present

## 2023-02-03 DIAGNOSIS — Z719 Counseling, unspecified: Secondary | ICD-10-CM

## 2023-02-03 NOTE — Progress Notes (Signed)
Vaccine administered in left deltoid (COVID-19 Comirnaty and tolerated well.  Declined waiting 15 minutes for observation, stated they she has always done well in the past.  After vaccine care reviewed.  No questions.  Mikayah Joy Sherrilyn Rist, RN

## 2023-02-11 DIAGNOSIS — E119 Type 2 diabetes mellitus without complications: Secondary | ICD-10-CM | POA: Diagnosis not present

## 2023-02-11 DIAGNOSIS — H40003 Preglaucoma, unspecified, bilateral: Secondary | ICD-10-CM | POA: Diagnosis not present

## 2023-02-11 DIAGNOSIS — H2513 Age-related nuclear cataract, bilateral: Secondary | ICD-10-CM | POA: Diagnosis not present

## 2023-02-17 ENCOUNTER — Other Ambulatory Visit: Payer: Self-pay | Admitting: Physician Assistant

## 2023-02-24 ENCOUNTER — Other Ambulatory Visit: Payer: Self-pay

## 2023-02-24 MED ORDER — ACCU-CHEK SOFTCLIX LANCETS MISC: 3 refills | Status: DC

## 2023-02-27 ENCOUNTER — Other Ambulatory Visit: Payer: Self-pay

## 2023-02-27 MED ORDER — ACCU-CHEK SOFTCLIX LANCETS MISC: 3 refills | Status: DC

## 2023-03-03 ENCOUNTER — Other Ambulatory Visit: Payer: Self-pay

## 2023-03-14 ENCOUNTER — Other Ambulatory Visit: Payer: Self-pay | Admitting: Physician Assistant

## 2023-03-14 DIAGNOSIS — E1165 Type 2 diabetes mellitus with hyperglycemia: Secondary | ICD-10-CM

## 2023-03-28 ENCOUNTER — Other Ambulatory Visit: Payer: Self-pay | Admitting: Physician Assistant

## 2023-03-28 DIAGNOSIS — Z1231 Encounter for screening mammogram for malignant neoplasm of breast: Secondary | ICD-10-CM

## 2023-04-04 ENCOUNTER — Ambulatory Visit (INDEPENDENT_AMBULATORY_CARE_PROVIDER_SITE_OTHER): Payer: Medicare Other | Admitting: Physician Assistant

## 2023-04-04 ENCOUNTER — Encounter: Payer: Self-pay | Admitting: Physician Assistant

## 2023-04-04 VITALS — BP 125/82 | HR 88 | Temp 98.5°F | Resp 16 | Ht 61.0 in | Wt 154.0 lb

## 2023-04-04 DIAGNOSIS — R300 Dysuria: Secondary | ICD-10-CM

## 2023-04-04 DIAGNOSIS — C50811 Malignant neoplasm of overlapping sites of right female breast: Secondary | ICD-10-CM | POA: Diagnosis not present

## 2023-04-04 DIAGNOSIS — E1165 Type 2 diabetes mellitus with hyperglycemia: Secondary | ICD-10-CM

## 2023-04-04 DIAGNOSIS — Z170 Estrogen receptor positive status [ER+]: Secondary | ICD-10-CM | POA: Diagnosis not present

## 2023-04-04 DIAGNOSIS — Z0000 Encounter for general adult medical examination without abnormal findings: Secondary | ICD-10-CM | POA: Diagnosis not present

## 2023-04-04 DIAGNOSIS — E782 Mixed hyperlipidemia: Secondary | ICD-10-CM | POA: Diagnosis not present

## 2023-04-04 DIAGNOSIS — I10 Essential (primary) hypertension: Secondary | ICD-10-CM

## 2023-04-04 LAB — POCT GLYCOSYLATED HEMOGLOBIN (HGB A1C): Hemoglobin A1C: 6.4 % — AB (ref 4.0–5.6)

## 2023-04-04 NOTE — Progress Notes (Unsigned)
[facility] [address] [place], [place] [address]  Internal MEDICINE  Office Visit Note  Patient Name: Abigail Wheeler  [id number]  [id number]  Date of Service: 04/10/2023  Chief Complaint  Patient presents with   Medicare Wellness   Diabetes   Gastroesophageal Reflux   Hypertension   Quality Metric Gaps    TDAP    HPI Maxene presents for an annual well visit and physical exam.  Well-appearing 72 y.o.female Routine CRC screening: UTD, due 2030 Routine mammogram: scheduled in Jan 2025, followed by oncology DEXA scan: Done in July 2024 Eye exam and/or foot exam: eye exam UTD, foot exam done today--normal sensation, hallux on left not bothersome Labs: lab slip given Other concerns: Followed by oncology for hx of breast cancer and is on medication for osteopenia after recent BMD -BG low 100s in AM, well controlled. No concerns today -Due for tdap and will look into this     04/04/2023    9:52 AM 03/29/2022    9:50 AM 02/27/2021    9:09 AM  MMSE - Mini Mental State Exam  Orientation to time 5 5 5   Orientation to Place 5 5 5   Registration 3 3 3   Attention/ Calculation 5 5 5   Recall 3 3 3   Language- name 2 objects 2 2 2   Language- repeat 1 1 1   Language- follow 3 step command 3 3 3   Language- read & follow direction 1 1 1   Write a sentence 1 1 1   Copy design 1 1 1   Total score 30 30 30     Functional Status Survey: Is the patient deaf or have difficulty hearing?: No Does the patient have difficulty seeing, even when wearing glasses/contacts?: No Does the patient have difficulty concentrating, remembering, or making decisions?: No Does the patient have difficulty walking or climbing stairs?: No Does the patient have difficulty dressing or bathing?: No Does the patient have difficulty doing errands alone such as visiting a doctor's office or shopping?: No     06/28/2021    9:38 AM 11/05/2021   11:12 AM 03/29/2022    9:49 AM 05/15/2022    1:23 PM  04/04/2023    9:52 AM  Fall Risk  Falls in the past year? 0  1 0 0  Was there an injury with Fall?   0    Fall Risk Category Calculator   1    Fall Risk Category (Retired)   Low    (RETIRED) Patient Fall Risk Level  Low fall risk          04/04/2023    9:52 AM  Depression screen PHQ 2/9  Decreased Interest 0  Down, Depressed, Hopeless 0  PHQ - 2 Score 0        No data to display            Current Medication: Outpatient Encounter Medications as of 04/04/2023  Medication Sig   Accu-Chek Softclix Lancets lancets USE AS DIRECTED FOR ONCE A DAY Dx E11.65   acetaminophen (TYLENOL) 500 MG tablet Take 500 mg by mouth every 6 (six) hours as needed.   alendronate (FOSAMAX) 70 MG tablet TAKE 1 TABLET (70 MG TOTAL) BY MOUTH ONCE A WEEK. TAKE WITH A FULL GLASS OF WATER ON AN EMPTY STOMACH.   Blood Glucose Monitoring Suppl (ACCU-CHEK GUIDE ME) w/Device KIT Use as once a daily  DXE11.65   calcium carbonate (OS-CAL) 600 MG TABS tablet Take 600 mg by mouth daily.   Cholecalciferol (VITAMIN  D-3) 1000 UNITS CAPS Take 1 capsule by mouth daily.   fluticasone (FLONASE) 50 MCG/ACT nasal spray Place 2 sprays into both nostrils daily.   glucose blood (ACCU-CHEK GUIDE) test strip 1 EACH BY OTHER ROUTE DAILY. USE AS INSTRUCTED TO CHECK BLOOD SUGAR ONCE DAILY   letrozole (FEMARA) 2.5 MG tablet TAKE 1 TABLET BY MOUTH DAILY   letrozole (FEMARA) 2.5 MG tablet Take 1 tablet (2.5 mg total) by mouth daily.   losartan (COZAAR) 50 MG tablet TAKE 1 TABLET BY MOUTH EVERY DAY   metFORMIN (GLUCOPHAGE-XR) 500 MG 24 hr tablet TAKE 1 TABLET BY MOUTH EVERY DAY WITH BREAKFAST   pyridoxine (B-6) 100 MG tablet Vitamin B-6 100 mg tablet  Take 1 tablet twice a day by oral route.   rosuvastatin (CRESTOR) 5 MG tablet TAKE 1 TABLET BY MOUTH TWICE A WEEK   No facility-administered encounter medications on file as of 04/04/2023.    Surgical History: Past Surgical History:  Procedure Laterality Date   BREAST BIOPSY  Right 04-05-13   positive   BREAST SURGERY Right 05/20/13   mastectomy   CESAREAN SECTION  1991   COLONOSCOPY WITH PROPOFOL N/A 12/30/2018   Procedure: COLONOSCOPY WITH PROPOFOL;  Surgeon: Pasty Spillers, MD;  Location: [facility] ENDOSCOPY;  Service: Endoscopy;  Laterality: N/A;   MASTECTOMY Right 2015   chemo and radiation    Medical History: Past Medical History:  Diagnosis Date   Arthritis    Breast cancer (HCC) 2015   chemo and radiation   Cancer (HCC) 05-20-13   T1 N1 ER positive/PR negative HER2 negative. right breast   Diabetes mellitus without complication (HCC)    GERD (gastroesophageal reflux disease)    Hypertension    PE (pulmonary embolism)    Personal history of chemotherapy 2015   Right breast   Personal history of radiation therapy 2015   Right breast   Renal calculi     Family History: Family History  Problem Relation Age of Onset   Lung cancer Father    Breast cancer Maternal Grandmother 91    Social History   Socioeconomic History   Marital status: Married    Spouse name: Not on file   Number of children: Not on file   Years of education: Not on file   Highest education level: Not on file  Occupational History   Not on file  Tobacco Use   Smoking status: Never   Smokeless tobacco: Never  Vaping Use   Vaping status: Never Used  Substance and Sexual Activity   Alcohol use: No   Drug use: No   Sexual activity: Yes  Other Topics Concern   Not on file  Social History Narrative   Not on file   Social Drivers of Health   Financial Resource Strain: Not on file  Food Insecurity: Not on file  Transportation Needs: Not on file  Physical Activity: Not on file  Stress: Not on file  Social Connections: Not on file  Intimate Partner Violence: Not on file      Review of Systems  Constitutional:  Negative for chills, diaphoresis and fatigue.  HENT:  Negative for ear pain, postnasal drip and sinus pressure.   Eyes:  Negative for photophobia,  discharge, redness, itching and visual disturbance.  Respiratory:  Negative for cough, shortness of breath and wheezing.   Cardiovascular:  Negative for chest pain, palpitations and leg swelling.  Gastrointestinal:  Negative for abdominal pain, constipation, diarrhea, nausea and vomiting.  Genitourinary:  Negative  for dysuria and flank pain.  Musculoskeletal:  Negative for arthralgias, back pain, gait problem and neck pain.  Skin:  Negative for color change.  Allergic/Immunologic: Negative for environmental allergies and food allergies.  Neurological:  Negative for dizziness and headaches.  Hematological:  Does not bruise/bleed easily.  Psychiatric/Behavioral:  Negative for agitation, behavioral problems (depression) and hallucinations.     Vital Signs: BP 125/82   Pulse 88   Temp 98.5 F (36.9 C)   Resp 16   Ht 5\' 1"  (1.549 m)   Wt 154 lb (69.9 kg)   SpO2 97%   BMI 29.10 kg/m    Physical Exam Vitals and nursing note reviewed.  Constitutional:      Appearance: Normal appearance. She is well-developed.  HENT:     Head: Normocephalic and atraumatic.  Eyes:     Pupils: Pupils are equal, round, and reactive to light.  Neck:     Thyroid: No thyromegaly.     Vascular: No JVD.     Trachea: No tracheal deviation.  Cardiovascular:     Rate and Rhythm: Normal rate and regular rhythm.     Pulses:          Dorsalis pedis pulses are 3+ on the right side and 3+ on the left side.       Posterior tibial pulses are 3+ on the right side and 3+ on the left side.     Heart sounds: Normal heart sounds.  Pulmonary:     Effort: Pulmonary effort is normal.     Breath sounds: Normal breath sounds.  Musculoskeletal:     Right foot: Normal range of motion.     Left foot: Normal range of motion. Bunion present.  Feet:     Right foot:     Protective Sensation: 4 sites tested.  4 sites sensed.     Left foot:     Protective Sensation: 4 sites tested.  4 sites sensed.  Skin:    General: Skin  is warm and dry.     Findings: No erythema.  Neurological:     Mental Status: She is alert.     Motor: No abnormal muscle tone.     Deep Tendon Reflexes: Reflexes are normal and symmetric.  Psychiatric:        Behavior: Behavior normal.        Thought Content: Thought content normal.        Judgment: Judgment normal.        Assessment/Plan: 1. Encounter for Medicare annual wellness exam (Primary) AWV performed, labs ordered, due for vaccines, otherwise PHM UTD  2. Type 2 diabetes mellitus with hyperglycemia, without long-term current use of insulin (HCC) - POCT HgB A1C is 6.4 which is increased from 6.1 previously. Continue to monitor and work on diet/exercise. - Urine Microalbumin w/creat. ratio  3. Benign hypertension Stable, continue current medication  4. Malignant neoplasm of overlapping sites of right breast in female, estrogen receptor positive (HCC) Followed by oncology  5. Mixed hyperlipidemia Continue crestor and will update labs  6. Dysuria - UA/M w/rflx Culture, Routine     General Counseling: Terrence verbalizes understanding of the findings of todays visit and agrees with plan of treatment. I have discussed any further diagnostic evaluation that may be needed or ordered today. We also reviewed her medications today. she has been encouraged to call the office with any questions or concerns that should arise related to todays visit.    Orders Placed This Encounter  Procedures  Urine Microalbumin w/creat. ratio   UA/M w/rflx Culture, Routine   Specimen status report   POCT HgB A1C    No orders of the defined types were placed in this encounter.   Return in about 6 months (around 10/03/2023) for A1c.   Total time spent:35 Minutes Time spent includes review of chart, medications, test results, and follow up plan with the patient.   [place] Controlled Substance Database was reviewed by me.  This patient was seen by Lynn Ito, PA-C in  collaboration with Dr. Beverely Risen as a part of collaborative care agreement.  Lynn Ito, PA-C Internal medicine

## 2023-04-05 LAB — UA/M W/RFLX CULTURE, ROUTINE

## 2023-04-05 LAB — MICROALBUMIN / CREATININE URINE RATIO
Creatinine, Urine: 90.3 mg/dL
Microalb/Creat Ratio: 11 mg/g{creat} (ref 0–29)
Microalbumin, Urine: 9.7 ug/mL

## 2023-04-05 LAB — SPECIMEN STATUS REPORT

## 2023-04-07 ENCOUNTER — Other Ambulatory Visit: Payer: Self-pay | Admitting: Physician Assistant

## 2023-04-07 DIAGNOSIS — Z0001 Encounter for general adult medical examination with abnormal findings: Secondary | ICD-10-CM | POA: Diagnosis not present

## 2023-04-07 DIAGNOSIS — E782 Mixed hyperlipidemia: Secondary | ICD-10-CM | POA: Diagnosis not present

## 2023-04-07 DIAGNOSIS — E038 Other specified hypothyroidism: Secondary | ICD-10-CM | POA: Diagnosis not present

## 2023-04-07 DIAGNOSIS — R6889 Other general symptoms and signs: Secondary | ICD-10-CM | POA: Diagnosis not present

## 2023-04-07 DIAGNOSIS — R5383 Other fatigue: Secondary | ICD-10-CM | POA: Diagnosis not present

## 2023-04-08 ENCOUNTER — Telehealth: Payer: Self-pay

## 2023-04-08 LAB — CBC WITH DIFFERENTIAL/PLATELET
Basophils Absolute: 0 10*3/uL (ref 0.0–0.2)
Basos: 1 %
EOS (ABSOLUTE): 0.1 10*3/uL (ref 0.0–0.4)
Eos: 1 %
Hematocrit: 39.6 % (ref 34.0–46.6)
Hemoglobin: 13 g/dL (ref 11.1–15.9)
Immature Grans (Abs): 0 10*3/uL (ref 0.0–0.1)
Immature Granulocytes: 0 %
Lymphocytes Absolute: 2.1 10*3/uL (ref 0.7–3.1)
Lymphs: 42 %
MCH: 29.8 pg (ref 26.6–33.0)
MCHC: 32.8 g/dL (ref 31.5–35.7)
MCV: 91 fL (ref 79–97)
Monocytes Absolute: 0.5 10*3/uL (ref 0.1–0.9)
Monocytes: 9 %
Neutrophils Absolute: 2.3 10*3/uL (ref 1.4–7.0)
Neutrophils: 47 %
Platelets: 217 10*3/uL (ref 150–450)
RBC: 4.36 x10E6/uL (ref 3.77–5.28)
RDW: 13.3 % (ref 11.7–15.4)
WBC: 4.9 10*3/uL (ref 3.4–10.8)

## 2023-04-08 LAB — COMPREHENSIVE METABOLIC PANEL
ALT: 22 [IU]/L (ref 0–32)
AST: 23 [IU]/L (ref 0–40)
Albumin: 4.1 g/dL (ref 3.8–4.8)
Alkaline Phosphatase: 52 [IU]/L (ref 44–121)
BUN/Creatinine Ratio: 12 (ref 12–28)
BUN: 11 mg/dL (ref 8–27)
Bilirubin Total: 0.8 mg/dL (ref 0.0–1.2)
CO2: 24 mmol/L (ref 20–29)
Calcium: 10.2 mg/dL (ref 8.7–10.3)
Chloride: 103 mmol/L (ref 96–106)
Creatinine, Ser: 0.91 mg/dL (ref 0.57–1.00)
Globulin, Total: 3.2 g/dL (ref 1.5–4.5)
Glucose: 123 mg/dL — ABNORMAL HIGH (ref 70–99)
Potassium: 4 mmol/L (ref 3.5–5.2)
Sodium: 145 mmol/L — ABNORMAL HIGH (ref 134–144)
Total Protein: 7.3 g/dL (ref 6.0–8.5)
eGFR: 67 mL/min/{1.73_m2} (ref >59)

## 2023-04-08 LAB — LIPID PANEL WITH LDL/HDL RATIO
Cholesterol, Total: 150 mg/dL (ref 100–199)
HDL: 70 mg/dL (ref >39)
LDL Chol Calc (NIH): 66 mg/dL (ref 0–99)
LDL/HDL Ratio: 0.9 {ratio} (ref 0.0–3.2)
Triglycerides: 73 mg/dL (ref 0–149)
VLDL Cholesterol Cal: 14 mg/dL (ref 5–40)

## 2023-04-08 LAB — T4, FREE: Free T4: 1.66 ng/dL (ref 0.82–1.77)

## 2023-04-08 LAB — TSH: TSH: 1.2 u[IU]/mL (ref 0.450–4.500)

## 2023-04-08 NOTE — Telephone Encounter (Signed)
-----   Message from Abigail Wheeler sent at 04/08/2023  1:03 PM EST ----- Please let her know her sugar and sodium were slightly elevated, but otherwise her labs look good

## 2023-04-08 NOTE — Telephone Encounter (Signed)
Left message for patient regarding lab results. 

## 2023-04-09 ENCOUNTER — Telehealth: Payer: Self-pay

## 2023-04-09 NOTE — Telephone Encounter (Signed)
-----   Message from Carlean Jews sent at 04/08/2023  1:03 PM EST ----- Please let her know her sugar and sodium were slightly elevated, but otherwise her labs look good

## 2023-04-09 NOTE — Telephone Encounter (Signed)
Pt notified her sodium and glucose were slightly elevated all other labs are normal

## 2023-04-30 ENCOUNTER — Encounter: Payer: Self-pay | Admitting: Hematology and Oncology

## 2023-05-07 ENCOUNTER — Other Ambulatory Visit: Payer: Self-pay | Admitting: Physician Assistant

## 2023-05-07 ENCOUNTER — Ambulatory Visit
Admission: RE | Admit: 2023-05-07 | Discharge: 2023-05-07 | Disposition: A | Discharge status: Home / Self Care | Payer: Medicare HMO | Source: Ambulatory Visit | Attending: Physician Assistant | Admitting: Physician Assistant

## 2023-05-07 ENCOUNTER — Encounter: Payer: Self-pay | Admitting: Hematology and Oncology

## 2023-05-07 DIAGNOSIS — Z1231 Encounter for screening mammogram for malignant neoplasm of breast: Secondary | ICD-10-CM

## 2023-05-09 ENCOUNTER — Encounter: Payer: Self-pay | Admitting: Oncology

## 2023-05-09 ENCOUNTER — Inpatient Hospital Stay: Payer: Medicare HMO | Attending: Oncology | Admitting: Oncology

## 2023-05-09 VITALS — BP 123/62 | HR 81 | Temp 98.5°F | Resp 18 | Wt 149.3 lb

## 2023-05-09 DIAGNOSIS — C50911 Malignant neoplasm of unspecified site of right female breast: Secondary | ICD-10-CM | POA: Insufficient documentation

## 2023-05-09 DIAGNOSIS — Z9011 Acquired absence of right breast and nipple: Secondary | ICD-10-CM | POA: Diagnosis not present

## 2023-05-09 DIAGNOSIS — Z79899 Other long term (current) drug therapy: Secondary | ICD-10-CM

## 2023-05-09 DIAGNOSIS — Z08 Encounter for follow-up examination after completed treatment for malignant neoplasm: Secondary | ICD-10-CM

## 2023-05-09 DIAGNOSIS — Z170 Estrogen receptor positive status [ER+]: Secondary | ICD-10-CM | POA: Insufficient documentation

## 2023-05-09 DIAGNOSIS — Z79811 Long term (current) use of aromatase inhibitors: Secondary | ICD-10-CM | POA: Diagnosis not present

## 2023-05-09 DIAGNOSIS — Z803 Family history of malignant neoplasm of breast: Secondary | ICD-10-CM | POA: Insufficient documentation

## 2023-05-09 DIAGNOSIS — Z86711 Personal history of pulmonary embolism: Secondary | ICD-10-CM | POA: Insufficient documentation

## 2023-05-09 DIAGNOSIS — Z923 Personal history of irradiation: Secondary | ICD-10-CM | POA: Insufficient documentation

## 2023-05-09 DIAGNOSIS — Z853 Personal history of malignant neoplasm of breast: Secondary | ICD-10-CM | POA: Diagnosis not present

## 2023-05-09 DIAGNOSIS — Z1732 Human epidermal growth factor receptor 2 negative status: Secondary | ICD-10-CM | POA: Insufficient documentation

## 2023-05-09 DIAGNOSIS — M85852 Other specified disorders of bone density and structure, left thigh: Secondary | ICD-10-CM | POA: Diagnosis not present

## 2023-05-09 DIAGNOSIS — Z801 Family history of malignant neoplasm of trachea, bronchus and lung: Secondary | ICD-10-CM | POA: Diagnosis not present

## 2023-05-09 DIAGNOSIS — Z9221 Personal history of antineoplastic chemotherapy: Secondary | ICD-10-CM | POA: Insufficient documentation

## 2023-05-09 DIAGNOSIS — Z1721 Progesterone receptor positive status: Secondary | ICD-10-CM | POA: Diagnosis not present

## 2023-05-09 NOTE — Progress Notes (Signed)
Hematology/Oncology Consult note [facility]  [phone] [phone]  Patient Care Team: Alan Ripper as PCP - General (Physician Assistant) Kieth Brightly, MD (General Surgery) Lyndon Code, MD (Internal Medicine) Karen Kitchens as Physician Assistant (Emergency Medicine) Creig Hines, MD as Consulting Physician (Oncology)   Name of the patient: Abigail Wheeler  [id number]  1950-08-23   Date of visit: 05/09/23  Diagnosis-  history of stage IIIb right breast cancer currently on Femara   Chief complaint/ Reason for visit-routine follow-up of breast cancer on Femara  Heme/Onc history: Patient is a 73 year old female who sees Dr. Merlene Pulling for her history of stage IIIb right breast cancer which was diagnosed in January 2015.  She is status post right mastectomy and sentinel lymph node biopsy.  Pathology showed grade 1 lobular carcinoma multifocal, largest focus was 1.7 cm invading the pectoralis muscle additional foci were 1.4 cm and 1 mm respectively.  Sentinel lymph node was also positive with extracapsular extension.  Tumor was ER PR positive and HER-2/neu negative.  Patient received dose dense AC followed by 12 weeks of Taxol.  She also received adjuvant radiation to her chest and axilla.  She then started taking Femara.  Patient also diagnosed with multiple left lower lobe pulmonary emboli in April 2015.  Hypercoagulable work-up was negative and patient was on Xarelto but stopped taking it subsequently.  Bone density scan in May 2018 showed osteopenia with a score of -1.9.  Patient was not a candidate for FRAX score due to Femara.    Interval history-patient is tolerating Femara well without any significant side effects.  She will be completing 10 years of treatment sometime in August 2025.  She is also taking her calcium and vitamin D and weekly Fosamax.  Denies any breast concerns or new aches and  pains.  ECOG PS- 1 Pain scale- 0 Opioid associated constipation- no  Review of systems- Review of Systems  Constitutional:  Negative for chills, fever, malaise/fatigue and weight loss.  HENT:  Negative for congestion, ear discharge and nosebleeds.   Eyes:  Negative for blurred vision.  Respiratory:  Negative for cough, hemoptysis, sputum production, shortness of breath and wheezing.   Cardiovascular:  Negative for chest pain, palpitations, orthopnea and claudication.  Gastrointestinal:  Negative for abdominal pain, blood in stool, constipation, diarrhea, heartburn, melena, nausea and vomiting.  Genitourinary:  Negative for dysuria, flank pain, frequency, hematuria and urgency.  Musculoskeletal:  Negative for back pain, joint pain and myalgias.  Skin:  Negative for rash.  Neurological:  Negative for dizziness, tingling, focal weakness, seizures, weakness and headaches.  Endo/Heme/Allergies:  Does not bruise/bleed easily.  Psychiatric/Behavioral:  Negative for depression and suicidal ideas. The patient does not have insomnia.       No Known Allergies   Past Medical History:  Diagnosis Date   Arthritis    Breast cancer (HCC) 2015   chemo and radiation   Cancer (HCC) 05-20-13   T1 N1 ER positive/PR negative HER2 negative. right breast   Diabetes mellitus without complication (HCC)    GERD (gastroesophageal reflux disease)    Hypertension    PE (pulmonary embolism)    Personal history of chemotherapy 2015   Right breast   Personal history of radiation therapy 2015   Right breast   Renal calculi      Past Surgical History:  Procedure Laterality Date   BREAST BIOPSY Right 04-05-13   positive   BREAST SURGERY  Right 05/20/13   mastectomy   CESAREAN SECTION  1991   COLONOSCOPY WITH PROPOFOL N/A 12/30/2018   Procedure: COLONOSCOPY WITH PROPOFOL;  Surgeon: Pasty Spillers, MD;  Location: [facility] ENDOSCOPY;  Service: Endoscopy;  Laterality: N/A;   MASTECTOMY Right 2015   chemo  and radiation    Social History   Socioeconomic History   Marital status: Married    Spouse name: Not on file   Number of children: Not on file   Years of education: Not on file   Highest education level: Not on file  Occupational History   Not on file  Tobacco Use   Smoking status: Never   Smokeless tobacco: Never  Vaping Use   Vaping status: Never Used  Substance and Sexual Activity   Alcohol use: No   Drug use: No   Sexual activity: Yes  Other Topics Concern   Not on file  Social History Narrative   Not on file   Social Drivers of Health   Financial Resource Strain: Not on file  Food Insecurity: Not on file  Transportation Needs: Not on file  Physical Activity: Not on file  Stress: Not on file  Social Connections: Not on file  Intimate Partner Violence: Not on file    Family History  Problem Relation Age of Onset   Lung cancer Father    Breast cancer Maternal Grandmother 37     Current Outpatient Medications:    Accu-Chek Softclix Lancets lancets, USE AS DIRECTED FOR ONCE A DAY Dx E11.65, Disp: 100 each, Rfl: 3   acetaminophen (TYLENOL) 500 MG tablet, Take 500 mg by mouth every 6 (six) hours as needed., Disp: , Rfl:    alendronate (FOSAMAX) 70 MG tablet, TAKE 1 TABLET (70 MG TOTAL) BY MOUTH ONCE A WEEK. TAKE WITH A FULL GLASS OF WATER ON AN EMPTY STOMACH., Disp: 12 tablet, Rfl: 3   Blood Glucose Monitoring Suppl (ACCU-CHEK GUIDE ME) w/Device KIT, Use as once a daily  DXE11.65, Disp: 1 kit, Rfl: 0   calcium carbonate (OS-CAL) 600 MG TABS tablet, Take 600 mg by mouth daily., Disp: , Rfl:    Cholecalciferol (VITAMIN D-3) 1000 UNITS CAPS, Take 1 capsule by mouth daily., Disp: , Rfl:    fluticasone (FLONASE) 50 MCG/ACT nasal spray, Place 2 sprays into both nostrils daily., Disp: 16 mL, Rfl: 0   glucose blood (ACCU-CHEK GUIDE) test strip, 1 EACH BY OTHER ROUTE DAILY. USE AS INSTRUCTED TO CHECK BLOOD SUGAR ONCE DAILY, Disp: 100 strip, Rfl: 2   letrozole (FEMARA) 2.5  MG tablet, TAKE 1 TABLET BY MOUTH DAILY, Disp: 90 tablet, Rfl: 4   letrozole (FEMARA) 2.5 MG tablet, Take 1 tablet (2.5 mg total) by mouth daily., Disp: 90 tablet, Rfl: 3   losartan (COZAAR) 50 MG tablet, TAKE 1 TABLET BY MOUTH EVERY DAY, Disp: 90 tablet, Rfl: 3   metFORMIN (GLUCOPHAGE-XR) 500 MG 24 hr tablet, TAKE 1 TABLET BY MOUTH EVERY DAY WITH BREAKFAST, Disp: 90 tablet, Rfl: 3   pyridoxine (B-6) 100 MG tablet, Vitamin B-6 100 mg tablet  Take 1 tablet twice a day by oral route., Disp: , Rfl:    rosuvastatin (CRESTOR) 5 MG tablet, TAKE 1 TABLET BY MOUTH TWICE A WEEK, Disp: 24 tablet, Rfl: 3  Physical exam:  Vitals:   05/09/23 1028  BP: 123/62  Pulse: 81  Resp: 18  Temp: 98.5 F (36.9 C)  TempSrc: Tympanic  SpO2: 100%  Weight: 149 lb 4.8 oz (67.7 kg)   Physical  Exam Cardiovascular:     Rate and Rhythm: Normal rate and regular rhythm.     Heart sounds: Normal heart sounds.  Pulmonary:     Effort: Pulmonary effort is normal.     Breath sounds: Normal breath sounds.  Skin:    General: Skin is warm and dry.  Neurological:     Mental Status: She is alert and oriented to person, place, and time.   Breast exam: Patient is s/p right mastectomy without reconstruction.  No evidence of chest wall recurrence.  No palpable bilateral axillary adenopathy.  No palpable left breast masses.     Latest Ref Rng & Units 04/07/2023    9:39 AM  CMP  Glucose 70 - 99 mg/dL 413   BUN 8 - 27 mg/dL 11   Creatinine 2.44 - 1.00 mg/dL 0.10   Sodium 272 - 536 mmol/L 145   Potassium 3.5 - 5.2 mmol/L 4.0   Chloride 96 - 106 mmol/L 103   CO2 20 - 29 mmol/L 24   Calcium 8.7 - 10.3 mg/dL 64.4   Total Protein 6.0 - 8.5 g/dL 7.3   Total Bilirubin 0.0 - 1.2 mg/dL 0.8   Alkaline Phos 44 - 121 IU/L 52   AST 0 - 40 IU/L 23   ALT 0 - 32 IU/L 22       Latest Ref Rng & Units 04/07/2023    9:39 AM  CBC  WBC 3.4 - 10.8 x10E3/uL 4.9   Hemoglobin 11.1 - 15.9 g/dL 03.4   Hematocrit 74.2 - 46.6 % 39.6    Platelets 150 - 450 x10E3/uL 217     No images are attached to the encounter.  MM 3D SCREENING MAMMOGRAM UNILATERAL LEFT BREAST Result Date: 05/08/2023 CLINICAL DATA:  Screening. EXAM: DIGITAL SCREENING UNILATERAL LEFT MAMMOGRAM WITH CAD AND TOMOSYNTHESIS TECHNIQUE: Left screening digital craniocaudal and mediolateral oblique mammograms were obtained. Left screening digital breast tomosynthesis was performed. The images were evaluated with computer-aided detection. COMPARISON:  Previous exam(s). ACR Breast Density Category b: There are scattered areas of fibroglandular density. FINDINGS: The patient has had a right mastectomy. There are no findings suspicious for malignancy. IMPRESSION: No mammographic evidence of malignancy. A result letter of this screening mammogram will be mailed directly to the patient. RECOMMENDATION: Screening mammogram in one year.  (Code:SM-L-78M) BI-RADS CATEGORY  1: Negative. Electronically Signed   By: Frederico Hamman M.D.   On: 05/08/2023 10:44     Assessment and plan- Patient is a 73 y.o. female with history of stage IIIb right breast cancer s/p adjuvant chemotherapy and radiation treatment.  She is here for routine follow-up of breast cancer on letrozole  Patient will be completing 10 years of letrozole and sometime in August 2025 and following that she can stop it.  She was noted to have worsening osteopenia based on her bone density scan in June 2024 and will therefore continue with Fosamax I will plan to get a repeat bone density scan sometime in late 2026.  Clinically she is doing well with no concerning signs and symptoms of recurrence based on today's exam.  I will see her back in 1 year no labs   Visit Diagnosis 1. Encounter for follow-up surveillance of breast cancer   2. Use of letrozole (Femara)   3. Osteopenia of neck of left femur      Dr. Owens Shark, MD, MPH [facility] at [facility] [phone] 05/09/2023 11:08 AM

## 2023-05-14 ENCOUNTER — Ambulatory Visit: Payer: Medicare HMO | Admitting: Surgery

## 2023-05-17 ENCOUNTER — Other Ambulatory Visit: Payer: Self-pay | Admitting: Physician Assistant

## 2023-05-17 DIAGNOSIS — I10 Essential (primary) hypertension: Secondary | ICD-10-CM

## 2023-05-19 ENCOUNTER — Other Ambulatory Visit: Payer: Self-pay | Admitting: Internal Medicine

## 2023-05-21 ENCOUNTER — Ambulatory Visit (INDEPENDENT_AMBULATORY_CARE_PROVIDER_SITE_OTHER): Payer: Medicare HMO | Admitting: Surgery

## 2023-05-21 ENCOUNTER — Other Ambulatory Visit: Payer: Self-pay

## 2023-05-21 ENCOUNTER — Encounter: Payer: Self-pay | Admitting: Surgery

## 2023-05-21 VITALS — BP 125/75 | HR 91 | Temp 98.0°F | Ht 61.0 in | Wt 149.0 lb

## 2023-05-21 DIAGNOSIS — Z853 Personal history of malignant neoplasm of breast: Secondary | ICD-10-CM

## 2023-05-21 MED ORDER — ACCU-CHEK GUIDE TEST VI STRP: ORAL_STRIP | 2 refills | Status: DC

## 2023-05-21 NOTE — Patient Instructions (Signed)
Patient will be asked to return to the office in one year with a screening mammogram. We will send you a letter about these appointments.  You may use Aquaphor for any dryness.    Continue self breast exams. Call office for any new breast issues or concerns.

## 2023-05-23 ENCOUNTER — Encounter: Payer: Self-pay | Admitting: Surgery

## 2023-05-23 NOTE — Progress Notes (Signed)
Outpatient Surgical Follow Up    Abigail Wheeler is an 73 y.o. female.   Chief Complaint  Patient presents with   Follow-up    HPI: Abigail Wheeler is a 73 year old female well-known to our practice with a history of mastectomy on the right side for invasive carcinoma multifocal lobular CA by Dr. Evette Cristal 2015.  ER/PR + Her2 (-). SHe did receive adjuvant XRT. She did receive adjuvant chemotherapy.  She is currently on Femara and Dr. Smith Robert is following her. she is doing very well.  Denies any breast masses.  No nipple discharge.  No fevers no chills no weight loss. Recent  Mammogram personally reviewed showing no evidence of any concerning lesions on the left.  Past Medical History:  Diagnosis Date   Arthritis    Breast cancer (HCC) 2015   chemo and radiation   Cancer (HCC) 05-20-13   T1 N1 ER positive/PR negative HER2 negative. right breast   Diabetes mellitus without complication (HCC)    GERD (gastroesophageal reflux disease)    Hypertension    PE (pulmonary embolism)    Personal history of chemotherapy 2015   Right breast   Personal history of radiation therapy 2015   Right breast   Renal calculi     Past Surgical History:  Procedure Laterality Date   BREAST BIOPSY Right 04-05-13   positive   BREAST SURGERY Right 05/20/13   mastectomy   CESAREAN SECTION  1991   COLONOSCOPY WITH PROPOFOL N/A 12/30/2018   Procedure: COLONOSCOPY WITH PROPOFOL;  Surgeon: Pasty Spillers, MD;  Location: [facility] ENDOSCOPY;  Service: Endoscopy;  Laterality: N/A;   MASTECTOMY Right 2015   chemo and radiation    Family History  Problem Relation Age of Onset   Lung cancer Father    Breast cancer Maternal Grandmother 21    Social History:  reports that she has never smoked. She has never been exposed to tobacco smoke. She has never used smokeless tobacco. She reports that she does not drink alcohol and does not use drugs.  Allergies: No Known Allergies  Medications reviewed.    ROS Full ROS  performed and is otherwise negative other than what is stated in HPI   BP 125/75   Pulse 91   Temp 98 F (36.7 C)   Ht 5\' 1"  (1.549 m)   Wt 149 lb (67.6 kg)   SpO2 95%   BMI 28.15 kg/m   Physical Exam Vitals and nursing note reviewed. Exam conducted with a chaperone present.  Constitutional:      General: She is not in acute distress.    Appearance: Normal appearance. She is normal weight.  Eyes:     General: No scleral icterus.       Right eye: No discharge.        Left eye: No discharge.  Cardiovascular:     Rate and Rhythm: Normal rate and regular rhythm.     Heart sounds: No murmur.  Pulmonary:     Effort: Pulmonary effort is normal. No respiratory distress.     Breath sounds: Normal breath sounds. No stridor.     Comments: BREAST: Right mastectomy scar. No masses No LAD. Excess tissue on right axilla from mastectomy scar. No masses and no infection. Left breast w/o concerning mass. Abdominal:     General: Abdomen is flat. There is no distension.     Palpations: Abdomen is soft. There is no mass.     Tenderness: There is no abdominal tenderness.  Hernia: No hernia is present.  Musculoskeletal:        General: No swelling or tenderness. Normal range of motion.  Skin:    General: Skin is warm and dry.     Capillary Refill: Capillary refill takes less than 2 seconds.  Neurological:     General: No focal deficit present.     Mental Status: She is alert and oriented to person, place, and time.  Psychiatric:        Mood and Affect: Mood normal.        Behavior: Behavior normal.        Thought Content: Thought content normal.        Judgment: Judgment normal.      Assessment/Plan:  Very pleasant 73 year old female with a history of Right  breast cancer no evidence of local recurrence no evidence of new concerning breast pathology on contralateral side or evidence of chest wall lesions.  We will see her back in 1 year with physical examination mammogram . All  questions answered and counseling provided  I spent 30 minutes in this encounter including personally reviewing imaging studies, coordination of her care, counseling, reviewing medical records, placing orders and performing appropriate documentation      Sterling Big, MD [facility] General Surgeon

## 2023-05-27 ENCOUNTER — Other Ambulatory Visit: Payer: Self-pay

## 2023-05-27 MED ORDER — ONETOUCH DELICA LANCETS 30G MISC: 3 refills | Status: DC

## 2023-05-27 MED ORDER — ONETOUCH VERIO VI STRP: 1.0000 | ORAL_STRIP | Freq: Every day | 3 refills | Status: AC

## 2023-05-27 MED ORDER — ONETOUCH VERIO W/DEVICE KIT: PACK | 0 refills | Status: AC

## 2023-06-02 ENCOUNTER — Other Ambulatory Visit: Payer: Self-pay | Admitting: Internal Medicine

## 2023-06-02 ENCOUNTER — Other Ambulatory Visit: Payer: Self-pay | Admitting: Physician Assistant

## 2023-06-02 ENCOUNTER — Other Ambulatory Visit: Payer: Self-pay | Admitting: Oncology

## 2023-06-04 ENCOUNTER — Other Ambulatory Visit: Payer: Self-pay | Admitting: Internal Medicine

## 2023-06-06 ENCOUNTER — Encounter: Payer: Self-pay | Admitting: Hematology and Oncology

## 2023-08-26 DIAGNOSIS — H2513 Age-related nuclear cataract, bilateral: Secondary | ICD-10-CM | POA: Diagnosis not present

## 2023-08-26 DIAGNOSIS — E119 Type 2 diabetes mellitus without complications: Secondary | ICD-10-CM | POA: Diagnosis not present

## 2023-08-26 DIAGNOSIS — H40003 Preglaucoma, unspecified, bilateral: Secondary | ICD-10-CM | POA: Diagnosis not present

## 2023-09-06 ENCOUNTER — Encounter: Payer: Self-pay | Admitting: Hematology and Oncology

## 2023-09-29 ENCOUNTER — Encounter: Payer: Self-pay | Admitting: Hematology and Oncology

## 2023-10-06 ENCOUNTER — Ambulatory Visit (INDEPENDENT_AMBULATORY_CARE_PROVIDER_SITE_OTHER): Payer: Medicare Other | Admitting: Physician Assistant

## 2023-10-06 ENCOUNTER — Encounter: Payer: Self-pay | Admitting: Physician Assistant

## 2023-10-06 VITALS — BP 100/70 | HR 83 | Temp 97.1°F | Resp 16 | Ht 61.0 in | Wt 147.0 lb

## 2023-10-06 DIAGNOSIS — I10 Essential (primary) hypertension: Secondary | ICD-10-CM

## 2023-10-06 DIAGNOSIS — R5383 Other fatigue: Secondary | ICD-10-CM

## 2023-10-06 DIAGNOSIS — Z1329 Encounter for screening for other suspected endocrine disorder: Secondary | ICD-10-CM

## 2023-10-06 DIAGNOSIS — E782 Mixed hyperlipidemia: Secondary | ICD-10-CM

## 2023-10-06 DIAGNOSIS — E1165 Type 2 diabetes mellitus with hyperglycemia: Secondary | ICD-10-CM | POA: Diagnosis not present

## 2023-10-06 LAB — POCT GLYCOSYLATED HEMOGLOBIN (HGB A1C): Hemoglobin A1C: 6.2 % — AB (ref 4.0–5.6)

## 2023-10-06 MED ORDER — ONETOUCH DELICA PLUS LANCET30G MISC: 2 refills | Status: DC

## 2023-10-06 NOTE — Progress Notes (Signed)
 [facility] [address] [place], [place] [address]  Internal MEDICINE  Office Visit Note  Patient Name: Abigail Wheeler  [id number]  [id number]  Date of Service: 10/06/2023  Chief Complaint  Patient presents with   Follow-up   Diabetes    HPI Pt is here for routine follow up -doing well, no concerns today -trying to stay outside more, using stationary bike 10-86mins per night -BG this morning 139, but had splurged yesterday. Has been in low 100s typically -Bp not checked recently -followed by [facility] and oncology and reports she will be stopping Femara  this year after 10 years. Had mammogram done -will thinks about vaccines  Current Medication: Outpatient Encounter Medications as of 10/06/2023  Medication Sig   acetaminophen (TYLENOL) 500 MG tablet Take 500 mg by mouth every 6 (six) hours as needed.   alendronate  (FOSAMAX ) 70 MG tablet TAKE 1 TABLET (70 MG TOTAL) BY MOUTH ONCE A WEEK. TAKE WITH A FULL GLASS OF WATER ON AN EMPTY STOMACH.   Blood Glucose Monitoring Suppl (ONETOUCH VERIO) w/Device KIT Use as directed Dx e11.65   calcium  carbonate (OS-CAL) 600 MG TABS tablet Take 600 mg by mouth daily.   Cholecalciferol (VITAMIN D -3) 1000 UNITS CAPS Take 1 capsule by mouth daily.   glucose blood (ONETOUCH VERIO) test strip 1 each by Other route daily at 12 noon. Use as directed once a daily DX E11.65   Lancets (ONETOUCH DELICA PLUS LANCET30G) MISC Use as directed.   letrozole  (FEMARA ) 2.5 MG tablet TAKE ONE TABLET BY MOUTH EVERY DAY   losartan  (COZAAR ) 50 MG tablet TAKE 1 TABLET BY MOUTH EVERY DAY   metFORMIN  (GLUCOPHAGE -XR) 500 MG 24 hr tablet TAKE 1 TABLET BY MOUTH EVERY DAY WITH BREAKFAST   pyridoxine (B-6) 100 MG tablet Vitamin B-6 100 mg tablet  Take 1 tablet twice a day by oral route.   rosuvastatin  (CRESTOR ) 5 MG tablet TAKE 1 TABLET BY MOUTH TWICE A WEEK   [DISCONTINUED] OneTouch Delica Lancets 30G MISC Use as directed once a daily Dx E11.65   No  facility-administered encounter medications on file as of 10/06/2023.    Surgical History: Past Surgical History:  Procedure Laterality Date   BREAST BIOPSY Right 04-05-13   positive   BREAST SURGERY Right 05/20/13   mastectomy   CESAREAN SECTION  1991   COLONOSCOPY WITH PROPOFOL  N/A 12/30/2018   Procedure: COLONOSCOPY WITH PROPOFOL ;  Surgeon: Irby Mannan, MD;  Location: [facility] ENDOSCOPY;  Service: Endoscopy;  Laterality: N/A;   MASTECTOMY Right 2015   chemo and radiation    Medical History: Past Medical History:  Diagnosis Date   Arthritis    Breast cancer (HCC) 2015   chemo and radiation   Cancer (HCC) 05-20-13   T1 N1 ER positive/PR negative HER2 negative. right breast   Diabetes mellitus without complication (HCC)    GERD (gastroesophageal reflux disease)    Hypertension    PE (pulmonary embolism)    Personal history of chemotherapy 2015   Right breast   Personal history of radiation therapy 2015   Right breast   Renal calculi     Family History: Family History  Problem Relation Age of Onset   Lung cancer Father    Breast cancer Maternal Grandmother 35    Social History   Socioeconomic History   Marital status: Married    Spouse name: Not on file   Number of children: Not on file   Years of education: Not on file  Highest education level: Not on file  Occupational History   Not on file  Tobacco Use   Smoking status: Never    Passive exposure: Never   Smokeless tobacco: Never  Vaping Use   Vaping status: Never Used  Substance and Sexual Activity   Alcohol use: No   Drug use: No   Sexual activity: Yes  Other Topics Concern   Not on file  Social History Narrative   Not on file   Social Drivers of Health   Financial Resource Strain: Not on file  Food Insecurity: Not on file  Transportation Needs: Not on file  Physical Activity: Not on file  Stress: Not on file  Social Connections: Not on file  Intimate Partner Violence: Not on file       Review of Systems  Constitutional:  Negative for chills, diaphoresis and fatigue.  HENT:  Negative for ear pain, postnasal drip and sinus pressure.   Eyes:  Negative for photophobia, discharge, redness, itching and visual disturbance.  Respiratory:  Negative for cough, shortness of breath and wheezing.   Cardiovascular:  Negative for chest pain, palpitations and leg swelling.  Gastrointestinal:  Negative for abdominal pain, constipation, diarrhea, nausea and vomiting.  Genitourinary:  Negative for dysuria and flank pain.  Musculoskeletal:  Negative for arthralgias, back pain, gait problem and neck pain.  Skin:  Negative for color change.  Allergic/Immunologic: Negative for environmental allergies and food allergies.  Neurological:  Negative for dizziness and headaches.  Hematological:  Does not bruise/bleed easily.  Psychiatric/Behavioral:  Negative for agitation, behavioral problems (depression) and hallucinations.     Vital Signs: BP 100/70   Pulse 83   Temp (!) 97.1 F (36.2 C)   Resp 16   Ht 5' 1 (1.549 m)   Wt 147 lb (66.7 kg)   SpO2 97%   BMI 27.78 kg/m    Physical Exam Vitals and nursing note reviewed.  Constitutional:      General: She is not in acute distress.    Appearance: She is well-developed. She is not diaphoretic.  HENT:     Head: Normocephalic and atraumatic.   Eyes:     Extraocular Movements: Extraocular movements intact.   Neck:     Thyroid : No thyromegaly.     Vascular: No JVD.     Trachea: No tracheal deviation.   Cardiovascular:     Rate and Rhythm: Normal rate and regular rhythm.     Heart sounds: Normal heart sounds. No murmur heard.    No friction rub. No gallop.  Pulmonary:     Effort: Pulmonary effort is normal. No respiratory distress.     Breath sounds: No wheezing or rales.  Chest:     Chest wall: No tenderness.  Abdominal:     General: Bowel sounds are normal.   Musculoskeletal:        General: Normal range of motion.      Cervical back: Neck supple.   Skin:    General: Skin is warm and dry.   Neurological:     Mental Status: She is alert and oriented to person, place, and time.   Psychiatric:        Behavior: Behavior normal.        Thought Content: Thought content normal.        Judgment: Judgment normal.        Assessment/Plan: 1. Type 2 diabetes mellitus with hyperglycemia, without long-term current use of insulin (HCC) (Primary) - POCT HgB A1C is 6.2  which is improved from 6.4 last visit. Continue current medication - Lancets (ONETOUCH DELICA PLUS LANCET30G) MISC; Use as directed.  Dispense: 100 each; Refill: 2  2. Benign hypertension BP initially low, but higher on recheck and is due to to take meds. Will monitor at home and call if any concerns. No lightheadedness  3. Mixed hyperlipidemia Continue crestor  and will update labs - Lipid Panel With LDL/HDL Ratio  4. Thyroid  disorder screen - TSH + free T4  5. Other fatigue - CBC w/Diff/Platelet - Comprehensive metabolic panel with GFR - TSH + free T4 - Lipid Panel With LDL/HDL Ratio   General Counseling: Atarah verbalizes understanding of the findings of todays visit and agrees with plan of treatment. I have discussed any further diagnostic evaluation that may be needed or ordered today. We also reviewed her medications today. she has been encouraged to call the office with any questions or concerns that should arise related to todays visit.    Orders Placed This Encounter  Procedures   CBC w/Diff/Platelet   Comprehensive metabolic panel with GFR   TSH + free T4   Lipid Panel With LDL/HDL Ratio   POCT HgB A1C    Meds ordered this encounter  Medications   Lancets (ONETOUCH DELICA PLUS LANCET30G) MISC    Sig: Use as directed.    Dispense:  100 each    Refill:  2    This patient was seen by Taylor Favia, PA-C in collaboration with Dr. Verneta Gone as a part of collaborative care agreement.   Total time spent:30  Minutes Time spent includes review of chart, medications, test results, and follow up plan with the patient.      Dr Fozia M Khan Internal medicine

## 2023-10-10 DIAGNOSIS — Z7983 Long term (current) use of bisphosphonates: Secondary | ICD-10-CM | POA: Diagnosis not present

## 2023-10-10 DIAGNOSIS — Z86711 Personal history of pulmonary embolism: Secondary | ICD-10-CM | POA: Diagnosis not present

## 2023-10-10 DIAGNOSIS — E119 Type 2 diabetes mellitus without complications: Secondary | ICD-10-CM | POA: Diagnosis not present

## 2023-10-10 DIAGNOSIS — Z79811 Long term (current) use of aromatase inhibitors: Secondary | ICD-10-CM | POA: Diagnosis not present

## 2023-10-10 DIAGNOSIS — E663 Overweight: Secondary | ICD-10-CM | POA: Diagnosis not present

## 2023-10-10 DIAGNOSIS — I110 Hypertensive heart disease with heart failure: Secondary | ICD-10-CM | POA: Diagnosis not present

## 2023-10-10 DIAGNOSIS — Z8249 Family history of ischemic heart disease and other diseases of the circulatory system: Secondary | ICD-10-CM | POA: Diagnosis not present

## 2023-10-10 DIAGNOSIS — M810 Age-related osteoporosis without current pathological fracture: Secondary | ICD-10-CM | POA: Diagnosis not present

## 2023-10-10 DIAGNOSIS — C50919 Malignant neoplasm of unspecified site of unspecified female breast: Secondary | ICD-10-CM | POA: Diagnosis not present

## 2023-10-10 DIAGNOSIS — Z7984 Long term (current) use of oral hypoglycemic drugs: Secondary | ICD-10-CM | POA: Diagnosis not present

## 2023-10-10 DIAGNOSIS — Z6827 Body mass index (BMI) 27.0-27.9, adult: Secondary | ICD-10-CM | POA: Diagnosis not present

## 2023-10-10 DIAGNOSIS — I509 Heart failure, unspecified: Secondary | ICD-10-CM | POA: Diagnosis not present

## 2023-11-14 ENCOUNTER — Other Ambulatory Visit: Payer: Self-pay | Admitting: Physician Assistant

## 2023-12-18 ENCOUNTER — Other Ambulatory Visit: Payer: Self-pay | Admitting: Oncology

## 2024-02-16 ENCOUNTER — Other Ambulatory Visit: Payer: Self-pay | Admitting: Physician Assistant

## 2024-02-16 DIAGNOSIS — E1165 Type 2 diabetes mellitus with hyperglycemia: Secondary | ICD-10-CM

## 2024-03-09 DIAGNOSIS — E119 Type 2 diabetes mellitus without complications: Secondary | ICD-10-CM | POA: Diagnosis not present

## 2024-03-09 DIAGNOSIS — H2513 Age-related nuclear cataract, bilateral: Secondary | ICD-10-CM | POA: Diagnosis not present

## 2024-03-09 DIAGNOSIS — H40003 Preglaucoma, unspecified, bilateral: Secondary | ICD-10-CM | POA: Diagnosis not present

## 2024-03-09 LAB — HM DIABETES EYE EXAM

## 2024-03-10 DIAGNOSIS — H524 Presbyopia: Secondary | ICD-10-CM | POA: Diagnosis not present

## 2024-03-10 DIAGNOSIS — H52209 Unspecified astigmatism, unspecified eye: Secondary | ICD-10-CM | POA: Diagnosis not present

## 2024-03-10 DIAGNOSIS — H5203 Hypermetropia, bilateral: Secondary | ICD-10-CM | POA: Diagnosis not present

## 2024-03-16 ENCOUNTER — Other Ambulatory Visit: Payer: Self-pay

## 2024-03-31 ENCOUNTER — Other Ambulatory Visit: Payer: Self-pay

## 2024-03-31 DIAGNOSIS — Z1231 Encounter for screening mammogram for malignant neoplasm of breast: Secondary | ICD-10-CM

## 2024-04-02 DIAGNOSIS — E782 Mixed hyperlipidemia: Secondary | ICD-10-CM | POA: Diagnosis not present

## 2024-04-02 DIAGNOSIS — Z1329 Encounter for screening for other suspected endocrine disorder: Secondary | ICD-10-CM | POA: Diagnosis not present

## 2024-04-02 DIAGNOSIS — R5383 Other fatigue: Secondary | ICD-10-CM | POA: Diagnosis not present

## 2024-04-03 LAB — CBC WITH DIFFERENTIAL/PLATELET
Basophils Absolute: 0 x10E3/uL (ref 0.0–0.2)
Basos: 1 %
EOS (ABSOLUTE): 0.1 x10E3/uL (ref 0.0–0.4)
Eos: 1 %
Hematocrit: 40.1 % (ref 34.0–46.6)
Hemoglobin: 13.1 g/dL (ref 11.1–15.9)
Immature Grans (Abs): 0 x10E3/uL (ref 0.0–0.1)
Immature Granulocytes: 0 %
Lymphocytes Absolute: 2.3 x10E3/uL (ref 0.7–3.1)
Lymphs: 47 %
MCH: 29.6 pg (ref 26.6–33.0)
MCHC: 32.7 g/dL (ref 31.5–35.7)
MCV: 91 fL (ref 79–97)
Monocytes Absolute: 0.4 x10E3/uL (ref 0.1–0.9)
Monocytes: 9 %
Neutrophils Absolute: 2 x10E3/uL (ref 1.4–7.0)
Neutrophils: 42 %
Platelets: 261 x10E3/uL (ref 150–450)
RBC: 4.42 x10E6/uL (ref 3.77–5.28)
RDW: 12.5 % (ref 11.7–15.4)
WBC: 4.8 x10E3/uL (ref 3.4–10.8)

## 2024-04-03 LAB — TSH+FREE T4
Free T4: 1.43 ng/dL (ref 0.82–1.77)
TSH: 1.99 u[IU]/mL (ref 0.450–4.500)

## 2024-04-03 LAB — COMPREHENSIVE METABOLIC PANEL WITH GFR
ALT: 13 IU/L (ref 0–32)
AST: 18 IU/L (ref 0–40)
Albumin: 4.2 g/dL (ref 3.8–4.8)
Alkaline Phosphatase: 59 IU/L (ref 49–135)
BUN/Creatinine Ratio: 11 — ABNORMAL LOW (ref 12–28)
BUN: 10 mg/dL (ref 8–27)
Bilirubin Total: 0.5 mg/dL (ref 0.0–1.2)
CO2: 25 mmol/L (ref 20–29)
Calcium: 9.8 mg/dL (ref 8.7–10.3)
Chloride: 104 mmol/L (ref 96–106)
Creatinine, Ser: 0.9 mg/dL (ref 0.57–1.00)
Globulin, Total: 3.4 g/dL (ref 1.5–4.5)
Glucose: 125 mg/dL — ABNORMAL HIGH (ref 70–99)
Potassium: 3.9 mmol/L (ref 3.5–5.2)
Sodium: 142 mmol/L (ref 134–144)
Total Protein: 7.6 g/dL (ref 6.0–8.5)
eGFR: 67 mL/min/1.73 (ref >59)

## 2024-04-03 LAB — LIPID PANEL WITH LDL/HDL RATIO
Cholesterol, Total: 162 mg/dL (ref 100–199)
HDL: 75 mg/dL (ref >39)
LDL Chol Calc (NIH): 73 mg/dL (ref 0–99)
LDL/HDL Ratio: 1 ratio (ref 0.0–3.2)
Triglycerides: 72 mg/dL (ref 0–149)
VLDL Cholesterol Cal: 14 mg/dL (ref 5–40)

## 2024-04-05 ENCOUNTER — Encounter: Payer: Self-pay | Admitting: Physician Assistant

## 2024-04-05 ENCOUNTER — Ambulatory Visit: Payer: Medicare Other | Admitting: Physician Assistant

## 2024-04-05 VITALS — BP 130/70 | HR 97 | Temp 98.0°F | Resp 16 | Ht 61.0 in | Wt 147.0 lb

## 2024-04-05 DIAGNOSIS — E782 Mixed hyperlipidemia: Secondary | ICD-10-CM

## 2024-04-05 DIAGNOSIS — R208 Other disturbances of skin sensation: Secondary | ICD-10-CM

## 2024-04-05 DIAGNOSIS — Z0001 Encounter for general adult medical examination with abnormal findings: Secondary | ICD-10-CM | POA: Diagnosis not present

## 2024-04-05 DIAGNOSIS — I10 Essential (primary) hypertension: Secondary | ICD-10-CM | POA: Diagnosis not present

## 2024-04-05 DIAGNOSIS — E1165 Type 2 diabetes mellitus with hyperglycemia: Secondary | ICD-10-CM | POA: Diagnosis not present

## 2024-04-05 DIAGNOSIS — R300 Dysuria: Secondary | ICD-10-CM | POA: Diagnosis not present

## 2024-04-05 DIAGNOSIS — C50811 Malignant neoplasm of overlapping sites of right female breast: Secondary | ICD-10-CM

## 2024-04-05 DIAGNOSIS — Z170 Estrogen receptor positive status [ER+]: Secondary | ICD-10-CM

## 2024-04-05 NOTE — Progress Notes (Signed)
 [facility] [address] [place], [place] [address]  Internal MEDICINE  Office Visit Note  Patient Name: Abigail Wheeler  [id number]  [id number]  Date of Service: 04/05/2024  Chief Complaint  Patient presents with   Medicare Wellness   Diabetes   Hypertension   Leg Pain    Right leg irritation below the knee, pt describes burning sensation.   Quality Metric Gaps    TDAP, Pneumonia and Shingles vaccines    HPI Abigail Wheeler presents for an annual well visit  Well-appearing 73 y.o.female  Routine CRC screening: UTD Routine mammogram: UTD, scheduled for Jan Eye exam and/or foot exam: eye exam UTD, foot exam done today Labs: Glucose elevated, otherwise look good. BG in AM 120 typically but did get up to 140 once New or worsening pain: right leg pain, states she went to a walk in clinic and had xrays done in the last 6 months. Knee was bothersome too at the time. States told no clot? But only had xrays? Unable to see any records of this encounter. Some burning on outside of right lower leg intermittently. NO numbness or skin changes. No swelling. Put some topical on the other night and this seemed to help. Unsure what this is and will check. -thinks may be neuropathy from past cancer treatments Other concerns: Will work on getting vaccines scheduled -BP doing ok -foot exam sensation in tact     04/05/2024    1:35 PM 04/04/2023    9:52 AM 03/29/2022    9:50 AM  MMSE - Mini Mental State Exam  Orientation to time 5 5 5   Orientation to Place 5 5 5   Registration 3 3 3   Attention/ Calculation 5 5 5   Recall 3 3 3   Language- name 2 objects 2 2 2   Language- repeat 1 1 1   Language- follow 3 step command 3 3 3   Language- read & follow direction 1 1 1   Write a sentence 1 1 1   Copy design 1 1 1   Total score 30 30 30     Functional Status Survey: Is the patient deaf or have difficulty hearing?: No Does the patient have difficulty seeing, even when wearing  glasses/contacts?: No Does the patient have difficulty concentrating, remembering, or making decisions?: No Does the patient have difficulty walking or climbing stairs?: No Does the patient have difficulty dressing or bathing?: No Does the patient have difficulty doing errands alone such as visiting a doctor's office or shopping?: No     05/15/2022    1:23 PM 04/04/2023    9:52 AM 05/21/2023   11:24 AM 10/06/2023    8:39 AM 04/05/2024    1:35 PM  Fall Risk  Falls in the past year? 0 0 0 0 0  Was there an injury with Fall?   0     Fall Risk Category Calculator   0    Fall risk Follow up    Falls evaluation completed      Data saved with a previous flowsheet row definition       04/05/2024    1:35 PM  Depression screen PHQ 2/9  Decreased Interest 0  Down, Depressed, Hopeless 0  PHQ - 2 Score 0        No data to display            Current Medication: Outpatient Encounter Medications as of 04/05/2024  Medication Sig   acetaminophen (TYLENOL) 500 MG tablet Take 500 mg by mouth every 6 (six) hours  as needed.   alendronate  (FOSAMAX ) 70 MG tablet TAKE 1 TABLET (70 MG TOTAL) BY MOUTH ONCE A WEEK. TAKE WITH A FULL GLASS OF WATER ON AN EMPTY STOMACH.   Blood Glucose Monitoring Suppl (ONETOUCH VERIO) w/Device KIT Use as directed Dx e11.65   calcium  carbonate (OS-CAL) 600 MG TABS tablet Take 600 mg by mouth daily.   Cholecalciferol (VITAMIN D -3) 1000 UNITS CAPS Take 1 capsule by mouth daily.   glucose blood (ONETOUCH VERIO) test strip 1 each by Other route daily at 12 noon. Use as directed once a daily DX E11.65   Lancets (ONETOUCH DELICA PLUS LANCET30G) MISC Use as directed.   losartan  (COZAAR ) 50 MG tablet TAKE 1 TABLET BY MOUTH EVERY DAY   metFORMIN  (GLUCOPHAGE -XR) 500 MG 24 hr tablet TAKE 1 TABLET BY MOUTH EVERY DAY WITH BREAKFAST   rosuvastatin  (CRESTOR ) 5 MG tablet TAKE 1 TABLET BY MOUTH TWICE A WEEK   [DISCONTINUED] letrozole  (FEMARA ) 2.5 MG tablet TAKE ONE TABLET BY MOUTH  EVERY DAY   [DISCONTINUED] pyridoxine (B-6) 100 MG tablet Vitamin B-6 100 mg tablet  Take 1 tablet twice a day by oral route.   No facility-administered encounter medications on file as of 04/05/2024.    Surgical History: Past Surgical History:  Procedure Laterality Date   BREAST BIOPSY Right 04-05-13   positive   BREAST SURGERY Right 05/20/13   mastectomy   CESAREAN SECTION  1991   COLONOSCOPY WITH PROPOFOL  N/A 12/30/2018   Procedure: COLONOSCOPY WITH PROPOFOL ;  Surgeon: Abigail Keene NOVAK, MD;  Location: [facility] ENDOSCOPY;  Service: Endoscopy;  Laterality: N/A;   MASTECTOMY Right 2015   chemo and radiation    Medical History: Past Medical History:  Diagnosis Date   Arthritis    Breast cancer (HCC) 2015   chemo and radiation   Cancer (HCC) 05-20-13   T1 N1 ER positive/PR negative HER2 negative. right breast   Diabetes mellitus without complication (HCC)    GERD (gastroesophageal reflux disease)    Hypertension    PE (pulmonary embolism)    Personal history of chemotherapy 2015   Right breast   Personal history of radiation therapy 2015   Right breast   Renal calculi     Family History: Family History  Problem Relation Age of Onset   Lung cancer Father    Breast cancer Maternal Grandmother 19    Social History   Socioeconomic History   Marital status: Married    Spouse name: Not on file   Number of children: Not on file   Years of education: Not on file   Highest education level: Not on file  Occupational History   Not on file  Tobacco Use   Smoking status: Never    Passive exposure: Never   Smokeless tobacco: Never  Vaping Use   Vaping status: Never Used  Substance and Sexual Activity   Alcohol use: No   Drug use: No   Sexual activity: Yes  Other Topics Concern   Not on file  Social History Narrative   Not on file   Social Drivers of Health   Tobacco Use: Low Risk (04/05/2024)   Patient History    Smoking Tobacco Use: Never    Smokeless Tobacco  Use: Never    Passive Exposure: Never  Financial Resource Strain: Not on file  Food Insecurity: Not on file  Transportation Needs: Not on file  Physical Activity: Not on file  Stress: Not on file  Social Connections: Not on file  Intimate  Partner Violence: Not on file  Depression (PHQ2-9): Low Risk (04/05/2024)   Depression (PHQ2-9)    PHQ-2 Score: 0  Alcohol Screen: Not on file  Housing: Not on file  Utilities: Not on file  Health Literacy: Not on file      Review of Systems  Constitutional:  Negative for chills, diaphoresis and fatigue.  HENT:  Negative for ear pain, postnasal drip and sinus pressure.   Eyes:  Negative for photophobia, discharge, redness, itching and visual disturbance.  Respiratory:  Negative for cough, shortness of breath and wheezing.   Cardiovascular:  Negative for chest pain, palpitations and leg swelling.  Gastrointestinal:  Negative for abdominal pain, constipation, diarrhea, nausea and vomiting.  Genitourinary:  Negative for dysuria and flank pain.  Musculoskeletal:  Negative for back pain, gait problem and neck pain.       Burning pain on outside of right lower leg intermittently  Skin:  Negative for color change.  Allergic/Immunologic: Negative for environmental allergies and food allergies.  Neurological:  Negative for dizziness, numbness and headaches.  Hematological:  Does not bruise/bleed easily.  Psychiatric/Behavioral:  Negative for agitation, behavioral problems (depression) and hallucinations.     Vital Signs: BP 130/70 Comment: 124/58  Pulse 97   Temp 98 F (36.7 C)   Resp 16   Ht 5' 1 (1.549 m)   Wt 147 lb (66.7 kg)   SpO2 97%   BMI 27.78 kg/m    Physical Exam Vitals and nursing note reviewed.  Constitutional:      General: She is not in acute distress.    Appearance: She is well-developed. She is not diaphoretic.  HENT:     Head: Normocephalic and atraumatic.  Eyes:     Extraocular Movements: Extraocular movements  intact.  Neck:     Thyroid : No thyromegaly.     Vascular: No JVD.     Trachea: No tracheal deviation.  Cardiovascular:     Rate and Rhythm: Normal rate and regular rhythm.     Heart sounds: Normal heart sounds. No murmur heard.    No friction rub. No gallop.  Pulmonary:     Effort: Pulmonary effort is normal. No respiratory distress.     Breath sounds: No wheezing or rales.  Chest:     Chest wall: No tenderness.  Musculoskeletal:        General: No swelling or tenderness. Normal range of motion.     Right lower leg: No edema.     Left lower leg: No edema.     Comments: No tenderness to palpation, no sensation changes, skin changes, or swelling along right lower leg  Skin:    General: Skin is warm and dry.  Neurological:     General: No focal deficit present.     Mental Status: She is alert and oriented to person, place, and time.     Sensory: No sensory deficit.  Psychiatric:        Behavior: Behavior normal.        Thought Content: Thought content normal.        Judgment: Judgment normal.        Assessment/Plan: 1. Encounter for Medicare annual examination with abnormal findings (Primary) AVW performed, labs reviewed  2. Type 2 diabetes mellitus with hyperglycemia, without long-term current use of insulin (HCC) Will check labs for monitoring - Urine Microalbumin w/creat. ratio - Hgb A1C w/o eAG  3. Benign hypertension Well controlled  4. Mixed hyperlipidemia Stable, continue crestor   5. Burning sensation  of lower extremity Intermittent without any other symptoms. Will check B12 and A1c, may be neuropathy. Pt declines further testing at this time, but will call if she changes her mind or any new or worsening symptoms arise - B12 and Folate Panel  6. Malignant neoplasm of overlapping sites of right breast in female, estrogen receptor positive (HCC) Followed by oncology/GS  7. Dysuria - UA/M w/rflx Culture, Routine     General Counseling: Trenese  verbalizes understanding of the findings of todays visit and agrees with plan of treatment. I have discussed any further diagnostic evaluation that may be needed or ordered today. We also reviewed her medications today. she has been encouraged to call the office with any questions or concerns that should arise related to todays visit.    Orders Placed This Encounter  Procedures   UA/M w/rflx Culture, Routine   Urine Microalbumin w/creat. ratio   B12 and Folate Panel   Hgb A1C w/o eAG    No orders of the defined types were placed in this encounter.   Return in about 3 months (around 07/04/2024) for follow up on leg.   Total time spent:35 Minutes Time spent includes review of chart, medications, test results, and follow up plan with the patient.   [place] Controlled Substance Database was reviewed by me.  This patient was seen by Tinnie Pro, PA-C in collaboration with Dr. Sigrid Bathe as a part of collaborative care agreement.  Tinnie Pro, PA-C Internal medicine

## 2024-04-08 LAB — UA/M W/RFLX CULTURE, ROUTINE
Bilirubin, UA: NEGATIVE
Glucose, UA: NEGATIVE
Ketones, UA: NEGATIVE
Nitrite, UA: NEGATIVE
Protein,UA: NEGATIVE
RBC, UA: NEGATIVE
Specific Gravity, UA: 1.012 (ref 1.005–1.030)
Urobilinogen, Ur: 0.2 mg/dL (ref 0.2–1.0)
pH, UA: 8 — ABNORMAL HIGH (ref 5.0–7.5)

## 2024-04-08 LAB — MICROSCOPIC EXAMINATION
Bacteria, UA: NONE SEEN
Casts: NONE SEEN /LPF
RBC, Urine: NONE SEEN /HPF (ref 0–2)

## 2024-04-08 LAB — URINE CULTURE, REFLEX

## 2024-04-08 LAB — MICROALBUMIN / CREATININE URINE RATIO
Creatinine, Urine: 82.9 mg/dL
Microalb/Creat Ratio: 4 mg/g{creat} (ref 0–29)
Microalbumin, Urine: 3.4 ug/mL

## 2024-04-20 ENCOUNTER — Other Ambulatory Visit: Payer: Self-pay | Admitting: Physician Assistant

## 2024-04-20 DIAGNOSIS — I10 Essential (primary) hypertension: Secondary | ICD-10-CM

## 2024-05-07 ENCOUNTER — Encounter: Payer: Self-pay | Admitting: Oncology

## 2024-05-07 ENCOUNTER — Inpatient Hospital Stay: Payer: Medicare HMO | Attending: Oncology | Admitting: Oncology

## 2024-05-07 VITALS — BP 125/65 | HR 108 | Temp 98.3°F | Resp 19 | Ht 61.0 in | Wt 149.6 lb

## 2024-05-07 DIAGNOSIS — Z79899 Other long term (current) drug therapy: Secondary | ICD-10-CM | POA: Diagnosis not present

## 2024-05-07 DIAGNOSIS — M85852 Other specified disorders of bone density and structure, left thigh: Secondary | ICD-10-CM

## 2024-05-07 NOTE — Progress Notes (Signed)
 "    Hematology/Oncology Consult note [facility]  [phone] [phone]  Patient Care Team: Kristina Tinnie MARLA DEVONNA as PCP - General (Physician Assistant) Dellie Louanne MATSU, MD (General Surgery) Fernand Sigrid HERO, MD (Internal Medicine) Covington, Sarah M, PA-C as Physician Assistant (Emergency Medicine) Melanee Annah BROCKS, MD as Consulting Physician (Oncology)   Name of the patient: Abigail Wheeler  [id number]  1950/05/14   Date of visit: 05/07/24  Diagnosis-history of stage III breast cancer  Chief complaint/ Reason for visit-routine follow-up of breast cancer  Heme/Onc history: Patient is a 74 year old female who sees Dr. Rudell for her history of stage IIIb right breast cancer which was diagnosed in January 2015.  She is status post right mastectomy and sentinel lymph node biopsy.  Pathology showed grade 1 lobular carcinoma multifocal, largest focus was 1.7 cm invading the pectoralis muscle additional foci were 1.4 cm and 1 mm respectively.  Sentinel lymph node was also positive with extracapsular extension.  Tumor was ER PR positive and HER-2/neu negative.  Patient received dose dense AC followed by 12 weeks of Taxol.  She also received adjuvant radiation to her chest and axilla.  She then started taking Femara .  Patient also diagnosed with multiple left lower lobe pulmonary emboli in April 2015.  Hypercoagulable work-up was negative and patient was on Xarelto  but stopped taking it subsequently.  Bone density scan in May 2018 showed osteopenia with a score of -1.9.  Patient was not a candidate for FRAX score due to Femara . Patient completed 10 years of endocrine therapy in August 2025  Interval history- Abigail Wheeler is a 74 year old female with estrogen receptor-positive breast cancer, status post ten years of adjuvant Exemestane, who presents for routine oncology follow-up after discontinuation of endocrine therapy.  She completed ten  years of adjuvant Exemestane in August and is no longer receiving endocrine therapy. She is currently asymptomatic, denying breast pain, palpable masses, or nipple changes. She noted a minor area of concern while applying deodorant but did not report any specific symptoms. She is scheduled for a routine mammogram on Monday.  She reports feeling well overall, with stable appetite and weight. There are no new systemic symptoms or concerns related to her prior breast cancer diagnosis or treatment.  She continues weekly Fosamax  for osteopenia, which she has been taking for a little over two years.     ECOG PS- 1 Pain scale- 0  Review of systems- Review of Systems  Constitutional:  Negative for chills, fever, malaise/fatigue and weight loss.  HENT:  Negative for congestion, ear discharge and nosebleeds.   Eyes:  Negative for blurred vision.  Respiratory:  Negative for cough, hemoptysis, sputum production, shortness of breath and wheezing.   Cardiovascular:  Negative for chest pain, palpitations, orthopnea and claudication.  Gastrointestinal:  Negative for abdominal pain, blood in stool, constipation, diarrhea, heartburn, melena, nausea and vomiting.  Genitourinary:  Negative for dysuria, flank pain, frequency, hematuria and urgency.  Musculoskeletal:  Negative for back pain, joint pain and myalgias.  Skin:  Negative for rash.  Neurological:  Negative for dizziness, tingling, focal weakness, seizures, weakness and headaches.  Endo/Heme/Allergies:  Does not bruise/bleed easily.  Psychiatric/Behavioral:  Negative for depression and suicidal ideas. The patient does not have insomnia.       Allergies[1]   Past Medical History:  Diagnosis Date   Arthritis    Breast cancer (HCC) 2015   chemo and radiation   Cancer (HCC) 05-20-13   T1 N1 ER  positive/PR negative HER2 negative. right breast   Diabetes mellitus without complication (HCC)    GERD (gastroesophageal reflux disease)    Hypertension     PE (pulmonary embolism)    Personal history of chemotherapy 2015   Right breast   Personal history of radiation therapy 2015   Right breast   Renal calculi      Past Surgical History:  Procedure Laterality Date   BREAST BIOPSY Right 04-05-13   positive   BREAST SURGERY Right 05/20/13   mastectomy   CESAREAN SECTION  1991   COLONOSCOPY WITH PROPOFOL  N/A 12/30/2018   Procedure: COLONOSCOPY WITH PROPOFOL ;  Surgeon: Janalyn Keene NOVAK, MD;  Location: [facility] ENDOSCOPY;  Service: Endoscopy;  Laterality: N/A;   MASTECTOMY Right 2015   chemo and radiation    Social History   Socioeconomic History   Marital status: Married    Spouse name: Not on file   Number of children: Not on file   Years of education: Not on file   Highest education level: Not on file  Occupational History   Not on file  Tobacco Use   Smoking status: Never    Passive exposure: Never   Smokeless tobacco: Never  Vaping Use   Vaping status: Never Used  Substance and Sexual Activity   Alcohol use: No   Drug use: No   Sexual activity: Yes  Other Topics Concern   Not on file  Social History Narrative   Not on file   Social Drivers of Health   Tobacco Use: Low Risk (05/07/2024)   Patient History    Smoking Tobacco Use: Never    Smokeless Tobacco Use: Never    Passive Exposure: Never  Financial Resource Strain: Not on file  Food Insecurity: No Food Insecurity (05/07/2024)   Epic    Worried About Programme Researcher, Broadcasting/film/video in the Last Year: Never true    Ran Out of Food in the Last Year: Never true  Transportation Needs: No Transportation Needs (05/07/2024)   Epic    Lack of Transportation (Medical): No    Lack of Transportation (Non-Medical): No  Physical Activity: Not on file  Stress: Not on file  Social Connections: Not on file  Intimate Partner Violence: Not At Risk (05/07/2024)   Epic    Fear of Current or Ex-Partner: No    Emotionally Abused: No    Physically Abused: No    Sexually Abused: No   Depression (PHQ2-9): Low Risk (05/07/2024)   Depression (PHQ2-9)    PHQ-2 Score: 0  Alcohol Screen: Not on file  Housing: Low Risk (05/07/2024)   Epic    Unable to Pay for Housing in the Last Year: No    Number of Times Moved in the Last Year: 0    Homeless in the Last Year: No  Utilities: Not At Risk (05/07/2024)   Epic    Threatened with loss of utilities: No  Health Literacy: Not on file    Family History  Problem Relation Age of Onset   Lung cancer Father    Breast cancer Maternal Grandmother 33    Current Medications[2]  Physical exam:  Vitals:   05/07/24 1022  BP: 125/65  Pulse: (!) 108  Resp: 19  Temp: 98.3 F (36.8 C)  TempSrc: Tympanic  SpO2: 99%  Weight: 149 lb 9.6 oz (67.9 kg)  Height: 5' 1 (1.549 m)   Physical Exam Cardiovascular:     Rate and Rhythm: Normal rate and regular rhythm.  Heart sounds: Normal heart sounds.  Pulmonary:     Effort: Pulmonary effort is normal.     Breath sounds: Normal breath sounds.  Skin:    General: Skin is warm and dry.  Neurological:     Mental Status: She is alert and oriented to person, place, and time.   Breast exam: Patient is s/p right mastectomy without reconstruction.  No evidence of chest wall recurrence.  No palpable bilateral axillary adenopathy.  No palpable masses in the left breast  I have personally reviewed labs listed below:    Latest Ref Rng & Units 04/02/2024    9:34 AM  CMP  Glucose 70 - 99 mg/dL 874   BUN 8 - 27 mg/dL 10   Creatinine 9.42 - 1.00 mg/dL 9.09   Sodium 865 - 855 mmol/L 142   Potassium 3.5 - 5.2 mmol/L 3.9   Chloride 96 - 106 mmol/L 104   CO2 20 - 29 mmol/L 25   Calcium  8.7 - 10.3 mg/dL 9.8   Total Protein 6.0 - 8.5 g/dL 7.6   Total Bilirubin 0.0 - 1.2 mg/dL 0.5   Alkaline Phos 49 - 135 IU/L 59   AST 0 - 40 IU/L 18   ALT 0 - 32 IU/L 13       Latest Ref Rng & Units 04/02/2024    9:34 AM  CBC  WBC 3.4 - 10.8 x10E3/uL 4.8   Hemoglobin 11.1 - 15.9 g/dL 86.8   Hematocrit  65.9 - 46.6 % 40.1   Platelets 150 - 450 x10E3/uL 261    I have personally reviewed Radiology images listed below: No images are attached to the encounter.  No results found.   Assessment and plan- Patient is a 74 y.o. female with history of stage IIIb right breast cancer s/p mastectomy and adjuvant Femara  currently in remission here for routine follow-up  Assessment and Plan    Personal history of malignant neoplasm of breast - Clinically patient is doing well with no concerning signs and symptoms of recurrence based on today's exam Completed ten years of adjuvant endocrine therapy with Exemestane, discontinued in August. Asymptomatic, denies breast symptoms except minor issue during deodorant application. - Scheduled routine mammogram for Monday. - Continue annual follow-up visits.  Osteopenia Osteopenia managed with weekly Fosamax  for over two years. Therapy duration typically three to five years, reassessment planned post bone density scan. - Continue weekly Fosamax . - Reassess Fosamax  therapy at next annual visit based on bone density results.         Visit Diagnosis 1. Osteopenia of neck of left femur   2. High risk medication use      Dr. Annah Skene, MD, MPH [facility] [phone] 05/07/2024 3:58 PM                   [1] No Known Allergies [2]  Current Outpatient Medications:    acetaminophen (TYLENOL) 500 MG tablet, Take 500 mg by mouth every 6 (six) hours as needed., Disp: , Rfl:    alendronate  (FOSAMAX ) 70 MG tablet, TAKE 1 TABLET (70 MG TOTAL) BY MOUTH ONCE A WEEK. TAKE WITH A FULL GLASS OF WATER ON AN EMPTY STOMACH., Disp: 12 tablet, Rfl: 3   Blood Glucose Monitoring Suppl (ONETOUCH VERIO) w/Device KIT, Use as directed Dx e11.65, Disp: 1 kit, Rfl: 0   calcium  carbonate (OS-CAL) 600 MG TABS tablet, Take 600 mg by mouth daily., Disp: , Rfl:    Cholecalciferol (VITAMIN D -3) 1000 UNITS  CAPS, Take 1 capsule by mouth  daily., Disp: , Rfl:    glucose blood (ONETOUCH VERIO) test strip, 1 each by Other route daily at 12 noon. Use as directed once a daily DX E11.65, Disp: 100 each, Rfl: 3   Lancets (ONETOUCH DELICA PLUS LANCET30G) MISC, Use as directed., Disp: 100 each, Rfl: 2   losartan  (COZAAR ) 50 MG tablet, TAKE 1 TABLET BY MOUTH EVERY DAY, Disp: 90 tablet, Rfl: 3   metFORMIN  (GLUCOPHAGE -XR) 500 MG 24 hr tablet, TAKE 1 TABLET BY MOUTH EVERY DAY WITH BREAKFAST, Disp: 90 tablet, Rfl: 1   rosuvastatin  (CRESTOR ) 5 MG tablet, TAKE 1 TABLET BY MOUTH TWICE A WEEK, Disp: 24 tablet, Rfl: 3  "

## 2024-05-07 NOTE — Progress Notes (Signed)
 Patient doing good; no new or acute concerns at this time.

## 2024-05-10 ENCOUNTER — Ambulatory Visit
Admission: RE | Admit: 2024-05-10 | Discharge: 2024-05-10 | Disposition: A | Source: Ambulatory Visit | Attending: Surgery

## 2024-05-10 DIAGNOSIS — Z1231 Encounter for screening mammogram for malignant neoplasm of breast: Secondary | ICD-10-CM | POA: Insufficient documentation

## 2024-05-17 ENCOUNTER — Ambulatory Visit: Admitting: Surgery

## 2024-05-19 ENCOUNTER — Other Ambulatory Visit: Payer: Self-pay | Admitting: Physician Assistant

## 2024-05-19 DIAGNOSIS — I10 Essential (primary) hypertension: Secondary | ICD-10-CM

## 2024-05-19 MED ORDER — LOSARTAN POTASSIUM 50 MG PO TABS: 50.0000 mg | ORAL_TABLET | Freq: Every day | ORAL | 3 refills | Status: AC

## 2024-05-27 ENCOUNTER — Other Ambulatory Visit: Payer: Self-pay | Admitting: Physician Assistant

## 2024-05-27 DIAGNOSIS — E1165 Type 2 diabetes mellitus with hyperglycemia: Secondary | ICD-10-CM

## 2024-05-27 NOTE — Progress Notes (Signed)
 Abigail Wheeler                                          MRN: [id number]   05/27/2024   The [facility] Quality Team Specialist reviewed this patient medical record for the purposes of chart review for care gap closure. The following were reviewed: abstraction for care gap closure-kidney health evaluation for diabetes:eGFR  and uACR.    [facility] Quality Team

## 2024-06-02 ENCOUNTER — Ambulatory Visit: Admitting: Surgery

## 2024-07-05 ENCOUNTER — Ambulatory Visit: Admitting: Physician Assistant

## 2024-10-07 ENCOUNTER — Other Ambulatory Visit

## 2025-04-07 ENCOUNTER — Ambulatory Visit: Admitting: Physician Assistant

## 2025-05-06 ENCOUNTER — Inpatient Hospital Stay: Admitting: Oncology
